# Patient Record
Sex: Female | Born: 1935 | Race: White | Hispanic: No | Smoking: Never smoker
Health system: Southern US, Community
[De-identification: ages and names within clinical notes are randomized; demographics above are authoritative.]

## PROBLEM LIST (undated history)

## (undated) DIAGNOSIS — R350 Frequency of micturition: Secondary | ICD-10-CM

## (undated) DIAGNOSIS — F329 Major depressive disorder, single episode, unspecified: Secondary | ICD-10-CM

## (undated) DIAGNOSIS — M1990 Unspecified osteoarthritis, unspecified site: Secondary | ICD-10-CM

## (undated) DIAGNOSIS — Z87442 Personal history of urinary calculi: Secondary | ICD-10-CM

## (undated) DIAGNOSIS — I639 Cerebral infarction, unspecified: Secondary | ICD-10-CM

## (undated) DIAGNOSIS — F32A Depression, unspecified: Secondary | ICD-10-CM

## (undated) DIAGNOSIS — D649 Anemia, unspecified: Secondary | ICD-10-CM

## (undated) DIAGNOSIS — M797 Fibromyalgia: Secondary | ICD-10-CM

## (undated) DIAGNOSIS — I499 Cardiac arrhythmia, unspecified: Secondary | ICD-10-CM

## (undated) DIAGNOSIS — N200 Calculus of kidney: Secondary | ICD-10-CM

## (undated) DIAGNOSIS — F419 Anxiety disorder, unspecified: Secondary | ICD-10-CM

## (undated) DIAGNOSIS — J189 Pneumonia, unspecified organism: Secondary | ICD-10-CM

## (undated) DIAGNOSIS — T4145XA Adverse effect of unspecified anesthetic, initial encounter: Secondary | ICD-10-CM

## (undated) DIAGNOSIS — R000 Tachycardia, unspecified: Secondary | ICD-10-CM

## (undated) DIAGNOSIS — C50912 Malignant neoplasm of unspecified site of left female breast: Secondary | ICD-10-CM

## (undated) DIAGNOSIS — I4891 Unspecified atrial fibrillation: Secondary | ICD-10-CM

## (undated) DIAGNOSIS — I10 Essential (primary) hypertension: Secondary | ICD-10-CM

## (undated) DIAGNOSIS — T8859XA Other complications of anesthesia, initial encounter: Secondary | ICD-10-CM

## (undated) DIAGNOSIS — R7303 Prediabetes: Secondary | ICD-10-CM

## (undated) DIAGNOSIS — E785 Hyperlipidemia, unspecified: Secondary | ICD-10-CM

## (undated) HISTORY — PX: WRIST SURGERY: SHX841

## (undated) HISTORY — PX: KIDNEY STONE SURGERY: SHX686

## (undated) HISTORY — DX: Hyperlipidemia, unspecified: E78.5

## (undated) HISTORY — PX: COLONOSCOPY: SHX174

---
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
## 1994-10-20 HISTORY — PX: ABDOMINAL HYSTERECTOMY: SHX81

## 1998-07-25 ENCOUNTER — Other Ambulatory Visit: Admission: RE | Admit: 1998-07-25 | Discharge: 1998-07-25 | Payer: Self-pay | Admitting: Cardiology

## 1999-03-15 ENCOUNTER — Emergency Department (HOSPITAL_COMMUNITY): Admission: EM | Admit: 1999-03-15 | Discharge: 1999-03-15 | Payer: Self-pay | Admitting: Emergency Medicine

## 1999-03-15 ENCOUNTER — Encounter: Payer: Self-pay | Admitting: Emergency Medicine

## 1999-03-19 ENCOUNTER — Emergency Department (HOSPITAL_COMMUNITY): Admission: EM | Admit: 1999-03-19 | Discharge: 1999-03-19 | Payer: Self-pay | Admitting: Emergency Medicine

## 1999-04-10 ENCOUNTER — Encounter: Payer: Self-pay | Admitting: Neurosurgery

## 1999-04-10 ENCOUNTER — Ambulatory Visit (HOSPITAL_COMMUNITY): Admission: RE | Admit: 1999-04-10 | Discharge: 1999-04-10 | Payer: Self-pay | Admitting: Neurosurgery

## 1999-07-31 ENCOUNTER — Other Ambulatory Visit: Admission: RE | Admit: 1999-07-31 | Discharge: 1999-07-31 | Payer: Self-pay | Admitting: Cardiology

## 1999-10-01 ENCOUNTER — Ambulatory Visit (HOSPITAL_COMMUNITY): Admission: RE | Admit: 1999-10-01 | Discharge: 1999-10-01 | Payer: Self-pay | Admitting: Neurosurgery

## 1999-10-01 ENCOUNTER — Encounter: Payer: Self-pay | Admitting: Neurosurgery

## 2000-03-25 ENCOUNTER — Ambulatory Visit (HOSPITAL_COMMUNITY): Admission: RE | Admit: 2000-03-25 | Discharge: 2000-03-25 | Payer: Self-pay | Admitting: Neurosurgery

## 2000-03-25 ENCOUNTER — Encounter: Payer: Self-pay | Admitting: Neurosurgery

## 2001-03-29 ENCOUNTER — Encounter: Payer: Self-pay | Admitting: Neurosurgery

## 2001-03-29 ENCOUNTER — Ambulatory Visit (HOSPITAL_COMMUNITY): Admission: RE | Admit: 2001-03-29 | Discharge: 2001-03-29 | Payer: Self-pay | Admitting: Neurosurgery

## 2002-06-06 ENCOUNTER — Ambulatory Visit (HOSPITAL_COMMUNITY): Admission: RE | Admit: 2002-06-06 | Discharge: 2002-06-06 | Payer: Self-pay | Admitting: Neurosurgery

## 2002-06-06 ENCOUNTER — Encounter: Payer: Self-pay | Admitting: Neurosurgery

## 2003-11-09 ENCOUNTER — Ambulatory Visit (HOSPITAL_COMMUNITY): Admission: RE | Admit: 2003-11-09 | Discharge: 2003-11-09 | Payer: Self-pay | Admitting: Gastroenterology

## 2004-11-20 HISTORY — PX: KNEE SURGERY: SHX244

## 2005-02-06 ENCOUNTER — Emergency Department: Payer: Self-pay | Admitting: Unknown Physician Specialty

## 2009-12-11 DIAGNOSIS — D649 Anemia, unspecified: Secondary | ICD-10-CM | POA: Insufficient documentation

## 2009-12-11 DIAGNOSIS — F419 Anxiety disorder, unspecified: Secondary | ICD-10-CM | POA: Insufficient documentation

## 2009-12-11 DIAGNOSIS — J309 Allergic rhinitis, unspecified: Secondary | ICD-10-CM | POA: Insufficient documentation

## 2009-12-11 DIAGNOSIS — K449 Diaphragmatic hernia without obstruction or gangrene: Secondary | ICD-10-CM | POA: Insufficient documentation

## 2009-12-11 DIAGNOSIS — M797 Fibromyalgia: Secondary | ICD-10-CM | POA: Insufficient documentation

## 2009-12-11 DIAGNOSIS — I10 Essential (primary) hypertension: Secondary | ICD-10-CM | POA: Insufficient documentation

## 2010-01-07 DIAGNOSIS — E876 Hypokalemia: Secondary | ICD-10-CM | POA: Insufficient documentation

## 2010-01-07 DIAGNOSIS — R7301 Impaired fasting glucose: Secondary | ICD-10-CM | POA: Insufficient documentation

## 2010-08-01 DIAGNOSIS — Z2839 Other underimmunization status: Secondary | ICD-10-CM | POA: Insufficient documentation

## 2011-12-08 DIAGNOSIS — I480 Paroxysmal atrial fibrillation: Secondary | ICD-10-CM | POA: Insufficient documentation

## 2012-04-26 DIAGNOSIS — E785 Hyperlipidemia, unspecified: Secondary | ICD-10-CM | POA: Insufficient documentation

## 2012-04-26 DIAGNOSIS — R809 Proteinuria, unspecified: Secondary | ICD-10-CM | POA: Insufficient documentation

## 2012-04-26 DIAGNOSIS — M109 Gout, unspecified: Secondary | ICD-10-CM | POA: Insufficient documentation

## 2013-06-21 DIAGNOSIS — M899 Disorder of bone, unspecified: Secondary | ICD-10-CM | POA: Insufficient documentation

## 2015-02-22 DIAGNOSIS — I129 Hypertensive chronic kidney disease with stage 1 through stage 4 chronic kidney disease, or unspecified chronic kidney disease: Secondary | ICD-10-CM | POA: Insufficient documentation

## 2016-05-05 DIAGNOSIS — Z0000 Encounter for general adult medical examination without abnormal findings: Secondary | ICD-10-CM | POA: Insufficient documentation

## 2016-05-07 ENCOUNTER — Other Ambulatory Visit: Payer: Self-pay | Admitting: Radiology

## 2016-05-07 HISTORY — PX: BREAST BIOPSY: SHX20

## 2016-05-12 DIAGNOSIS — D0512 Intraductal carcinoma in situ of left breast: Secondary | ICD-10-CM | POA: Insufficient documentation

## 2016-05-16 ENCOUNTER — Other Ambulatory Visit: Payer: Self-pay | Admitting: General Surgery

## 2016-05-20 ENCOUNTER — Encounter: Payer: Self-pay | Admitting: Hematology and Oncology

## 2016-05-20 ENCOUNTER — Telehealth: Payer: Self-pay | Admitting: Hematology and Oncology

## 2016-05-20 NOTE — Telephone Encounter (Signed)
Appointment with Lindi Adie on 8/10 at 345pm. Patient agreed and made aware to arrive 30 minutes early. Demographics verified. Letter to the referring and mailed to the patient.

## 2016-05-21 ENCOUNTER — Other Ambulatory Visit: Payer: Self-pay

## 2016-05-23 ENCOUNTER — Telehealth: Payer: Self-pay | Admitting: *Deleted

## 2016-05-23 ENCOUNTER — Encounter: Payer: Self-pay | Admitting: Radiation Oncology

## 2016-05-23 NOTE — Progress Notes (Signed)
Location of Breast Cancer: left breast DCIS  Histology per Pathology Report:   05/07/16   Receptor Status: ER(95%), PR (80%), Her2-neu (), Ki-()  Did patient present with symptoms (if so, please note symptoms) or was this found on screening mammography?: screening mammogram  Past/Anticipated interventions by surgeon, if any: plans for left breast radioactive seed localized lumpectomy and possible sentinel node mapping,   Past/Anticipated interventions by medical oncology, if any: has appointment with Dr. Lindi Adie on 05/29/16  Lymphedema issues, if any:  no  Pain issues, if any:  no   OB Gyn history:  Patient was 13 years with first menses, 23 years with birth of first child, she has 2 children, 1 deceased.  She has not used bcp or hormone replacement.  SAFETY ISSUES:  Prior radiation? no  Pacemaker/ICD? no  Possible current pregnancy?no  Is the patient on methotrexate? no  Current Complaints / other details:  Patient is here with her husband.  BP (!) 160/84 (BP Location: Right Arm, Patient Position: Sitting)   Pulse (!) 55   Temp 98.2 F (36.8 C) (Oral)   Ht '5\' 3"'  (1.6 m)   Wt 165 lb (74.8 kg)   SpO2 98%   BMI 29.23 kg/m    Wt Readings from Last 3 Encounters:  05/26/16 165 lb (74.8 kg)      Jacqulyn Liner, RN 05/23/2016,9:03 AM

## 2016-05-23 NOTE — Telephone Encounter (Signed)
Received appt date/time from Andrea.  Mailed new pt packet to pt.  

## 2016-05-26 ENCOUNTER — Ambulatory Visit
Admission: RE | Admit: 2016-05-26 | Discharge: 2016-05-26 | Disposition: A | Discharge status: Home / Self Care | Payer: Medicare Other | Source: Ambulatory Visit | Attending: Radiation Oncology | Admitting: Radiation Oncology

## 2016-05-26 ENCOUNTER — Encounter: Payer: Self-pay | Admitting: Radiation Oncology

## 2016-05-26 VITALS — BP 160/84 | HR 55 | Temp 98.2°F | Ht 63.0 in | Wt 165.0 lb

## 2016-05-26 DIAGNOSIS — Z170 Estrogen receptor positive status [ER+]: Secondary | ICD-10-CM | POA: Insufficient documentation

## 2016-05-26 DIAGNOSIS — I4891 Unspecified atrial fibrillation: Secondary | ICD-10-CM | POA: Insufficient documentation

## 2016-05-26 DIAGNOSIS — C50212 Malignant neoplasm of upper-inner quadrant of left female breast: Secondary | ICD-10-CM

## 2016-05-26 DIAGNOSIS — Z7901 Long term (current) use of anticoagulants: Secondary | ICD-10-CM | POA: Diagnosis not present

## 2016-05-26 HISTORY — DX: Malignant neoplasm of unspecified site of left female breast: C50.912

## 2016-05-26 HISTORY — DX: Unspecified atrial fibrillation: I48.91

## 2016-05-26 NOTE — Progress Notes (Signed)
Radiation Oncology         [phone] ________________________________  Initial Outpatient Consultation  Name: Kathryn Ayala MRN: [id number]  Date: 05/26/2016  DOB: [birth year]  [id number], MD  Jovita Kussmaul, MD   REFERRING PHYSICIAN: Autumn Messing III, MD  DIAGNOSIS: The encounter diagnosis was Malignant neoplasm of upper-inner quadrant of left female breast [facility]).   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 female who had a screening mammogram on 04/30/16 that noted grouped punctate calcifications in the upper inner aspect the left breast. Diagnostic mammogram on 05/02/16 noted grouped calcifications measuring 3.2 cm in the left breast at the 12 o'clock position posterior depth. There was architectural distortion associated with the calcifications. Biopsy of the left breast on 05/07/16 revealed DCIS with calcifications (ER 95% positive, PR 80% positive). The patient and her husband present today to discuss the role of radiation in the management of her disease.  PREVIOUS RADIATION THERAPY: No  PAST MEDICAL HISTORY:  has a past medical history of Atrial fibrillation ([place]) and Breast cancer, left ([place]).    PAST SURGICAL HISTORY: Past Surgical History:  Procedure Laterality Date  . ABDOMINAL HYSTERECTOMY  1996  . APPENDECTOMY  1956  . BREAST BIOPSY Left 05/07/2016  . BREAST BIOPSY Bilateral 1956, 1980  . KNEE SURGERY  11/20/2004    FAMILY HISTORY: family history includes Lung cancer in her brother.  SOCIAL HISTORY:  reports that she has never smoked. She has never used smokeless tobacco. She reports that she does not drink alcohol or use drugs.  ALLERGIES: Codeine; Levaquin [levofloxacin]; and Penicillins  MEDICATIONS:  Current Outpatient Prescriptions  Medication Sig Dispense Refill  . allopurinol (ZYLOPRIM) 100 MG tablet Take 100 mg by mouth daily.   3  . atenolol (TENORMIN) 50 MG tablet Take 50 mg  by mouth 2 (two) times daily.  2  . benazepril (LOTENSIN) 10 MG tablet Take 5 mg by mouth daily.   3  . digoxin (LANOXIN) 0.125 MG tablet Take 0.125 mg by mouth daily.    Marland Kitchen ELIQUIS 5 MG TABS tablet Take 5 mg by mouth 2 (two) times daily.  11  . FLUoxetine (PROZAC) 20 MG capsule Take 20 mg by mouth daily.  2  . rosuvastatin (CRESTOR) 20 MG tablet Take 20 mg by mouth daily.  6  . vitamin C (ASCORBIC ACID) 500 MG tablet Take 500 mg by mouth daily.    . Biotin 10 MG CAPS Take by mouth.    . cholecalciferol (VITAMIN D) 1000 units tablet Take 1,000 Units by mouth daily.    . Omega-3 Fatty Acids (FISH OIL) 1000 MG CAPS Take 2 capsules by mouth daily.     No current facility-administered medications for this encounter.     REVIEW OF SYSTEMS:  A 15 point review of systems is documented in the electronic medical record. This was obtained by the nursing staff. However, I reviewed this with the patient to discuss relevant findings and make appropriate changes.  Pertinent items noted in HPI and remainder of comprehensive ROS otherwise negative.   PHYSICAL EXAM:  height is 5\' 3"  (1.6 m) and weight is 165 lb (74.8 kg). Her oral temperature is 98.2 F (36.8 C). Her blood pressure is 160/84 (abnormal) and her pulse is 55 (abnormal). Her oxygen saturation is 98%.   General: Alert and oriented, in no acute distress HEENT: Head is normocephalic. Extraocular movements are intact. Oropharynx is  clear. Neck: Neck is supple, no palpable cervical or supraclavicular lymphadenopathy. Heart: Regular in rate and rhythm with no murmurs, rubs, or gallops. Chest: Clear to auscultation bilaterally, with no rhonchi, wheezes, or rales. Abdomen: Soft, nontender, nondistended, with no rigidity or guarding. Extremities: No cyanosis or edema. Lymphatics: see Neck Exam Skin: No concerning lesions. Musculoskeletal: symmetric strength and muscle tone throughout. Neurologic: Cranial nerves II through XII are grossly intact. No  obvious focalities. Speech is fluent. Coordination is intact. Psychiatric: Judgment and insight are intact. Affect is appropriate. Breast Exam: She has a faint scar in the UOQ of the left breast from a previous benign biopsy. No dominant mass appreciated in the breast. No nipple discharge or bleeding. Right breast shows a feint scar in the UOQ from a prior benign biopsy.  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 The patient would be a good candidate for breast conservation therapy with a partial mastectomy and likely radiation therapy, given the high grade nature of her disease.  PLAN: I spoke to the patient today regarding her diagnosis and options for treatment. We discussed the equivalence in terms of survival and local failure between mastectomy and breast conservation. We discussed the role of radiation in decreasing local failures in patients who undergo lumpectomy. We discussed the process of simulation and the placement tattoos. We discussed 4-6 weeks of treatment as an outpatient. We discussed the possibility of asymptomatic lung damage. We discussed the low likelihood of secondary malignancies. We discussed the possible side effects including but not limited to skin redness, fatigue, permanent skin darkening, and breast swelling. The patient is interested in breast conservation surgery rather than a mastectomy.  The patient is scheduled to see Dr. Lindi Adie on 05/29/16 and she will get cardiac clearance from her cardiologist. The patient will proceed with surgery and will be seen in a post-operative setting to discuss treatment recommendations.     ------------------------------------------------  Blair Promise, PhD, MD  This document serves  as a record of services personally performed by Gery Pray, MD. It was created on his behalf by Darcus Austin, a trained medical scribe. The creation of this record is based on the scribe's personal observations and the provider's statements to them. This document has been checked and approved by the attending provider.

## 2016-05-26 NOTE — Addendum Note (Signed)
Encounter addended by: Jacqulyn Liner, RN on: 05/26/2016  1:30 PM<BR>    Actions taken: Charge Capture section accepted

## 2016-05-26 NOTE — Progress Notes (Signed)
Please see the Nurse Progress Note in the MD Initial Consult Encounter for this patient. 

## 2016-05-29 ENCOUNTER — Encounter: Payer: Self-pay | Admitting: *Deleted

## 2016-05-29 ENCOUNTER — Ambulatory Visit (HOSPITAL_BASED_OUTPATIENT_CLINIC_OR_DEPARTMENT_OTHER): Payer: Medicare Other | Admitting: Hematology and Oncology

## 2016-05-29 ENCOUNTER — Encounter: Payer: Self-pay | Admitting: Hematology and Oncology

## 2016-05-29 DIAGNOSIS — Z170 Estrogen receptor positive status [ER+]: Secondary | ICD-10-CM | POA: Diagnosis not present

## 2016-05-29 DIAGNOSIS — D0512 Intraductal carcinoma in situ of left breast: Secondary | ICD-10-CM | POA: Diagnosis not present

## 2016-05-29 DIAGNOSIS — C50212 Malignant neoplasm of upper-inner quadrant of left female breast: Secondary | ICD-10-CM

## 2016-05-29 NOTE — Assessment & Plan Note (Signed)
Left breast biopsy 05/07/2016: DCIS with calcifications, high-grade, ER 95%, PR 80%, Tis N0 stage 0 There are 3.2 cm grouped calcifications in the left breast at 12:00 posterior depth. There is architectural distortion.  Pathology review: I discussed with the patient the difference between DCIS and invasive breast cancer. It is considered a precancerous lesion. DCIS is classified as a 0. It is generally detected through mammograms as calcifications. We discussed the significance of grades and its impact on prognosis. We also discussed the importance of ER and PR receptors and their implications to adjuvant treatment options. Prognosis of DCIS dependence on grade, comedo necrosis. It is anticipated that if not treated, 20-30% of DCIS can develop into invasive breast cancer.  Recommendation: 1. Breast conserving surgery 2. Followed by adjuvant radiation therapy 3. Followed by antiestrogen therapy with tamoxifen 5 years  Tamoxifen counseling: We discussed the risks and benefits of tamoxifen. These include but not limited to insomnia, hot flashes, mood changes, vaginal dryness, and weight gain. Although rare, serious side effects including endometrial cancer, risk of blood clots were also discussed. We strongly believe that the benefits far outweigh the risks. Patient understands these risks and consented to starting treatment. Planned treatment duration is 5 years.  Return to clinic after surgery to discuss the final pathology report and come up with an adjuvant treatment plan.

## 2016-05-29 NOTE — Progress Notes (Signed)
[facility] CONSULT NOTE  Patient Care Team: Crist Infante, MD as PCP - General (Internal Medicine)  CHIEF COMPLAINTS/PURPOSE OF CONSULTATION:  Newly diagnosed left breast DCIS  HISTORY OF PRESENTING ILLNESS:  Kathryn Ayala Alias 81 y.o. female is here because of recent diagnosis of left breast DCIS. The patient had a routine screening mammogram the detected abnormality in the left breast is 3.2 cm grouped calcifications with architectural distortion. This was evaluated by ultrasound and biopsy was performed. The result came back as DCIS with calcifications high-grade that was ER/PR positive. She was seen by Dr. Marlou Starks who is scheduling her for surgery. She was sent to [place] for discussion regarding adjuvant treatment options. She is here today accompanied by her husband and her niece.  I reviewed her records extensively and collaborated the history with the patient.  SUMMARY OF ONCOLOGIC HISTORY:   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[place])   05/02/2016 Mammogram    There are 3.2 cm grouped calcifications in the left breast at 12:00 posterior depth. There is architectural distortion.     05/07/2016 Initial Diagnosis    Left breast biopsy: DCIS with calcifications, high-grade, ER 95%, PR 80%, Tis N0 stage 0      MEDICAL HISTORY:  Past Medical History:  Diagnosis Date  . Atrial fibrillation ([place])   . Breast cancer, left [facility])     SURGICAL HISTORY: Past Surgical History:  Procedure Laterality Date  . ABDOMINAL HYSTERECTOMY  1996  . APPENDECTOMY  1956  . BREAST BIOPSY Left 05/07/2016  . BREAST BIOPSY Bilateral 1956, 1980  . KNEE SURGERY  11/20/2004    SOCIAL HISTORY: Social History   Social History  . Marital status: Married    Spouse name: N/A  . Number of children: 2  . Years of education: N/A   Occupational History  . Not on file.   Social History Main Topics  . Smoking status: Never Smoker  . Smokeless tobacco: Never Used  .  Alcohol use No  . Drug use: No  . Sexual activity: Not on file   Other Topics Concern  . Not on file   Social History Narrative  . No narrative on file    FAMILY HISTORY: Family History  Problem Relation Age of Onset  . Lung cancer Brother     ALLERGIES:  is allergic to codeine; levaquin [levofloxacin]; and penicillins.  MEDICATIONS:  Current Outpatient Prescriptions  Medication Sig Dispense Refill  . allopurinol (ZYLOPRIM) 100 MG tablet Take 100 mg by mouth daily.   3  . atenolol (TENORMIN) 50 MG tablet Take 50 mg by mouth 2 (two) times daily.  2  . benazepril (LOTENSIN) 10 MG tablet Take 5 mg by mouth daily.   3  . Biotin 10 MG CAPS Take by mouth.    . cholecalciferol (VITAMIN D) 1000 units tablet Take 1,000 Units by mouth daily.    . digoxin (LANOXIN) 0.125 MG tablet Take 0.125 mg by mouth daily.    Marland Kitchen ELIQUIS 5 MG TABS tablet Take 5 mg by mouth 2 (two) times daily.  11  . FLUoxetine (PROZAC) 20 MG capsule Take 20 mg by mouth daily.  2  . Omega-3 Fatty Acids (FISH OIL) 1000 MG CAPS Take 2 capsules by mouth daily.    . rosuvastatin (CRESTOR) 20 MG tablet Take 20 mg by mouth daily.  6  . vitamin C (ASCORBIC ACID) 500 MG tablet Take 500 mg by mouth daily.     No  current facility-administered medications for this visit.     REVIEW OF SYSTEMS:   Constitutional: Denies fevers, chills or abnormal night sweats Eyes: Denies blurriness of vision, double vision or watery eyes Ears, nose, mouth, throat, and face: Denies mucositis or sore throat Respiratory: Denies cough, dyspnea or wheezes Cardiovascular: Denies palpitation, chest discomfort or lower extremity swelling Gastrointestinal:  Denies nausea, heartburn or change in bowel habits Skin: Denies abnormal skin rashes Lymphatics: Denies new lymphadenopathy or easy bruising Neurological:Denies numbness, tingling or new weaknesses Behavioral/Psych: Mood is stable, no new changes  Breast:  Denies any palpable lumps or  discharge All other systems were reviewed with the patient and are negative.  PHYSICAL EXAMINATION: ECOG PERFORMANCE STATUS: 0 - Asymptomatic  There were no vitals filed for this visit. There were no vitals filed for this visit.  GENERAL:alert, no distress and comfortable SKIN: skin color, texture, turgor are normal, no rashes or significant lesions EYES: normal, conjunctiva are pink and non-injected, sclera clear OROPHARYNX:no exudate, no erythema and lips, buccal mucosa, and tongue normal  NECK: supple, thyroid normal size, non-tender, without nodularity LYMPH:  no palpable lymphadenopathy in the cervical, axillary or inguinal LUNGS: clear to auscultation and percussion with normal breathing effort HEART: regular rate & rhythm and no murmurs and no lower extremity edema ABDOMEN:abdomen soft, non-tender and normal bowel sounds Musculoskeletal:no cyanosis of digits and no clubbing  PSYCH: alert & oriented x 3 with fluent speech NEURO: no focal motor/sensory deficits BREAST: No palpable nodules in breast. No palpable axillary or supraclavicular lymphadenopathy (exam performed in the presence of a chaperone)   RADIOGRAPHIC STUDIES: I have personally reviewed the radiological reports and agreed with the findings in the report.  ASSESSMENT AND PLAN:  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[place]) Left breast biopsy 05/07/2016: DCIS with calcifications, high-grade, ER 95%, PR 80%, Tis N0 stage 0 There are 3.2 cm grouped calcifications in the left breast at 12:00 posterior depth. There is architectural distortion.  Pathology review: I discussed with the patient the difference between DCIS and invasive breast cancer. It is considered a precancerous lesion. DCIS is classified as a 0. It is generally detected through mammograms as calcifications. We discussed the significance of grades and its impact on prognosis. We also discussed the importance of ER and PR receptors  and their implications to adjuvant treatment options. Prognosis of DCIS dependence on grade, comedo necrosis. It is anticipated that if not treated, 20-30% of DCIS can develop into invasive breast cancer.  Recommendation: 1. Breast conserving surgery 2. +/- adjuvant radiation therapy 3. +/- antiestrogen therapy with tamoxifen 5 years  Tamoxifen counseling: We discussed the risks and benefits of tamoxifen. These include but not limited to insomnia, hot flashes, mood changes, vaginal dryness, and weight gain. Although rare, serious side effects including  risk of blood clots were also discussed. W  Prognosis: With surgery alone the risk of recurrence is a 10% over 10 years. With both radiation and hormonal therapy the risk would be 2%. With radiation alone the risk would be 4%. With hormone therapy alone the risk would be 5%. Once she completed surgery then we will discuss the pros and cons of doing either radiation or hormone therapy. I anticipate that she may decide to receive one of these 2 treatments and not both.  Return to clinic after surgery to discuss the final pathology report and come up with an adjuvant treatment plan.  All questions were answered. The patient knows to call the clinic with  any problems, questions or concerns.    Rulon Eisenmenger, MD 05/29/16

## 2016-06-03 ENCOUNTER — Telehealth: Payer: Self-pay | Admitting: Hematology and Oncology

## 2016-06-03 NOTE — Telephone Encounter (Signed)
Spoke with pt to confirm 9/11 appt at 11 am per [facility] LOS

## 2016-06-10 ENCOUNTER — Encounter (HOSPITAL_COMMUNITY)
Admission: RE | Admit: 2016-06-10 | Discharge: 2016-06-10 | Disposition: A | Discharge status: Home / Self Care | Payer: Medicare Other | Source: Ambulatory Visit | Attending: General Surgery | Admitting: General Surgery

## 2016-06-10 ENCOUNTER — Encounter (HOSPITAL_COMMUNITY): Payer: Self-pay

## 2016-06-10 ENCOUNTER — Other Ambulatory Visit (HOSPITAL_COMMUNITY): Payer: Self-pay | Admitting: *Deleted

## 2016-06-10 DIAGNOSIS — D0512 Intraductal carcinoma in situ of left breast: Secondary | ICD-10-CM | POA: Diagnosis not present

## 2016-06-10 DIAGNOSIS — Z01812 Encounter for preprocedural laboratory examination: Secondary | ICD-10-CM | POA: Diagnosis present

## 2016-06-10 HISTORY — DX: Anxiety disorder, unspecified: F41.9

## 2016-06-10 HISTORY — DX: Cerebral infarction, unspecified: I63.9

## 2016-06-10 HISTORY — DX: Frequency of micturition: R35.0

## 2016-06-10 HISTORY — DX: Adverse effect of unspecified anesthetic, initial encounter: T41.45XA

## 2016-06-10 HISTORY — DX: Other complications of anesthesia, initial encounter: T88.59XA

## 2016-06-10 HISTORY — DX: Anemia, unspecified: D64.9

## 2016-06-10 HISTORY — DX: Major depressive disorder, single episode, unspecified: F32.9

## 2016-06-10 HISTORY — DX: Pneumonia, unspecified organism: J18.9

## 2016-06-10 HISTORY — DX: Unspecified osteoarthritis, unspecified site: M19.90

## 2016-06-10 HISTORY — DX: Depression, unspecified: F32.A

## 2016-06-10 HISTORY — DX: Essential (primary) hypertension: I10

## 2016-06-10 HISTORY — DX: Calculus of kidney: N20.0

## 2016-06-10 LAB — BASIC METABOLIC PANEL
Anion gap: 8 (ref 5–15)
BUN: 16 mg/dL (ref 6–20)
CHLORIDE: 108 mmol/L (ref 101–111)
CO2: 22 mmol/L (ref 22–32)
CREATININE: 0.84 mg/dL (ref 0.44–1.00)
Calcium: 9.7 mg/dL (ref 8.9–10.3)
GFR calc Af Amer: >60 mL/min (ref >60)
GFR calc non Af Amer: >60 mL/min (ref >60)
GLUCOSE: 122 mg/dL — AB (ref 65–99)
POTASSIUM: 3.7 mmol/L (ref 3.5–5.1)
SODIUM: 138 mmol/L (ref 135–145)

## 2016-06-10 LAB — CBC
HEMATOCRIT: 44.9 % (ref 36.0–46.0)
Hemoglobin: 14.2 g/dL (ref 12.0–15.0)
MCH: 29.7 pg (ref 26.0–34.0)
MCHC: 31.6 g/dL (ref 30.0–36.0)
MCV: 93.9 fL (ref 78.0–100.0)
PLATELETS: 175 10*3/uL (ref 150–400)
RBC: 4.78 MIL/uL (ref 3.87–5.11)
RDW: 14.8 % (ref 11.5–15.5)
WBC: 6.7 10*3/uL (ref 4.0–10.5)

## 2016-06-10 NOTE — Pre-Procedure Instructions (Signed)
Kathryn Ayala  06/10/2016     Your procedure is scheduled on Monday, June 16, 2016 at 10:05 AM.   Report to [facility] Entrance "A" Admitting Office at 8:00 AM.   Call this number if you have problems the morning of surgery: [phone]   Questions prior to day of surgery, please call [phone] between 8 & 4 PM.   Remember:  Do not eat food or drink liquids after midnight Sunday, 06/15/16.  Take these medicines the morning of surgery with A SIP OF WATER: Atenolol (Tenormin), Digoxin (Lanoxin), Fluoxetine (Prozac)  Stop Eliquis as instructed by physician.   Do not wear jewelry, make-up or nail polish.  Do not wear lotions, powders, or perfumes.  You may NOT wear deodorant.  Do not shave 48 hours prior to surgery.   Do not bring valuables to the hospital.  [facility] is not responsible for any belongings or valuables.  Contacts, dentures or bridgework may not be worn into surgery.  Leave your suitcase in the car.  After surgery it may be brought to your room.  For patients admitted to the hospital, discharge time will be determined by your treatment team.  Patients discharged the day of surgery will not be allowed to drive home.   Special instructions:  [place] - Preparing for Surgery  Before surgery, you can play an important role.  Because skin is not sterile, your skin needs to be as free of germs as possible.  You can reduce the number of germs on you skin by washing with CHG (chlorahexidine gluconate) soap before surgery.  CHG is an antiseptic cleaner which kills germs and bonds with the skin to continue killing germs even after washing.  Please DO NOT use if you have an allergy to CHG or antibacterial soaps.  If your skin becomes reddened/irritated stop using the CHG and inform your nurse when you arrive at Short Stay.  Do not shave (including legs and underarms) for at least 48 hours prior to the first CHG shower.  You may shave your face.  Please  follow these instructions carefully:   1.  Shower with CHG Soap the night before surgery and the                    morning of Surgery.  2.  If you choose to wash your hair, wash your hair first as usual with your       normal shampoo.  3.  After you shampoo, rinse your hair and body thoroughly to remove the shampoo.  4.  Use CHG as you would any other liquid soap.  You can apply chg directly       to the skin and wash gently with scrungie or a clean washcloth.  5.  Apply the CHG Soap to your body ONLY FROM THE NECK DOWN.        Do not use on open wounds or open sores.  Avoid contact with your eyes, ears, mouth and genitals (private parts).  Wash genitals (private parts) with your normal soap.  6.  Wash thoroughly, paying special attention to the area where your surgery        will be performed.  7.  Thoroughly rinse your body with warm water from the neck down.  8.  DO NOT shower/wash with your normal soap after using and rinsing off       the CHG Soap.  9.  Pat yourself dry with a  clean towel.            10.  Wear clean pajamas.            11.  Place clean sheets on your bed the night of your first shower and do not        sleep with pets.  Day of Surgery  Do not apply any lotions/deodorants the morning of surgery.  Please wear clean clothes to the hospital.   Please read over the following fact sheets that you were given. Pain Booklet, Coughing and Deep Breathing and Surgical Site Infection Prevention

## 2016-06-10 NOTE — Progress Notes (Signed)
Pt has hx of A-fib, cardiologist is Dr. Einar Gip. She denies any recent chest pain or sob. Have requested EKG and last office visit notes from Dr. Irven Shelling office. No stress test or echo has been done per office staff.

## 2016-06-16 ENCOUNTER — Ambulatory Visit (HOSPITAL_COMMUNITY): Payer: Medicare Other | Admitting: Certified Registered"

## 2016-06-16 ENCOUNTER — Encounter (HOSPITAL_COMMUNITY)
Admission: RE | Disposition: A | Discharge status: Home / Self Care | Payer: Self-pay | Source: Ambulatory Visit | Attending: General Surgery

## 2016-06-16 ENCOUNTER — Ambulatory Visit (HOSPITAL_COMMUNITY): Payer: Medicare Other | Admitting: Vascular Surgery

## 2016-06-16 ENCOUNTER — Ambulatory Visit (HOSPITAL_COMMUNITY)
Admission: RE | Admit: 2016-06-16 | Discharge: 2016-06-16 | Disposition: A | Discharge status: Home / Self Care | Payer: Medicare Other | Source: Ambulatory Visit | Attending: General Surgery | Admitting: General Surgery

## 2016-06-16 ENCOUNTER — Encounter (HOSPITAL_COMMUNITY): Payer: Self-pay | Admitting: *Deleted

## 2016-06-16 DIAGNOSIS — Z79899 Other long term (current) drug therapy: Secondary | ICD-10-CM | POA: Diagnosis not present

## 2016-06-16 DIAGNOSIS — I4891 Unspecified atrial fibrillation: Secondary | ICD-10-CM | POA: Insufficient documentation

## 2016-06-16 DIAGNOSIS — N6092 Unspecified benign mammary dysplasia of left breast: Secondary | ICD-10-CM | POA: Diagnosis not present

## 2016-06-16 DIAGNOSIS — Z7901 Long term (current) use of anticoagulants: Secondary | ICD-10-CM | POA: Insufficient documentation

## 2016-06-16 DIAGNOSIS — D0512 Intraductal carcinoma in situ of left breast: Secondary | ICD-10-CM | POA: Diagnosis present

## 2016-06-16 DIAGNOSIS — I10 Essential (primary) hypertension: Secondary | ICD-10-CM | POA: Insufficient documentation

## 2016-06-16 HISTORY — PX: BREAST LUMPECTOMY WITH RADIOACTIVE SEED LOCALIZATION: SHX6424

## 2016-06-16 LAB — PROTIME-INR
INR: 1.04
Prothrombin Time: 13.7 seconds (ref 11.4–15.2)

## 2016-06-16 SURGERY — BREAST LUMPECTOMY WITH RADIOACTIVE SEED LOCALIZATION
Anesthesia: General | Site: Breast | Laterality: Left

## 2016-06-16 MED ORDER — METOCLOPRAMIDE HCL 5 MG/ML IJ SOLN: 10.0000 mg | Freq: Once | INTRAMUSCULAR | Status: DC | PRN

## 2016-06-16 MED ORDER — DEXAMETHASONE SODIUM PHOSPHATE 4 MG/ML IJ SOLN
INTRAMUSCULAR | Status: DC | PRN
  Administered 2016-06-16: 10 mg via INTRAVENOUS

## 2016-06-16 MED ORDER — EPHEDRINE SULFATE 50 MG/ML IJ SOLN
INTRAMUSCULAR | Status: DC | PRN
  Administered 2016-06-16: 5 mg via INTRAVENOUS

## 2016-06-16 MED ORDER — CHLORHEXIDINE GLUCONATE CLOTH 2 % EX PADS: 6.0000 | MEDICATED_PAD | Freq: Once | CUTANEOUS | Status: DC

## 2016-06-16 MED ORDER — FENTANYL CITRATE (PF) 100 MCG/2ML IJ SOLN
INTRAMUSCULAR | Status: AC
  Filled 2016-06-16: qty 2

## 2016-06-16 MED ORDER — EPHEDRINE 5 MG/ML INJ
INTRAVENOUS | Status: AC
  Filled 2016-06-16: qty 10

## 2016-06-16 MED ORDER — LACTATED RINGERS IV SOLN
INTRAVENOUS | Status: DC
  Administered 2016-06-16 (×2): via INTRAVENOUS

## 2016-06-16 MED ORDER — ONDANSETRON HCL 4 MG/2ML IJ SOLN
INTRAMUSCULAR | Status: DC | PRN
  Administered 2016-06-16: 4 mg via INTRAVENOUS

## 2016-06-16 MED ORDER — DEXAMETHASONE SODIUM PHOSPHATE 10 MG/ML IJ SOLN
INTRAMUSCULAR | Status: AC
  Filled 2016-06-16: qty 1

## 2016-06-16 MED ORDER — GLYCOPYRROLATE 0.2 MG/ML IV SOSY
PREFILLED_SYRINGE | INTRAVENOUS | Status: AC
  Filled 2016-06-16: qty 3

## 2016-06-16 MED ORDER — GLYCOPYRROLATE 0.2 MG/ML IJ SOLN
INTRAMUSCULAR | Status: DC | PRN
  Administered 2016-06-16: 0.2 mg via INTRAVENOUS

## 2016-06-16 MED ORDER — 0.9 % SODIUM CHLORIDE (POUR BTL) OPTIME
TOPICAL | Status: DC | PRN
  Administered 2016-06-16: 1000 mL

## 2016-06-16 MED ORDER — PROPOFOL 10 MG/ML IV BOLUS
INTRAVENOUS | Status: DC | PRN
  Administered 2016-06-16: 130 mg via INTRAVENOUS

## 2016-06-16 MED ORDER — ONDANSETRON HCL 4 MG/2ML IJ SOLN
INTRAMUSCULAR | Status: AC
  Filled 2016-06-16: qty 2

## 2016-06-16 MED ORDER — PROPOFOL 10 MG/ML IV BOLUS
INTRAVENOUS | Status: AC
  Filled 2016-06-16: qty 20

## 2016-06-16 MED ORDER — BUPIVACAINE-EPINEPHRINE 0.25% -1:200000 IJ SOLN
INTRAMUSCULAR | Status: DC | PRN
  Administered 2016-06-16: 10 mL

## 2016-06-16 MED ORDER — FENTANYL CITRATE (PF) 100 MCG/2ML IJ SOLN
25.0000 ug | INTRAMUSCULAR | Status: DC | PRN
  Administered 2016-06-16: 50 ug via INTRAVENOUS

## 2016-06-16 MED ORDER — FENTANYL CITRATE (PF) 100 MCG/2ML IJ SOLN
INTRAMUSCULAR | Status: DC | PRN
Start: 2016-06-16 — End: 2016-06-16
  Administered 2016-06-16 (×4): 50 ug via INTRAVENOUS

## 2016-06-16 MED ORDER — VANCOMYCIN HCL IN DEXTROSE 1-5 GM/200ML-% IV SOLN
INTRAVENOUS | Status: AC
  Filled 2016-06-16: qty 200

## 2016-06-16 MED ORDER — VANCOMYCIN HCL 1000 MG IV SOLR
INTRAVENOUS | Status: DC | PRN
  Administered 2016-06-16: 1000 mg via INTRAVENOUS

## 2016-06-16 MED ORDER — TRAMADOL HCL 50 MG PO TABS: 50.0000 mg | ORAL_TABLET | Freq: Four times a day (QID) | ORAL | 0 refills | Status: DC | PRN

## 2016-06-16 MED ORDER — BUPIVACAINE-EPINEPHRINE (PF) 0.25% -1:200000 IJ SOLN
INTRAMUSCULAR | Status: AC
  Filled 2016-06-16: qty 30

## 2016-06-16 MED ORDER — HYDROCODONE-ACETAMINOPHEN 5-325 MG PO TABS: 1.0000 | ORAL_TABLET | ORAL | 0 refills | Status: DC | PRN

## 2016-06-16 MED ORDER — PHENYLEPHRINE 40 MCG/ML (10ML) SYRINGE FOR IV PUSH (FOR BLOOD PRESSURE SUPPORT)
PREFILLED_SYRINGE | INTRAVENOUS | Status: AC
  Filled 2016-06-16: qty 20

## 2016-06-16 MED ORDER — MIDAZOLAM HCL 2 MG/2ML IJ SOLN
INTRAMUSCULAR | Status: AC
  Filled 2016-06-16: qty 2

## 2016-06-16 MED ORDER — LIDOCAINE HCL (CARDIAC) 20 MG/ML IV SOLN
INTRAVENOUS | Status: DC | PRN
  Administered 2016-06-16: 100 mg via INTRAVENOUS

## 2016-06-16 MED ORDER — MEPERIDINE HCL 25 MG/ML IJ SOLN: 6.2500 mg | INTRAMUSCULAR | Status: DC | PRN

## 2016-06-16 MED ORDER — CEFAZOLIN SODIUM-DEXTROSE 2-4 GM/100ML-% IV SOLN
INTRAVENOUS | Status: AC
  Filled 2016-06-16: qty 100

## 2016-06-16 MED ORDER — PHENYLEPHRINE HCL 10 MG/ML IJ SOLN
INTRAMUSCULAR | Status: DC | PRN
  Administered 2016-06-16: 120 ug via INTRAVENOUS
  Administered 2016-06-16 (×2): 80 ug via INTRAVENOUS

## 2016-06-16 SURGICAL SUPPLY — 41 items
APPLIER CLIP 9.375 MED OPEN (MISCELLANEOUS)
BINDER BREAST LRG (GAUZE/BANDAGES/DRESSINGS) IMPLANT
BINDER BREAST XLRG (GAUZE/BANDAGES/DRESSINGS) IMPLANT
BLADE SURG 15 STRL LF DISP TIS (BLADE) ×1 IMPLANT
BLADE SURG 15 STRL SS (BLADE) ×1
CANISTER SUCTION 2500CC (MISCELLANEOUS) ×2 IMPLANT
CHLORAPREP W/TINT 26ML (MISCELLANEOUS) ×2 IMPLANT
CLIP APPLIE 9.375 MED OPEN (MISCELLANEOUS) IMPLANT
COVER PROBE W GEL 5X96 (DRAPES) ×2 IMPLANT
COVER SURGICAL LIGHT HANDLE (MISCELLANEOUS) ×2 IMPLANT
DEVICE DUBIN SPECIMEN MAMMOGRA (MISCELLANEOUS) ×2 IMPLANT
DRAPE CHEST BREAST 15X10 FENES (DRAPES) ×2 IMPLANT
DRAPE UTILITY XL STRL (DRAPES) ×2 IMPLANT
ELECT COATED BLADE 2.86 ST (ELECTRODE) ×2 IMPLANT
ELECT REM PT RETURN 9FT ADLT (ELECTROSURGICAL) ×2
ELECTRODE REM PT RTRN 9FT ADLT (ELECTROSURGICAL) ×1 IMPLANT
GLOVE BIO SURGEON STRL SZ 6 (GLOVE) ×2 IMPLANT
GLOVE BIO SURGEON STRL SZ 6.5 (GLOVE) ×4 IMPLANT
GLOVE BIO SURGEON STRL SZ7.5 (GLOVE) ×2 IMPLANT
GLOVE BIOGEL PI IND STRL 7.0 (GLOVE) ×2 IMPLANT
GLOVE BIOGEL PI INDICATOR 7.0 (GLOVE) ×2
GLOVE ECLIPSE 6.0 STRL STRAW (GLOVE) ×2 IMPLANT
GOWN STRL REUS W/ TWL LRG LVL3 (GOWN DISPOSABLE) ×2 IMPLANT
GOWN STRL REUS W/TWL LRG LVL3 (GOWN DISPOSABLE) ×2
KIT BASIN OR (CUSTOM PROCEDURE TRAY) ×2 IMPLANT
KIT MARKER MARGIN INK (KITS) ×2 IMPLANT
LIQUID BAND (GAUZE/BANDAGES/DRESSINGS) ×2 IMPLANT
NEEDLE HYPO 25X1 1.5 SAFETY (NEEDLE) ×2 IMPLANT
NS IRRIG 1000ML POUR BTL (IV SOLUTION) ×2 IMPLANT
PACK SURGICAL SETUP 50X90 (CUSTOM PROCEDURE TRAY) ×2 IMPLANT
PENCIL BUTTON HOLSTER BLD 10FT (ELECTRODE) ×2 IMPLANT
SPONGE LAP 18X18 X RAY DECT (DISPOSABLE) ×2 IMPLANT
SUT MNCRL AB 4-0 PS2 18 (SUTURE) ×4 IMPLANT
SUT SILK 2 0 SH (SUTURE) IMPLANT
SUT VIC AB 3-0 SH 18 (SUTURE) ×2 IMPLANT
SYR BULB 3OZ (MISCELLANEOUS) ×2 IMPLANT
SYR CONTROL 10ML LL (SYRINGE) ×2 IMPLANT
TOWEL OR 17X24 6PK STRL BLUE (TOWEL DISPOSABLE) ×2 IMPLANT
TOWEL OR 17X26 10 PK STRL BLUE (TOWEL DISPOSABLE) ×2 IMPLANT
TUBE CONNECTING 12X1/4 (SUCTIONS) ×2 IMPLANT
YANKAUER SUCT BULB TIP NO VENT (SUCTIONS) ×2 IMPLANT

## 2016-06-16 NOTE — Anesthesia Procedure Notes (Signed)
Procedure Name: LMA Insertion Date/Time: 06/16/2016 9:51 AM Performed by: Huey Romans ANN Pre-anesthesia Checklist: Patient identified, Emergency Drugs available, Suction available and Patient being monitored Patient Re-evaluated:Patient Re-evaluated prior to inductionOxygen Delivery Method: Circle System Utilized Preoxygenation: Pre-oxygenation with 100% oxygen Intubation Type: IV induction Ventilation: Mask ventilation without difficulty LMA: LMA inserted LMA Size: 4.0 Number of attempts: 1 Airway Equipment and Method: Bite block Placement Confirmation: positive ETCO2 Tube secured with: Tape Dental Injury: Teeth and Oropharynx as per pre-operative assessment

## 2016-06-16 NOTE — Anesthesia Preprocedure Evaluation (Signed)
Anesthesia Evaluation  Patient identified by MRN, date of birth, ID band Patient awake    Reviewed: Allergy & Precautions, NPO status , Patient's Chart, lab work & pertinent test results  Airway Mallampati: II  TM Distance: >3 FB Neck ROM: Full    Dental no notable dental hx.    Pulmonary neg pulmonary ROS,    Pulmonary exam normal breath sounds clear to auscultation       Cardiovascular hypertension, Pt. on medications and Pt. on home beta blockers Normal cardiovascular exam+ dysrhythmias Atrial Fibrillation  Rhythm:Regular Rate:Normal     Neuro/Psych CVA negative neurological ROS  negative psych ROS   GI/Hepatic negative GI ROS, Neg liver ROS,   Endo/Other  negative endocrine ROS  Renal/GU negative Renal ROS  negative genitourinary   Musculoskeletal negative musculoskeletal ROS (+)   Abdominal   Peds negative pediatric ROS (+)  Hematology negative hematology ROS (+)   Anesthesia Other Findings   Reproductive/Obstetrics negative OB ROS                            Anesthesia Physical Anesthesia Plan  ASA: III  Anesthesia Plan: General   Post-op Pain Management:    Induction: Intravenous  Airway Management Planned: Oral ETT and LMA  Additional Equipment:   Intra-op Plan:   Post-operative Plan: Extubation in OR  Informed Consent: I have reviewed the patients History and Physical, chart, labs and discussed the procedure including the risks, benefits and alternatives for the proposed anesthesia with the patient or authorized representative who has indicated his/her understanding and acceptance.   Dental advisory given  Plan Discussed with: CRNA  Anesthesia Plan Comments:         Anesthesia Quick Evaluation

## 2016-06-16 NOTE — Transfer of Care (Signed)
Immediate Anesthesia Transfer of Care Note  Patient: Kathryn Ayala  Procedure(s) Performed: Procedure(s): LEFT BREAST LUMPECTOMY WITH RADIOACTIVE SEED LOCALIZATION (Left)  Patient Location: PACU  Anesthesia Type:General  Level of Consciousness: awake, alert  and oriented  Airway & Oxygen Therapy: Patient Spontanous Breathing and Patient connected to face mask oxygen  Post-op Assessment: Report given to RN and Post -op Vital signs reviewed and stable  Post vital signs: Reviewed and stable  Last Vitals:  Vitals:   06/16/16 0859  BP: (!) 171/67  Pulse: (!) 56  Resp: 18  Temp: 36.8 C    Last Pain:  Vitals:   06/16/16 0859  TempSrc: Oral      Patients Stated Pain Goal: 3 ([id number])  Complications: No apparent anesthesia complications

## 2016-06-16 NOTE — Op Note (Signed)
06/16/2016  10:42 AM  PATIENT:  Kathryn Ayala  81 y.o. female  PRE-OPERATIVE DIAGNOSIS:  LEFT BREAST DCIS  POST-OPERATIVE DIAGNOSIS:  LEFT BREAST DCIS  PROCEDURE:  Procedure(s): LEFT BREAST LUMPECTOMY WITH RADIOACTIVE SEED LOCALIZATION (Left)  SURGEON:  Surgeon(s) and Role:    * Jovita Kussmaul, MD - Primary    * Clovis Riley, MD - Assisting  PHYSICIAN ASSISTANT:   ASSISTANTS: Dr. Kae Heller   ANESTHESIA:   general  EBL:  Total I/O In: -  Out: 5 [Blood:5]  BLOOD ADMINISTERED:none  DRAINS: none   LOCAL MEDICATIONS USED:  MARCAINE     SPECIMEN:  Source of Specimen:  left breast tissue  DISPOSITION OF SPECIMEN:  PATHOLOGY  COUNTS:  YES  TOURNIQUET:  * No tourniquets in log *  DICTATION: .Dragon Dictation   After informed consent was obtained the patient was brought to the operating room and placed in the supine position on the operating room table. After adequate induction of general anesthesia the patient's left breast was prepped with ChloraPrep, allowed to dry, and draped in usual sterile manner. An appropriate timeout was performed. Previously an I-125 seed was placed in the upper inner quadrant of the left breast to mark an area of ductal carcinoma in situ. The neoprobe was set to I-125 in the area of radioactivity was readily identified in the upper portion of the left breast. An elliptical incision was made with a 15 blade knife overlying the area of radioactivity. The incision was carried through the skin and subcutaneous tissue sharply with the electrocautery. While checking the area of radioactivity frequently with the neoprobe a circular portion of breast tissue was excised sharply around the radioactive seed. This dissection was carried all the way to the chest wall.Once the specimen was removed it was oriented with the appropriate paint colors. Of note the blue paint was placed on the superior surface and the red paint was placed on the inferior surface. A specimen  radiograph was obtained that showed the clip and seed to be in the center of the specimen with the calcifications. The specimen was then sent to pathology for further evaluation. Hemostasis was achieved using the Bovie electrocautery. An additional inferior margin was taken and marked with a stitch on the new true surgical margin. This was done for some abnormal feeling tissue at the inferior margin. The cavity was then marked with clips. The wound was irrigated with copious amounts of saline and infiltrated with 0.25% percent Marcaine. The deep layer of the wound Was then closed with interrupted 3-0 Vicryl stitches. The skin was closed with interrupted 4-0 Monocryl subcuticular stitches. Dermabond dressings were applied. The patient tolerated the procedure well. At the end of the case all needle sponge and instrument counts were correct. The patient was then awakened and taken to recovery in stable condition.  PLAN OF CARE: Discharge to home after PACU  PATIENT DISPOSITION:  PACU - hemodynamically stable.   Delay start of Pharmacological VTE agent (>24hrs) due to surgical blood loss or risk of bleeding: not applicable

## 2016-06-16 NOTE — Anesthesia Postprocedure Evaluation (Signed)
Anesthesia Post Note  Patient: Kathryn Ayala  Procedure(s) Performed: Procedure(s) (LRB): LEFT BREAST LUMPECTOMY WITH RADIOACTIVE SEED LOCALIZATION (Left)  Patient location during evaluation: PACU Anesthesia Type: General Level of consciousness: awake and alert Pain management: pain level controlled Vital Signs Assessment: post-procedure vital signs reviewed and stable Respiratory status: spontaneous breathing, nonlabored ventilation, respiratory function stable and patient connected to nasal cannula oxygen Cardiovascular status: blood pressure returned to baseline and stable Postop Assessment: no signs of nausea or vomiting Anesthetic complications: no    Last Vitals:  Vitals:   06/16/16 1151 06/16/16 1210  BP: (!) 175/94 (!) 178/69  Pulse: 65 64  Resp: 15 18  Temp:      Last Pain:  Vitals:   06/16/16 1120  TempSrc:   PainSc: 7                  Tiajuana Amass

## 2016-06-16 NOTE — H&P (Signed)
Doyle Askew  Location: [facility] Patient #: [id number] DOB: 1935-05-08 Married / Language: English / Race: White Female   History of Present Illness  The patient is a 81 year old female who presents with breast cancer. We are asked to see the patient in consultation by Dr. Christene Slates to evaluate her for a left breast ductal carcinoma in situ. The patient is a 81 year old white female who recently went for a routine screening mammogram. At that time she was found to have and small area of abnormal calcification in the upper portion of the left breast. This was biopsied and came back as high-grade ductal carcinoma in situ. It was ER and PR positive. She denies any breast pain or discharge from the nipple. She has no personal or family history of breast cancer. She does have a history of irregular heart rate and is followed by Dr. Einar Gip. She will also need to be off the Eliquis prior to surgery.   Past Surgical History Hysterectomy (not due to cancer) - Partial  Allergies Levaquin *FLUOROQUINOLONES* PenicillAMINE *MISCELLANEOUS THERAPEUTIC CLASSES* Codeine Phosphate *ANALGESICS - OPIOID*  Medication History Eliquis (5MG  Tablet, Oral) Active. Atenolol (100MG  Tablet, Oral) Active. Allopurinol (100MG  Tablet, Oral) Active. Benazepril HCl (5MG  Tablet, Oral) Active. PROzac (20MG  Capsule, Oral) Active. Medications Reconciled  Pregnancy / Birth History  Age at menarche 8 years.    Review of Systems  General Not Present- Appetite Loss, Chills, Fatigue, Fever, Night Sweats, Weight Gain and Weight Loss. Skin Not Present- Change in Wart/Mole, Dryness, Hives, Jaundice, New Lesions, Non-Healing Wounds, Rash and Ulcer. HEENT Not Present- Earache, Hearing Loss, Hoarseness, Nose Bleed, Oral Ulcers, Ringing in the Ears, Seasonal Allergies, Sinus Pain, Sore Throat, Visual Disturbances, Wears glasses/contact lenses and Yellow Eyes. Respiratory Not Present- Bloody  sputum, Chronic Cough, Difficulty Breathing, Snoring and Wheezing. Breast Not Present- Breast Mass, Breast Pain, Nipple Discharge and Skin Changes. Cardiovascular Not Present- Chest Pain, Difficulty Breathing Lying Down, Leg Cramps, Palpitations, Rapid Heart Rate, Shortness of Breath and Swelling of Extremities. Gastrointestinal Not Present- Abdominal Pain, Bloating, Bloody Stool, Change in Bowel Habits, Chronic diarrhea, Constipation, Difficulty Swallowing, Excessive gas, Gets full quickly at meals, Hemorrhoids, Indigestion, Nausea, Rectal Pain and Vomiting. Female Genitourinary Not Present- Frequency, Nocturia, Painful Urination, Pelvic Pain and Urgency. Musculoskeletal Not Present- Back Pain, Joint Pain, Joint Stiffness, Muscle Pain, Muscle Weakness and Swelling of Extremities. Neurological Not Present- Decreased Memory, Fainting, Headaches, Numbness, Seizures, Tingling, Tremor, Trouble walking and Weakness. Psychiatric Not Present- Anxiety, Bipolar, Change in Sleep Pattern, Depression, Fearful and Frequent crying. Endocrine Not Present- Cold Intolerance, Excessive Hunger, Hair Changes, Heat Intolerance, Hot flashes and New Diabetes. Hematology Not Present- Easy Bruising, Excessive bleeding, Gland problems, HIV and Persistent Infections.  Vitals Weight: 165 lb Height: 63in Body Surface Area: 1.78 m Body Mass Index: 29.23 kg/m  Temp.: 98.78F(Temporal)  Pulse: 77 (Regular)  BP: 128/86 (Sitting, Left Arm, Standard)       Physical Exam General Mental Status-Alert. General Appearance-Consistent with stated age. Hydration-Well hydrated. Voice-Normal.  Head and Neck Head-normocephalic, atraumatic with no lesions or palpable masses. Trachea-midline. Thyroid Gland Characteristics - normal size and consistency.  Eye Eyeball - Bilateral-Extraocular movements intact. Sclera/Conjunctiva - Bilateral-No scleral icterus.  Chest and Lung Exam Chest and lung  exam reveals -quiet, even and easy respiratory effort with no use of accessory muscles and on auscultation, normal breath sounds, no adventitious sounds and normal vocal resonance. Inspection Chest Wall - Normal. Back - normal.  Breast Note: There are well-healed scars  on both breasts with some nodularity associated with the scars. Other than this there is no palpable mass in either breast. There is no palpable axillary, cervical, or cervical lymphadenopathy.   Cardiovascular Cardiovascular examination reveals -normal heart sounds, regular rate and rhythm with no murmurs and normal pedal pulses bilaterally.  Abdomen Inspection Inspection of the abdomen reveals - No Hernias. Skin - Scar - no surgical scars. Palpation/Percussion Palpation and Percussion of the abdomen reveal - Soft, Non Tender, No Rebound tenderness, No Rigidity (guarding) and No hepatosplenomegaly. Auscultation Auscultation of the abdomen reveals - Bowel sounds normal.  Neurologic Neurologic evaluation reveals -alert and oriented x 3 with no impairment of recent or remote memory. Mental Status-Normal.  Musculoskeletal Normal Exam - Left-Upper Extremity Strength Normal and Lower Extremity Strength Normal. Normal Exam - Right-Upper Extremity Strength Normal and Lower Extremity Strength Normal.  Lymphatic Head & Neck  General Head & Neck Lymphatics: Bilateral - Description - Normal. Axillary  General Axillary Region: Bilateral - Description - Normal. Tenderness - Non Tender. Femoral & Inguinal  Generalized Femoral & Inguinal Lymphatics: Bilateral - Description - Normal. Tenderness - Non Tender.    Assessment & Plan  DUCTAL CARCINOMA IN SITU (DCIS) OF LEFT BREAST (D05.12) Impression: The patient appears to have a small area of ductal carcinoma in situ in the upper portion of the left breast. I have talked her in detail about the different options for treatment and at this point she favors breast  conservation. I think this is a very reasonable way of getting rid of her breast cancer. I would plan for a left breast radioactive seed localized lumpectomy.  I have discussed with her in detail the risks and benefits of the operation as well as some of the technical aspects and she understands and wishes to proceed. I will also plan to get cardiac clearance from her cardiologist giving her history of irregular heartbeat Current Plans Referred to Oncology, for evaluation and follow up (Oncology). Routine. Pt Education - Breast Cancer: discussed with patient and provided information.

## 2016-06-16 NOTE — Interval H&P Note (Signed)
History and Physical Interval Note:  06/16/2016 9:18 AM  Kathryn Ayala  has presented today for surgery, with the diagnosis of LEFT BREAST DCIS  The various methods of treatment have been discussed with the patient and family. After consideration of risks, benefits and other options for treatment, the patient has consented to  Procedure(s): LEFT BREAST LUMPECTOMY WITH RADIOACTIVE SEED LOCALIZATION (Left) as a surgical intervention .  The patient's history has been reviewed, patient examined, no change in status, stable for surgery.  I have reviewed the patient's chart and labs.  Questions were answered to the patient's satisfaction.     TOTH III,Jatavius Ellenwood S

## 2016-06-17 ENCOUNTER — Encounter (HOSPITAL_COMMUNITY): Payer: Self-pay | Admitting: General Surgery

## 2016-06-27 ENCOUNTER — Telehealth: Payer: Self-pay | Admitting: Hematology and Oncology

## 2016-06-27 NOTE — Telephone Encounter (Signed)
Lvm to inform pt of r/s appt per [facility] due to  upcoming weather condition

## 2016-06-30 ENCOUNTER — Ambulatory Visit: Payer: Medicare Other | Admitting: Hematology and Oncology

## 2016-06-30 NOTE — Progress Notes (Signed)
Location of Breast Cancer: left breast DCIS  Histology per Pathology Report:   06/16/16 Diagnosis 1. Breast, lumpectomy, Left - DUCTAL CARCINOMA IN SITU WITH CALCIFICATIONS, HIGH GRADE, SPANNING 1.5 CM - DUCTAL CARCINOMA IN SITU IS BROADLY LESS THAN 0.1 CM TO THE INFERIOR MARGIN OF SPECIMEN # 1 - SEE ONCOLOGY TABLE BELOW. 2. Breast, excision, Left inferior margin - FIBROCYSTIC CHANGES WITH ADENOSIS. - USUAL DUCTAL HYPERPLASIA. - THERE IS NO EVIDENCE OF MALIGNANCY. - SEE COMMENT.  05/07/16   Receptor Status: ER(95%), PR (80%), Her2-neu (), Ki-()  Did patient present with symptoms (if so, please note symptoms) or was this found on screening mammography?: screening mammogram  Past/Anticipated interventions by surgeon, if any: 06/16/16 - Procedure: LEFT BREAST LUMPECTOMY WITH RADIOACTIVE SEED LOCALIZATION;  Surgeon: Autumn Messing III, MD  Past/Anticipated interventions by medical oncology, if any: antiestrogen therapy with tamoxifen 5 years  Lymphedema issues, if any:  no  Pain issues, if any:  no   OB Gyn history:  Patient was 13 years with first menses, 23 years with birth of first child, she has 2 children, 1 deceased.  She has not used bcp or hormone replacement.  SAFETY ISSUES:  Prior radiation? no  Pacemaker/ICD? no  Possible current pregnancy?no  Is the patient on methotrexate? no  Current Complaints / other details: Patient is here with her husband.  She saw Dr. Marlou Starks yesterday who said she was doing fine.  BP (!) 148/75 (BP Location: Right Arm, Patient Position: Sitting)   Pulse 65   Temp 97.6 F (36.4 C) (Oral)   Ht '5\' 3"'  (1.6 m)   Wt 166 lb 14.4 oz (75.7 kg)   SpO2 100%   BMI 29.57 kg/m    Wt Readings from Last 3 Encounters:  07/03/16 166 lb 14.4 oz (75.7 kg)  06/16/16 167 lb (75.8 kg)  06/10/16 167 lb 11.2 oz (76.1 kg)

## 2016-07-03 ENCOUNTER — Ambulatory Visit
Admission: RE | Admit: 2016-07-03 | Discharge: 2016-07-03 | Disposition: A | Discharge status: Home / Self Care | Payer: Medicare Other | Source: Ambulatory Visit | Attending: Radiation Oncology | Admitting: Radiation Oncology

## 2016-07-03 ENCOUNTER — Encounter: Payer: Self-pay | Admitting: Radiation Oncology

## 2016-07-03 VITALS — BP 148/75 | HR 65 | Temp 97.6°F | Ht 63.0 in | Wt 166.9 lb

## 2016-07-03 DIAGNOSIS — C50212 Malignant neoplasm of upper-inner quadrant of left female breast: Secondary | ICD-10-CM | POA: Diagnosis not present

## 2016-07-03 NOTE — Progress Notes (Signed)
Radiation Oncology         [phone] ________________________________  Name: Kathryn Ayala MRN: [id number]  Date: 07/03/2016  DOB: [birth year]  Re-Evaluation Visit Note  CC: Kathryn Ly, MD  Kathryn Kussmaul, MD    ICD-9-CM ICD-10-CM   1. 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[place]) 174.2 C50.212     Diagnosis: High grade DCIS (pTis, pNX) of the left breast (ER/PR positive)  Interval Since Last Radiation: N/A  Narrative:  The patient returns today for a re-evaluation. Left lumpectomy on 06/16/16 revealed high grade DCIS with calcifications spanning 1.5 cm. The DCIS was broadly less than 0.1 cm to the inferior margin. An excision of the left inferior margin revealed fibrocystic changes with adenosis, usual ductal hyperplasia, and no evidence of malignancy. The distance to the closest margin was greater than 0.2 cm to all final surgical resection margins. The patient saw Dr. Marlou Ayala yesterday who said she is healing well. The patient and her husband present today to further discuss the role of radiation in the management of her disease.  The patient reports soreness in the left breast, but denies needing to use any pain medication for this.  ALLERGIES:  is allergic to penicillins; codeine; levaquin [levofloxacin]; and sulfur.  Meds: Current Outpatient Prescriptions  Medication Sig Dispense Refill  . allopurinol (ZYLOPRIM) 100 MG tablet Take 100 mg by mouth 2 (two) times daily.   3  . atenolol (TENORMIN) 50 MG tablet Take 50 mg by mouth every morning.   2  . benazepril (LOTENSIN) 10 MG tablet Take 5 mg by mouth daily.   3  . digoxin (LANOXIN) 0.125 MG tablet Take 0.125 mg by mouth daily.    Marland Kitchen ELIQUIS 5 MG TABS tablet Take 5 mg by mouth 2 (two) times daily.  11  . FLUoxetine (PROZAC) 20 MG capsule Take 20 mg by mouth every morning.   2  . rosuvastatin (CRESTOR) 20 MG tablet Take 20 mg by mouth daily.  6  . HYDROcodone-acetaminophen (NORCO) 5-325 MG tablet Take  1-2 tablets by mouth every 4 (four) hours as needed. (Patient not taking: Reported on 07/03/2016) 20 tablet 0  . traMADol (ULTRAM) 50 MG tablet Take 1-2 tablets (50-100 mg total) by mouth every 6 (six) hours as needed. (Patient not taking: Reported on 07/03/2016) 30 tablet 0   No current facility-administered medications for this encounter.     Physical Findings: The patient is in no acute distress. Patient is alert and oriented.  height is 5\' 3"  (1.6 m) and weight is 166 lb 14.4 oz (75.7 kg). Her oral temperature is 97.6 F (36.4 C). Her blood pressure is 148/75 (abnormal) and her pulse is 65. Her oxygen saturation is 100%.   Lungs are clear to auscultation bilaterally. Heart has regular rate and rhythm. No palpable cervical, supraclavicular, or axillary adenopathy. Well healing scar in the upper aspect of the left breast with "surgical glue" in place, no sign of infection or drainage, no dominant mass in the breast.  Lab Findings: Lab Results  Component Value Date   WBC 6.7 06/10/2016   HGB 14.2 06/10/2016   HCT 44.9 06/10/2016   MCV 93.9 06/10/2016   PLT 175 06/10/2016    Radiographic Findings: No results found.  Impression: High grade DCIS (pTis, pNX) of the left breast (ER/PR positive)  We discussed the options for post-operative management, including radiation and hormonal therapy. Given the high grade natures of her DCIS, I recommend she proceed with at  least one of these treatments. Given the patient's age and distance from our treatment facility, she is hesitant to proceed with radiation therapy, but will maker her final decision after meeting with Dr. Lindi Ayala next week.  I spoke to the patient today regarding her diagnosis and options for treatment. We discussed the equivalence in terms of survival and local failure between mastectomy and breast conservation. We discussed the role of radiation in decreasing local failures in patients who undergo lumpectomy. We discussed the  process of CT simulation and the placement tattoos. We discussed approximately 16 fractions of treatment as an outpatient. We discussed the possibility of asymptomatic lung damage. We discussed the low likelihood of secondary malignancies. We discussed the possible side effects including but not limited to skin redness, fatigue, permanent skin darkening, and breast swelling. We discussed the use of cardiac sparing with deep inspiration breath hold if needed.  Plan: The patient is scheduled to follow up with Dr. Lindi Ayala on 07/07/16. The patient will call the office once she has made her decision regarding radiation treatment. If she decides against both therapies, then I recommended she follow up with her surgeon.  ____________________________________ -----------------------------------  Blair Promise, PhD, MD  This document serves as a record of services personally performed by Gery Pray, MD. It was created on his behalf by Darcus Austin, a trained medical scribe. The creation of this record is based on the scribe's personal observations and the provider's statements to them. This document has been checked and approved by the attending provider.

## 2016-07-03 NOTE — Progress Notes (Signed)
Please see the Nurse Progress Note in the MD Initial Consult Encounter for this patient. 

## 2016-07-07 ENCOUNTER — Ambulatory Visit (HOSPITAL_BASED_OUTPATIENT_CLINIC_OR_DEPARTMENT_OTHER): Payer: Medicare Other | Admitting: Hematology and Oncology

## 2016-07-07 ENCOUNTER — Encounter: Payer: Self-pay | Admitting: Hematology and Oncology

## 2016-07-07 DIAGNOSIS — Z170 Estrogen receptor positive status [ER+]: Secondary | ICD-10-CM | POA: Diagnosis not present

## 2016-07-07 DIAGNOSIS — D0512 Intraductal carcinoma in situ of left breast: Secondary | ICD-10-CM

## 2016-07-07 DIAGNOSIS — C50212 Malignant neoplasm of upper-inner quadrant of left female breast: Secondary | ICD-10-CM

## 2016-07-07 NOTE — Progress Notes (Signed)
Patient Care Team: Crist Infante, MD as PCP - General (Internal Medicine)  SUMMARY OF ONCOLOGIC HISTORY:   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[place])   05/02/2016 Mammogram    There are 3.2 cm grouped calcifications in the left breast at 12:00 posterior depth. There is architectural distortion.      05/07/2016 Initial Diagnosis    Left breast biopsy: DCIS with calcifications, high-grade, ER 95%, PR 80%, Tis N0 stage 0      06/16/2016 Surgery    Left lumpectomy: DCIS high-grade, 1.5 cm, margins clear, ER 95%, PR 80%, Tis Nx stage 0       CHIEF COMPLIANT: Follow-up after left lumpectomy  INTERVAL HISTORY: Kathryn Ayala is a 81 year old with above-mentioned history left breast DCIS underwent lumpectomy and is here today to discuss the results. She is healing very well from the recent surgery. She denies any major pain or discomfort.  REVIEW OF SYSTEMS:   Constitutional: Denies fevers, chills or abnormal weight loss Eyes: Denies blurriness of vision Ears, nose, mouth, throat, and face: Denies mucositis or sore throat Respiratory: Denies cough, dyspnea or wheezes Cardiovascular: Denies palpitation, chest discomfort Gastrointestinal:  Denies nausea, heartburn or change in bowel habits Skin: Denies abnormal skin rashes Lymphatics: Denies new lymphadenopathy or easy bruising Neurological:Denies numbness, tingling or new weaknesses Behavioral/Psych: Mood is stable, no new changes  Extremities: No lower extremity edema Breast: Recent left lumpectomy All other systems were reviewed with the patient and are negative.  I have reviewed the past medical history, past surgical history, social history and family history with the patient and they are unchanged from previous note.  ALLERGIES:  is allergic to penicillins; codeine; levaquin [levofloxacin]; and sulfur.  MEDICATIONS:  Current Outpatient Prescriptions  Medication Sig Dispense Refill  . allopurinol  (ZYLOPRIM) 100 MG tablet Take 100 mg by mouth 2 (two) times daily.   3  . atenolol (TENORMIN) 50 MG tablet Take 50 mg by mouth every morning.   2  . benazepril (LOTENSIN) 10 MG tablet Take 5 mg by mouth daily.   3  . digoxin (LANOXIN) 0.125 MG tablet Take 0.125 mg by mouth daily.    Marland Kitchen ELIQUIS 5 MG TABS tablet Take 5 mg by mouth 2 (two) times daily.  11  . FLUoxetine (PROZAC) 20 MG capsule Take 20 mg by mouth every morning.   2  . HYDROcodone-acetaminophen (NORCO) 5-325 MG tablet Take 1-2 tablets by mouth every 4 (four) hours as needed. (Patient not taking: Reported on 07/03/2016) 20 tablet 0  . rosuvastatin (CRESTOR) 20 MG tablet Take 20 mg by mouth daily.  6  . traMADol (ULTRAM) 50 MG tablet Take 1-2 tablets (50-100 mg total) by mouth every 6 (six) hours as needed. (Patient not taking: Reported on 07/03/2016) 30 tablet 0   No current facility-administered medications for this visit.     PHYSICAL EXAMINATION: ECOG PERFORMANCE STATUS: 1 - Symptomatic but completely ambulatory  Vitals:   07/07/16 1133  BP: (!) 137/59  Pulse: 65  Resp: 18  Temp: 98.6 F (37 C)   Filed Weights   07/07/16 1133  Weight: 169 lb (76.7 kg)    GENERAL:alert, no distress and comfortable SKIN: skin color, texture, turgor are normal, no rashes or significant lesions EYES: normal, Conjunctiva are pink and non-injected, sclera clear OROPHARYNX:no exudate, no erythema and lips, buccal mucosa, and tongue normal  NECK: supple, thyroid normal size, non-tender, without nodularity LYMPH:  no palpable lymphadenopathy in the cervical, axillary or  inguinal LUNGS: clear to auscultation and percussion with normal breathing effort HEART: regular rate & rhythm and no murmurs and no lower extremity edema ABDOMEN:abdomen soft, non-tender and normal bowel sounds MUSCULOSKELETAL:no cyanosis of digits and no clubbing  NEURO: alert & oriented x 3 with fluent speech, no focal motor/sensory deficits EXTREMITIES: No lower extremity  edema  LABORATORY DATA:  I have reviewed the data as listed   Chemistry      Component Value Date/Time   NA 138 06/10/2016 0950   K 3.7 06/10/2016 0950   CL 108 06/10/2016 0950   CO2 22 06/10/2016 0950   BUN 16 06/10/2016 0950   CREATININE 0.84 06/10/2016 0950      Component Value Date/Time   CALCIUM 9.7 06/10/2016 0950       Lab Results  Component Value Date   WBC 6.7 06/10/2016   HGB 14.2 06/10/2016   HCT 44.9 06/10/2016   MCV 93.9 06/10/2016   PLT 175 06/10/2016     ASSESSMENT & PLAN:  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[place]) Left lumpectomy 06/16/2016: DCIS high-grade, 1.5 cm, margins clear, ER 95%, PR 80%, Tis Nx stage 0  Recommendation: 1. +/- Adjuvant radiation therapy 2.+/-adjuvant Anastrozole 5 years  Prognosis: Based on [facility] nomogram for DCIS 1. No further treatment: 5 year risk 7%, 10 year risk 11% 2. radiation alone: 5 year risk 3%, 10 year risk 4% 3. Antiestrogen therapy alone: 5 year risk 3%, 10 year risk 5% 4. Both radiation and antiestrogen therapy: 5 year risk 1%, 10 year risk 2%  After discussing all of these options, patient decided that she would take antiestrogen therapy. We discussed the different treatment options including tamoxifen versus anastrozole. Since the patient uses antidepressants, I recommended anastrozole. I will inform radiation oncology that she does not plan to undergo adjuvant radiation. Patient will call us and inform us of her decision. If she decides to take the medication then we will send a prescription to her pharmacy.   Return to clinic in 3 months after starting antiestrogen therapy with anastrozole.  No orders of the defined types were placed in this encounter.  The patient has a good understanding of the overall plan. she agrees with it. she will call with any problems that may develop before the next visit here.   Rulon Eisenmenger, MD 07/07/16

## 2016-07-07 NOTE — Assessment & Plan Note (Signed)
Left lumpectomy 06/16/2016: DCIS high-grade, 1.5 cm, margins clear, ER 95%, PR 80%, Tis Nx stage 0  Recommendation: 1. +/- Adjuvant radiation therapy 2.+/-adjuvant tamoxifen 5 years  Prognosis: Based on [facility] nomogram for DCIS 1. No further treatment: 5 year risk 7%, 10 year risk 11% 2. radiation alone: 5 year risk 3%, 10 year risk 4% 3. Antiestrogen therapy alone: 5 year risk 3%, 10 year risk 5% 4. Both radiation and antiestrogen therapy: 5 year risk 1%, 10 year risk 2%

## 2016-07-08 ENCOUNTER — Encounter: Payer: Self-pay | Admitting: *Deleted

## 2016-07-08 DIAGNOSIS — C50212 Malignant neoplasm of upper-inner quadrant of left female breast: Secondary | ICD-10-CM

## 2016-07-15 ENCOUNTER — Telehealth: Payer: Self-pay | Admitting: *Deleted

## 2016-07-15 NOTE — Telephone Encounter (Signed)
Pt states PCP will switch her from Prozac to Venlafaxine 37.5 mg daily for 1 week, then will take 2 tablets daily. She will need to be off the Prozac 1 week prior to starting Tamoxifen.  She wants to know if there are any side effects with taking Venlafaxine and Tamoxifen.

## 2016-07-15 NOTE — Telephone Encounter (Signed)
No problems takinf effoxor and Tam. Some fatigue. So take effexor at night VG

## 2016-07-28 ENCOUNTER — Telehealth: Payer: Self-pay | Admitting: *Deleted

## 2016-07-28 NOTE — Telephone Encounter (Signed)
Eulas Post, Please send Tamoxifen 20 daily (90 pills with 3 refills) V

## 2016-07-28 NOTE — Telephone Encounter (Signed)
Pt states she is 6 weeks post op and would like to start tamoxifen. Has stopped prozac, switched to effexor

## 2016-07-29 ENCOUNTER — Telehealth: Payer: Self-pay | Admitting: *Deleted

## 2016-07-29 ENCOUNTER — Encounter: Payer: Self-pay | Admitting: *Deleted

## 2016-07-29 MED ORDER — TAMOXIFEN CITRATE 20 MG PO TABS: 20.0000 mg | ORAL_TABLET | Freq: Every day | ORAL | 3 refills | Status: DC

## 2016-07-29 NOTE — Telephone Encounter (Signed)
Called pt verified pharmacy, Tamoxifen sent to [facility]. Pt verbalized understanding and will start medication today.

## 2016-09-22 ENCOUNTER — Other Ambulatory Visit: Payer: Self-pay | Admitting: General Surgery

## 2016-09-22 DIAGNOSIS — R221 Localized swelling, mass and lump, neck: Secondary | ICD-10-CM

## 2016-09-26 ENCOUNTER — Ambulatory Visit
Admission: RE | Admit: 2016-09-26 | Discharge: 2016-09-26 | Disposition: A | Discharge status: Home / Self Care | Payer: Medicare Other | Source: Ambulatory Visit | Attending: General Surgery | Admitting: General Surgery

## 2016-09-26 ENCOUNTER — Other Ambulatory Visit: Payer: Medicare Other

## 2016-09-26 DIAGNOSIS — R221 Localized swelling, mass and lump, neck: Secondary | ICD-10-CM

## 2016-10-20 HISTORY — PX: WRIST SURGERY: SHX841

## 2016-10-31 ENCOUNTER — Telehealth: Payer: Self-pay

## 2016-10-31 NOTE — Telephone Encounter (Signed)
Pt called stating she got a call re appt on 11/04/16 and who is she seeing and why is she having the appt. Explained that the appt is with Dr Lindi Adie as a f/u appt for the antiestrogen therapy.

## 2016-11-03 NOTE — Assessment & Plan Note (Signed)
Left lumpectomy 06/16/2016: DCIS high-grade, 1.5 cm, margins clear, ER 95%, PR 80%, Tis Nx stage 0  Recommendation: adjuvant Tamoxifen 20 mg daily 5 years  Prognosis: Based on [facility] nomogram for DCIS 1. No further treatment: 5 year risk 7%, 10 year risk 11% 2. radiation alone: 5 year risk 3%, 10 year risk 4% 3. Antiestrogen therapy alone: 5 year risk 3%, 10 year risk 5% 4. Both radiation and antiestrogen therapy: 5 year risk 1%, 10 year risk 2%  Current treatment: Tamoxifen 20 mg daily (Didnot need XRT)

## 2016-11-04 ENCOUNTER — Ambulatory Visit (HOSPITAL_BASED_OUTPATIENT_CLINIC_OR_DEPARTMENT_OTHER): Payer: Medicare Other | Admitting: Hematology and Oncology

## 2016-11-04 ENCOUNTER — Encounter: Payer: Self-pay | Admitting: Hematology and Oncology

## 2016-11-04 DIAGNOSIS — D0512 Intraductal carcinoma in situ of left breast: Secondary | ICD-10-CM

## 2016-11-04 DIAGNOSIS — Z79810 Long term (current) use of selective estrogen receptor modulators (SERMs): Secondary | ICD-10-CM | POA: Diagnosis not present

## 2016-11-04 DIAGNOSIS — Z170 Estrogen receptor positive status [ER+]: Secondary | ICD-10-CM | POA: Diagnosis not present

## 2016-11-04 DIAGNOSIS — C50212 Malignant neoplasm of upper-inner quadrant of left female breast: Secondary | ICD-10-CM

## 2016-11-04 NOTE — Progress Notes (Signed)
Patient Care Team: Crist Infante, MD as PCP - General (Internal Medicine)  DIAGNOSIS:  Encounter Diagnosis  Name Primary?  . Malignant neoplasm of upper-inner quadrant of left breast in female, estrogen receptor positive ([place])     SUMMARY OF ONCOLOGIC HISTORY:   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[place])   05/02/2016 Mammogram    There are 3.2 cm grouped calcifications in the left breast at 12:00 posterior depth. There is architectural distortion.      05/07/2016 Initial Diagnosis    Left breast biopsy: DCIS with calcifications, high-grade, ER 95%, PR 80%, Tis N0 stage 0      06/16/2016 Surgery    Left lumpectomy: DCIS high-grade, 1.5 cm, margins clear, ER 95%, PR 80%, Tis Nx stage 0      07/28/2016 -  Anti-estrogen oral therapy    Tamoxifen 20 mg daily (Didnot need XRT)       CHIEF COMPLIANT: follow-up on tamoxifen therapy  INTERVAL HISTORY: Carneisha Akande is a 55 year with above-mentioned history of left breast DCIS treated with lumpectomy and is currently on tamoxifen therapy.she is tolerating tamoxifen extremely well. She does not have any hot flashes or myalgias  REVIEW OF SYSTEMS:   Constitutional: Denies fevers, chills or abnormal weight loss Eyes: Denies blurriness of vision Ears, nose, mouth, throat, and face: Denies mucositis or sore throat Respiratory: Denies cough, dyspnea or wheezes Cardiovascular: Denies palpitation, chest discomfort Gastrointestinal:  Denies nausea, heartburn or change in bowel habits Skin: Denies abnormal skin rashes Lymphatics: Denies new lymphadenopathy or easy bruising Neurological:Denies numbness, tingling or new weaknesses Behavioral/Psych: Mood is stable, no new changes  Extremities: No lower extremity edema Breast:  denies any pain or lumps or nodules in either breasts All other systems were reviewed with the patient and are negative.  I have reviewed the past medical history, past surgical history,  social history and family history with the patient and they are unchanged from previous note.  ALLERGIES:  is allergic to penicillins; codeine; levaquin [levofloxacin]; and sulfur.  MEDICATIONS:  Current Outpatient Prescriptions  Medication Sig Dispense Refill  . allopurinol (ZYLOPRIM) 100 MG tablet Take 100 mg by mouth 2 (two) times daily.   3  . atenolol (TENORMIN) 50 MG tablet Take 50 mg by mouth every morning.   2  . benazepril (LOTENSIN) 10 MG tablet Take 5 mg by mouth daily.   3  . digoxin (LANOXIN) 0.125 MG tablet Take 0.125 mg by mouth daily.    Marland Kitchen ELIQUIS 5 MG TABS tablet Take 5 mg by mouth 2 (two) times daily.  11  . HYDROcodone-acetaminophen (NORCO) 5-325 MG tablet Take 1-2 tablets by mouth every 4 (four) hours as needed. (Patient not taking: Reported on 07/03/2016) 20 tablet 0  . rosuvastatin (CRESTOR) 20 MG tablet Take 20 mg by mouth daily.  6  . tamoxifen (NOLVADEX) 20 MG tablet Take 1 tablet (20 mg total) by mouth daily. 90 tablet 3  . traMADol (ULTRAM) 50 MG tablet Take 1-2 tablets (50-100 mg total) by mouth every 6 (six) hours as needed. (Patient not taking: Reported on 07/03/2016) 30 tablet 0  . Venlafaxine HCl (EFFEXOR XR PO) Take by mouth.     No current facility-administered medications for this visit.     PHYSICAL EXAMINATION: ECOG PERFORMANCE STATUS: 0 - Asymptomatic  Vitals:   11/04/16 1102  BP: (!) 130/56  Pulse: 63  Resp: 18  Temp: 97.5 F (36.4 C)   Filed Weights   11/04/16  1102  Weight: 172 lb 8 oz (78.2 kg)    GENERAL:alert, no distress and comfortable SKIN: skin color, texture, turgor are normal, no rashes or significant lesions EYES: normal, Conjunctiva are pink and non-injected, sclera clear OROPHARYNX:no exudate, no erythema and lips, buccal mucosa, and tongue normal  NECK: supple, thyroid normal size, non-tender, without nodularity LYMPH:  no palpable lymphadenopathy in the cervical, axillary or inguinal LUNGS: clear to auscultation and  percussion with normal breathing effort HEART: regular rate & rhythm and no murmurs and no lower extremity edema ABDOMEN:abdomen soft, non-tender and normal bowel sounds MUSCULOSKELETAL:no cyanosis of digits and no clubbing  NEURO: alert & oriented x 3 with fluent speech, no focal motor/sensory deficits EXTREMITIES: No lower extremity edema  LABORATORY DATA:  I have reviewed the data as listed   Chemistry      Component Value Date/Time   NA 138 06/10/2016 0950   K 3.7 06/10/2016 0950   CL 108 06/10/2016 0950   CO2 22 06/10/2016 0950   BUN 16 06/10/2016 0950   CREATININE 0.84 06/10/2016 0950      Component Value Date/Time   CALCIUM 9.7 06/10/2016 0950       Lab Results  Component Value Date   WBC 6.7 06/10/2016   HGB 14.2 06/10/2016   HCT 44.9 06/10/2016   MCV 93.9 06/10/2016   PLT 175 06/10/2016    ASSESSMENT & PLAN:  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[place]) Left lumpectomy 06/16/2016: DCIS high-grade, 1.5 cm, margins clear, ER 95%, PR 80%, Tis Nx stage 0  Recommendation: adjuvant Tamoxifen 20 mg daily 5 years started October 2017  Prognosis: Based on [facility] nomogram for DCIS 1. No further treatment: 5 year risk 7%, 10 year risk 11% 2. radiation alone: 5 year risk 3%, 10 year risk 4% 3. Antiestrogen therapy alone: 5 year risk 3%, 10 year risk 5% 4. Both radiation and antiestrogen therapy: 5 year risk 1%, 10 year risk 2%  Current treatment: Tamoxifen 20 mg daily (Didnot need XRT) Tamoxifen toxicities: Denies any hot flashes or myalgias. I encouraged the patient to stay active and do physical exercise 30 minutes every day.  Return to clinic in 6 months for follow-up for her breast exam. I spent 15 minutes talking to the patient of which more than half was spent in counseling and coordination of care.  No orders of the defined types were placed in this encounter.  The patient has a good understanding of the overall plan. she agrees  with it. she will call with any problems that may develop before the next visit here.   Rulon Eisenmenger, MD 11/04/16

## 2016-11-18 ENCOUNTER — Telehealth: Payer: Self-pay | Admitting: Adult Health

## 2016-11-18 NOTE — Telephone Encounter (Signed)
The Survivorship Care Plan was mailed to Kathryn Ayala as she reported not being able to come in to the Survivorship Clinic for an in-person visit at this time. A letter was mailed to her outlining the purpose of the content of the care plan, as well as encouraging her to reach out to me with any questions or concerns.  .  Additional resources regarding healthy eating, recommendations for exercise, [employer] program, and brochures for support services were also included in the mailed packet.  A shorter version of the care plan was also routed/faxed/mailed to [place], MD, the patient's PCP.  I will not be placing any follow-up appointments to the Survivorship Clinic for Kathryn Ayala, but I am happy to see her at any time in the future for any survivorship concerns that may arise. Thank you for allowing me to participate in her care!  Charlestine Massed, NP [place] [phone]

## 2017-02-16 ENCOUNTER — Ambulatory Visit (INDEPENDENT_AMBULATORY_CARE_PROVIDER_SITE_OTHER): Payer: Medicare Other | Admitting: Orthopaedic Surgery

## 2017-02-16 ENCOUNTER — Ambulatory Visit (INDEPENDENT_AMBULATORY_CARE_PROVIDER_SITE_OTHER): Payer: Medicare Other

## 2017-02-16 ENCOUNTER — Encounter (INDEPENDENT_AMBULATORY_CARE_PROVIDER_SITE_OTHER): Payer: Self-pay | Admitting: Orthopaedic Surgery

## 2017-02-16 VITALS — BP 176/95 | HR 75 | Ht 63.0 in | Wt 160.0 lb

## 2017-02-16 DIAGNOSIS — M25561 Pain in right knee: Secondary | ICD-10-CM | POA: Diagnosis not present

## 2017-02-16 DIAGNOSIS — M65331 Trigger finger, right middle finger: Secondary | ICD-10-CM

## 2017-02-16 MED ORDER — BUPIVACAINE HCL 0.5 % IJ SOLN
3.0000 mL | INTRAMUSCULAR | Status: AC | PRN
  Administered 2017-02-16: 3 mL via INTRA_ARTICULAR

## 2017-02-16 MED ORDER — LIDOCAINE HCL 1 % IJ SOLN
5.0000 mL | INTRAMUSCULAR | Status: AC | PRN
  Administered 2017-02-16: 5 mL

## 2017-02-16 MED ORDER — METHYLPREDNISOLONE ACETATE 40 MG/ML IJ SUSP
80.0000 mg | INTRAMUSCULAR | Status: AC | PRN
  Administered 2017-02-16: 80 mg

## 2017-02-16 NOTE — Progress Notes (Signed)
Office Visit Note   Patient: Kathryn Ayala           Date of Birth: 01/08/1935           MRN: [id number] Visit Date: 02/16/2017              Requested by: Crist Infante, MD [address] [place], [place] [address] PCP: Jerlyn Ly, MD   Assessment & Plan: Visit Diagnoses:  1. Acute pain of right knee   Prior history of advanced osteoarthritis right knee  Plan: Cortisone injection right knee, follow-up in 2 weeks. Patient also has a right long trigger finger. We will make a splint and see her in 2 weeks for consideration of an injection   Follow-Up Instructions: No Follow-up on file.   Orders:  Orders Placed This Encounter  Procedures  . XR KNEE 3 VIEW RIGHT   No orders of the defined types were placed in this encounter.     Procedures: Large Joint Inj Date/Time: 02/16/2017 1:47 PM Performed by: Garald Balding Authorized by: Garald Balding   Consent Given by:  Patient Timeout: prior to procedure the correct patient, procedure, and site was verified   Indications:  Pain and joint swelling Location:  Knee Site:  R knee Prep: patient was prepped and draped in usual sterile fashion   Needle Size:  25 G Needle Length:  1.5 inches Approach:  Anteromedial Ultrasound Guidance: No   Fluoroscopic Guidance: No   Arthrogram: No   Medications:  5 mL lidocaine 1 %; 80 mg methylPREDNISolone acetate 40 MG/ML; 3 mL bupivacaine 0.5 % Aspiration Attempted: No   Patient tolerance:  Patient tolerated the procedure well with no immediate complications     Clinical Data: No additional findings.   Subjective: Chief Complaint  Patient presents with  . Left Knee - Weakness, Pain    Kathryn Ayala is an 82 y o that presents with acute Right knee pain x 1 day. She relates she was eating lunch and after an hour, she stood to walk and knee"Gave way".  Denies injury, hurts to bear weight  Denies fever or chills. No having some pain with weightbearing predominantly along the  medial compartment of her knee. Had similar episode of acute knee pain in July 2017 that responded to cortisone injection. Prior films demonstrate significant tricompartmental arthritis in both knees Kathryn Ayala also notes that she's had some active triggering of her right long finger which has obviously become a nuisance HPI  Review of Systems   Objective: Vital Signs: BP (!) 176/95   Pulse 75   Ht 5\' 3"  (1.6 m)   Wt 160 lb (72.6 kg)   BMI 28.34 kg/m   Physical Exam  Ortho Exam large knees. Right knee is not hot red or swollen. Difficult to determine if there is an effusion. Painful extension but full. Flexed about 95 when calf touched thigh. No swelling distally. Good pulses. Neurovascular exam intact. Straight leg raise negative bilaterally. Painless range of motion of left hip with internal and external rotation. Mostly anterior medial joint pain. No masses. Active triggering of right long finger with discomfort at the metacarpal phalangeal joint volar surface. All consistent with teno synovitis. Specialty Comments:  No specialty comments available.  Imaging: No results found.   PMFS History: Patient Active Problem List   Diagnosis Date Noted  . 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[place]) 05/26/2016   Past Medical History:  Diagnosis Date  . Anemia  after a pregnancy  . Anxiety   . Arthritis   . Atrial fibrillation ([place])   . Breast cancer, left ([place])   . Complication of anesthesia    rapid heart beat afterwards sometimes  . Depression   . Hypertension   . Kidney stones   . Pneumonia   . 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 Urinary frequency     Family History  Problem Relation Age of Onset  . Lung cancer Brother     Past Surgical History:  Procedure Laterality Date  . ABDOMINAL HYSTERECTOMY  1996  . APPENDECTOMY  1956  . BREAST BIOPSY Left 05/07/2016  . BREAST BIOPSY Bilateral 1956, 1980  .  BREAST LUMPECTOMY WITH RADIOACTIVE SEED LOCALIZATION Left 06/16/2016   Procedure: LEFT BREAST LUMPECTOMY WITH RADIOACTIVE SEED LOCALIZATION;  Surgeon: Autumn Messing III, MD;  Location: [facility];  Service: General;  Laterality: Left;  . COLONOSCOPY    . KIDNEY STONE SURGERY    . KNEE SURGERY  11/20/2004  . WRIST SURGERY Left    Social History   Occupational History  . Not on file.   Social History Main Topics  . Smoking status: Never Smoker  . Smokeless tobacco: Never Used  . Alcohol use No  . Drug use: No  . Sexual activity: Not on file     Garald Balding, MD   Note - This record has been created using [employer].  Chart creation errors have been sought, but may not always  have been located. Such creation errors do not reflect on  the standard of medical care.

## 2017-03-06 ENCOUNTER — Ambulatory Visit (INDEPENDENT_AMBULATORY_CARE_PROVIDER_SITE_OTHER): Payer: Medicare Other | Admitting: Orthopaedic Surgery

## 2017-03-06 ENCOUNTER — Encounter (INDEPENDENT_AMBULATORY_CARE_PROVIDER_SITE_OTHER): Payer: Self-pay | Admitting: Orthopaedic Surgery

## 2017-03-06 VITALS — Ht 63.0 in | Wt 160.0 lb

## 2017-03-06 DIAGNOSIS — M65331 Trigger finger, right middle finger: Secondary | ICD-10-CM | POA: Diagnosis not present

## 2017-03-06 DIAGNOSIS — M25561 Pain in right knee: Secondary | ICD-10-CM

## 2017-03-06 DIAGNOSIS — G8929 Other chronic pain: Secondary | ICD-10-CM

## 2017-03-06 MED ORDER — METHYLPREDNISOLONE ACETATE 40 MG/ML IJ SUSP
20.0000 mg | INTRAMUSCULAR | Status: AC | PRN
  Administered 2017-03-06: 20 mg

## 2017-03-06 MED ORDER — LIDOCAINE HCL (PF) 1 % IJ SOLN
1.0000 mL | INTRAMUSCULAR | Status: AC | PRN
  Administered 2017-03-06: 1 mL

## 2017-03-06 NOTE — Progress Notes (Signed)
Office Visit Note   Patient: Kathryn Ayala           Date of Birth: 04/19/1935           MRN: [id number] Visit Date: 03/06/2017              Requested by: Crist Infante, MD [address] [place],  [id number] PCP: Crist Infante, MD   Assessment & Plan: Visit Diagnoses:  1. Chronic pain of right knee   2. Trigger finger, right middle finger     Plan: Consider Visco supplementation right knee. Cortisone injection right long finger. Long discussion regarding what she may expect over time with her right knee and it and the osteoarthritis. Also discussed surgical intervention for chronic triggering of long finger  Follow-Up Instructions: Return if symptoms worsen or fail to improve.   Orders:  No orders of the defined types were placed in this encounter.  No orders of the defined types were placed in this encounter.     Procedures: Hand/UE Inj Date/Time: 03/06/2017 11:53 AM Performed by: Garald Balding Authorized by: Garald Balding   Consent Given by:  Patient Site marked: the procedure site was marked   Timeout: prior to procedure the correct patient, procedure, and site was verified   Indications:  Pain Condition: trigger finger   Location:  Long finger Site:  R long A1 Needle Size:  27 G Approach:  Volar Ultrasound Guidance: No   Medications:  1 mL lidocaine (PF) 1 %; 20 mg methylPREDNISolone acetate 40 MG/ML     Clinical Data: No additional findings.   Subjective: Chief Complaint  Patient presents with  . Right Hand - Weakness    [name] is an 82 y o that presents with Right long trigger finger pain. Weakness no numbness.   [name] recently had a cortisone injection into her right knee. She relates that it made it a difference. He still having some discomfort but certainly more mobile and functional. She has had a chronic triggering of the right long finger and wanted to discuss treatment options. She is able to actively trigger the  finger. No numbness or tingling. No history of injury.  HPI  Review of Systems   Objective: Vital Signs: Ht 5\' 3"  (1.6 m)   Wt 160 lb (72.6 kg)   BMI 28.34 kg/m   Physical Exam  Ortho Exam right knee exam without effusion. Full extension and flexion over 105. No instability. No calf pain. Neurovascular exam intact. Predominantly medial joint pain . Some patellar crepitation. Right long finger with active triggering. Pain and a small nodule on the palmar aspect long finger near the metacarpal phalangeal joint consistent with triggering. Skin intact. Neurovascular exam intact  Specialty Comments:  No specialty comments available.  Imaging: No results found.   PMFS History: Patient Active Problem List   Diagnosis Date Noted  . Trigger finger, right middle finger 02/16/2017  . Acute pain of right knee 02/16/2017  . 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[place]) 05/26/2016   Past Medical History:  Diagnosis Date  . Anemia    after a pregnancy  . Anxiety   . Arthritis   . Atrial fibrillation ([place])   . Breast cancer, left ([place])   . Complication of anesthesia    rapid heart beat afterwards sometimes  . Depression   . Hypertension   . Kidney stones   . Pneumonia   . 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 Urinary frequency     Family History  Problem Relation Age of Onset  . Lung cancer Brother     Past Surgical History:  Procedure Laterality Date  . ABDOMINAL HYSTERECTOMY  1996  . APPENDECTOMY  1956  . BREAST BIOPSY Left 05/07/2016  . BREAST BIOPSY Bilateral 1956, 1980  . BREAST LUMPECTOMY WITH RADIOACTIVE SEED LOCALIZATION Left 06/16/2016   Procedure: LEFT BREAST LUMPECTOMY WITH RADIOACTIVE SEED LOCALIZATION;  Surgeon: Autumn Messing III, MD;  Location: [facility];  Service: General;  Laterality: Left;  . COLONOSCOPY    . KIDNEY STONE SURGERY    . KNEE SURGERY  11/20/2004  . WRIST SURGERY Left    Social History    Occupational History  . Not on file.   Social History Main Topics  . Smoking status: Never Smoker  . Smokeless tobacco: Never Used  . Alcohol use No  . Drug use: No  . Sexual activity: Not on file     Garald Balding, MD   Note - This record has been created using [employer].  Chart creation errors have been sought, but may not always  have been located. Such creation errors do not reflect on  the standard of medical care.

## 2017-04-29 ENCOUNTER — Ambulatory Visit (HOSPITAL_BASED_OUTPATIENT_CLINIC_OR_DEPARTMENT_OTHER): Payer: Medicare Other | Admitting: Adult Health

## 2017-04-29 VITALS — BP 127/68 | HR 77 | Temp 97.5°F | Resp 19 | Ht 63.0 in | Wt 173.9 lb

## 2017-04-29 DIAGNOSIS — Z79810 Long term (current) use of selective estrogen receptor modulators (SERMs): Secondary | ICD-10-CM | POA: Diagnosis not present

## 2017-04-29 DIAGNOSIS — D0512 Intraductal carcinoma in situ of left breast: Secondary | ICD-10-CM

## 2017-04-29 DIAGNOSIS — Z170 Estrogen receptor positive status [ER+]: Secondary | ICD-10-CM | POA: Diagnosis not present

## 2017-04-29 DIAGNOSIS — C50212 Malignant neoplasm of upper-inner quadrant of left female breast: Secondary | ICD-10-CM

## 2017-04-29 NOTE — Progress Notes (Signed)
CLINIC:  Survivorship   REASON FOR VISIT:  Routine follow-up for history of breast cancer.   BRIEF ONCOLOGIC HISTORY:    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[place])   05/02/2016 Mammogram    There are 3.2 cm grouped calcifications in the left breast at 12:00 posterior depth. There is architectural distortion.      05/07/2016 Initial Diagnosis    Left breast biopsy: DCIS with calcifications, high-grade, ER 95%, PR 80%, Tis N0 stage 0      06/16/2016 Surgery    Left lumpectomy: DCIS high-grade, 1.5 cm, margins clear, ER 95%, PR 80%, Tis Nx stage 0      07/28/2016 -  Anti-estrogen oral therapy    Tamoxifen 20 mg daily (Didnot need XRT)        INTERVAL HISTORY:  Kathryn Ayala presents to the [facility] today for routine follow-up for her history of breast cancer.  Overall, she reports feeling quite well. She is taking Tamoxifen daily and is tolerating it well.  She is without any questions or concerns today.      REVIEW OF SYSTEMS:  Review of Systems  Constitutional: Negative for appetite change, chills, fatigue, fever and unexpected weight change.  HENT:   Negative for hearing loss and lump/mass.   Eyes: Negative for eye problems and icterus.  Respiratory: Negative for chest tightness, cough and shortness of breath.   Cardiovascular: Negative for chest pain, leg swelling and palpitations.  Gastrointestinal: Negative for abdominal distention, abdominal pain, constipation, diarrhea, nausea and vomiting.  Endocrine: Negative for hot flashes.  Genitourinary: Negative for difficulty urinating.   Musculoskeletal: Negative for arthralgias.  Skin: Negative for itching and rash.  Neurological: Negative for dizziness, extremity weakness, headaches and numbness.  Hematological: Negative for adenopathy. Does not bruise/bleed easily.  Psychiatric/Behavioral: Negative for depression. The patient is not nervous/anxious.   Breast: Denies any new  nodularity, masses, tenderness, nipple changes, or nipple discharge.       PAST MEDICAL/SURGICAL HISTORY:  Past Medical History:  Diagnosis Date  . Anemia    after a pregnancy  . Anxiety   . Arthritis   . Atrial fibrillation ([place])   . Breast cancer, left ([place])   . Complication of anesthesia    rapid heart beat afterwards sometimes  . Depression   . Hypertension   . Kidney stones   . Pneumonia   . 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 Urinary frequency    Past Surgical History:  Procedure Laterality Date  . ABDOMINAL HYSTERECTOMY  1996  . APPENDECTOMY  1956  . BREAST BIOPSY Left 05/07/2016  . BREAST BIOPSY Bilateral 1956, 1980  . BREAST LUMPECTOMY WITH RADIOACTIVE SEED LOCALIZATION Left 06/16/2016   Procedure: LEFT BREAST LUMPECTOMY WITH RADIOACTIVE SEED LOCALIZATION;  Surgeon: Autumn Messing III, MD;  Location: [place];  Service: General;  Laterality: Left;  . COLONOSCOPY    . KIDNEY STONE SURGERY    . KNEE SURGERY  11/20/2004  . WRIST SURGERY Left      ALLERGIES:  Allergies  Allergen Reactions  . Penicillins Anaphylaxis, Swelling and Rash    Has patient had a PCN reaction causing immediate rash, facial/tongue/throat swelling, SOB or lightheadedness with hypotension: Yes Has patient had a PCN reaction causing severe rash involving mucus membranes or skin necrosis: Yes Has patient had a PCN reaction that required hospitalization No Has patient had a PCN reaction occurring within the last 10  years: No If all of the above answers are "NO", then may proceed with Cephalosporin use.   . Codeine Other (See Comments)    UNSPECIFIED REACTION Patient was told not to take it.  . Levaquin [Levofloxacin] Other (See Comments)    UNSPECIFIED REACTION Patient was told not to take it.  Marland Kitchen Sulfur Other (See Comments)    unknown     CURRENT MEDICATIONS:  Outpatient Encounter Prescriptions as of 04/29/2017  Medication Sig Note  . allopurinol  (ZYLOPRIM) 100 MG tablet Take 100 mg by mouth 2 (two) times daily.  05/23/2016: Received from: External Pharmacy Received Sig: TAKE 2 TABLETS BY MOUTH EVERY DAY  . atenolol (TENORMIN) 50 MG tablet Take 50 mg by mouth every morning.  05/23/2016: Received from: External Pharmacy Received Sig: TAKE 1 TABLET BY MOUTH TWICE A DAY  . benazepril (LOTENSIN) 10 MG tablet Take 5 mg by mouth daily.  05/23/2016: Received from: External Pharmacy Received Sig: TAKE ONE TABLET BY MOUTH DAILY  . digoxin (LANOXIN) 0.125 MG tablet Take 0.125 mg by mouth daily.   Marland Kitchen ELIQUIS 5 MG TABS tablet Take 5 mg by mouth 2 (two) times daily. 05/23/2016: Received from: External Pharmacy Received Sig: TAKE 1 TABLET BY MOUTH TWICE DAILY  . HYDROcodone-acetaminophen (NORCO) 5-325 MG tablet Take 1-2 tablets by mouth every 4 (four) hours as needed. (Patient not taking: Reported on 07/03/2016)   . rosuvastatin (CRESTOR) 20 MG tablet Take 20 mg by mouth daily. 05/23/2016: Received from: External Pharmacy Received Sig: TAKE 1 TABLET BY MOUTH ONCE DAILY  . tamoxifen (NOLVADEX) 20 MG tablet Take 1 tablet (20 mg total) by mouth daily.   . traMADol (ULTRAM) 50 MG tablet Take 1-2 tablets (50-100 mg total) by mouth every 6 (six) hours as needed.   . Venlafaxine HCl (EFFEXOR XR PO) Take by mouth.    No facility-administered encounter medications on file as of 04/29/2017.      ONCOLOGIC FAMILY HISTORY:  Family History  Problem Relation Age of Onset  . Lung cancer Brother     SOCIAL HISTORY:  Kathryn Ayala is married and lives with her husband in [place], [place].   Kathryn Ayala is currently retired.  She denies any current or history of tobacco, alcohol, or illicit drug use.     PHYSICAL EXAMINATION:  Vital Signs: Vitals:   04/29/17 1537  BP: 127/68  Pulse: 77  Resp: 19  Temp: (!) 97.5 F (36.4 C)   Filed Weights   04/29/17 1537  Weight: 173 lb 14.4 oz (78.9 kg)   General: Well-nourished, well-appearing female in no acute  distress. Accompanied by her husband Kathryn Ayala today.   HEENT: Head is normocephalic.  Pupils equal and reactive to light. Conjunctivae clear without exudate.  Sclerae anicteric. Oral mucosa is pink, moist.  Oropharynx is pink without lesions or erythema.  Lymph: No cervical, supraclavicular, or infraclavicular lymphadenopathy noted on palpation.  Cardiovascular: Regular rate and rhythm.Marland Kitchen Respiratory: Clear to auscultation bilaterally. Chest expansion symmetric; breathing non-labored.  Breast Exam:  -Left breast: No appreciable masses on palpation. No skin redness, thickening, or peau d'orange appearance; no nipple retraction or nipple discharge; mild distortion in symmetry at previous lumpectomy site well healed scar without erythema or nodularity.  -Right breast: No appreciable masses on palpation. No skin redness, thickening, or peau d'orange appearance; no nipple retraction or nipple discharge; -Axilla: No axillary adenopathy bilaterally.  GI: Abdomen soft and round; non-tender, non-distended. Bowel sounds normoactive. No hepatosplenomegaly.   GU: Deferred.  Neuro:  No focal deficits. Steady gait.  Psych: Mood and affect normal and appropriate for situation.  MSK: No focal spinal tenderness to palpation, full range of motion in bilateral upper extremities Extremities: No edema. Skin: Warm and dry.  LABORATORY DATA:  None for this visit   DIAGNOSTIC IMAGING:  Most recent mammogram: done today, awaiting results from [place]:  Ms.. Ayala is a pleasant 82 y.o. female with history of Stage 0 left breast DCIS, ER+/PR+, diagnosed in 04/2016, treated with lumpectomy, and anti-estrogen therapy with Tamoxifen beginning in 07/2016.  She [facility] for surveillance and routine follow-up.   1. History of breast cancer:  Kathryn Ayala is currently clinically and radiographically without evidence of disease or recurrence of breast cancer. She will be due for  mammogram in 04/29/2018 orders placed today.  She will continue her anti-estrogen therapy with Tamoxifen, with plans to continue for 5 years.  She will return to the cancer center to see her medical oncologist, Dr. Lindi Adie, in 04/2018.  I encouraged her to call me with any questions or concerns before her next visit at the cancer center, and I would be happy to see her sooner, if needed.    2. Bone health:  Given Kathryn Ayala's age, history of breast cancer, she is at slight risk for bone demineralization. The Tamoxifen should help strengthen her bones.   She was also given education on specific food and activities to promote bone health.  I will defer to her PCP regarding any bone density testing and management.  3. Cancer screening:  Due to Kathryn Ayala's history and her age, she should receive screening for skin cancers, colon cancer. She was encouraged to follow-up with her PCP for appropriate cancer screenings.   4. Health maintenance and wellness promotion: Kathryn Ayala was encouraged to consume 5-7 servings of fruits and vegetables per day. She was also encouraged to engage in moderate to vigorous exercise for 30 minutes per day most days of the week. She was instructed to limit her alcohol consumption and continue to abstain from tobacco use.    Dispo:  -Return to cancer center in one year for follow up with Dr. Lindi Adie.   -Follow up with Dr. Marlou Starks in 10/2017  A total of (30) minutes of face-to-face time was spent with this patient with greater than 50% of that time in counseling and care-coordination.   Gardenia Phlegm, NP Survivorship Program [facility] [phone]   Note: PRIMARY CARE PROVIDER Crist Infante, [place] [phone]

## 2017-05-01 ENCOUNTER — Encounter: Payer: Self-pay | Admitting: Adult Health

## 2017-05-05 ENCOUNTER — Ambulatory Visit: Payer: Medicare Other | Admitting: Hematology and Oncology

## 2017-05-07 ENCOUNTER — Encounter (HOSPITAL_COMMUNITY): Payer: Medicare Other

## 2017-05-18 ENCOUNTER — Ambulatory Visit (INDEPENDENT_AMBULATORY_CARE_PROVIDER_SITE_OTHER): Payer: Medicare Other | Admitting: Orthopaedic Surgery

## 2017-05-18 ENCOUNTER — Ambulatory Visit (INDEPENDENT_AMBULATORY_CARE_PROVIDER_SITE_OTHER): Payer: Medicare Other

## 2017-05-18 ENCOUNTER — Encounter (INDEPENDENT_AMBULATORY_CARE_PROVIDER_SITE_OTHER): Payer: Self-pay | Admitting: Orthopaedic Surgery

## 2017-05-18 VITALS — BP 135/83 | HR 75 | Ht 65.0 in | Wt 165.0 lb

## 2017-05-18 DIAGNOSIS — M25511 Pain in right shoulder: Secondary | ICD-10-CM

## 2017-05-18 MED ORDER — METHYLPREDNISOLONE ACETATE 40 MG/ML IJ SUSP
80.0000 mg | INTRAMUSCULAR | Status: AC | PRN
  Administered 2017-05-18: 80 mg

## 2017-05-18 MED ORDER — BUPIVACAINE HCL 0.5 % IJ SOLN
2.0000 mL | INTRAMUSCULAR | Status: AC | PRN
  Administered 2017-05-18: 2 mL via INTRA_ARTICULAR

## 2017-05-18 MED ORDER — LIDOCAINE HCL 1 % IJ SOLN
2.0000 mL | INTRAMUSCULAR | Status: AC | PRN
  Administered 2017-05-18: 2 mL

## 2017-05-18 NOTE — Progress Notes (Signed)
Office Visit Note   Patient: Kathryn Ayala           Date of Birth: [birth year]           MRN: [id number] Visit Date: 05/18/2017              Requested by: Crist Infante, MD [address] [place], [place] [address] PCP: Crist Infante, MD   Assessment & Plan: Visit Diagnoses:  1. Right shoulder pain, unspecified chronicity   2. Acute pain of right shoulder   Impingement syndrome right shoulder with possible rotator cuff tear  Plan: Subacromial cortisone injection right shoulder, follow-up in 2 weeks if no improvement to consider MRI scan  Follow-Up Instructions: Return if symptoms worsen or fail to improve.   Orders:  Orders Placed This Encounter  Procedures  . XR Shoulder Right   No orders of the defined types were placed in this encounter.     Procedures: Large Joint Inj Date/Time: 05/18/2017 4:49 PM Performed by: Garald Balding Authorized by: Garald Balding   Consent Given by:  Patient Timeout: prior to procedure the correct patient, procedure, and site was verified   Indications:  Pain Location:  Shoulder Site:  L subacromial bursa Prep: patient was prepped and draped in usual sterile fashion   Needle Size:  25 G Needle Length:  1.5 inches Approach:  Lateral Ultrasound Guidance: No   Fluoroscopic Guidance: No   Arthrogram: No   Medications:  80 mg methylPREDNISolone acetate 40 MG/ML; 2 mL lidocaine 1 %; 2 mL bupivacaine 0.5 % Aspiration Attempted: No   Patient tolerance:  Patient tolerated the procedure well with no immediate complications     Clinical Data: No additional findings.   Subjective: Chief Complaint  Patient presents with  . Left Shoulder - Pain    Mrs. Kathryn Ayala is an 82 y o that presents with R shoulder pain   Mrs. Nasby noted initial onset of right shoulder pain between 2 and 3 weeks ago without obvious injury or trauma. She was trying to boost herself up from a sitting position when she experienced the pain in her right  shoulder. Since that time it has increased to the point where she is having trouble sleeping and lying on that side. She's having difficulty raising her arm over her head. Denies any numbness or tingling or skin changes. HPI  Review of Systems   Objective: Vital Signs: BP 135/83   Pulse 75   Ht 5\' 5"  (1.651 m)   Wt 165 lb (74.8 kg)   BMI 27.46 kg/m   Physical Exam  Ortho Exam right shoulder positive impingement with internal/external rotation. Able to place her right arm overhead but with her secureness motion. Some pain along the anterior aspect of the shaft subacromial region and at the acromioclavicular joint. Positive empty can testing. Some loss of strength with external rotation. Skin intact. Neurovascular exam intact  Specialty Comments:  No specialty comments available.  Imaging: Xr Shoulder Right  Result Date: 05/18/2017 Films of the right shoulder obtained in the AP and lateral projections. There is a downsloping acromion with decrease in space between the humeral head and acromion. Mild degenerative changes at the acromioclavicular joint. No obvious degenerative changes at the glenohumeral joint. No ectopic calcification. No evidence of fracture. Humeral head is centered in the glenoid    PMFS History: Patient Active Problem List   Diagnosis Date Noted  . Trigger finger, right middle finger 02/16/2017  . Acute pain of right knee  02/16/2017  . 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[place]) 05/26/2016   Past Medical History:  Diagnosis Date  . Anemia    after a pregnancy  . Anxiety   . Arthritis   . Atrial fibrillation ([place])   . Breast cancer, left ([place])   . Complication of anesthesia    rapid heart beat afterwards sometimes  . Depression   . Hypertension   . Kidney stones   . Pneumonia   . 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 Urinary frequency     Family History  Problem Relation Age of Onset  .  Lung cancer Brother     Past Surgical History:  Procedure Laterality Date  . ABDOMINAL HYSTERECTOMY  1996  . APPENDECTOMY  1956  . BREAST BIOPSY Left 05/07/2016  . BREAST BIOPSY Bilateral 1956, 1980  . BREAST LUMPECTOMY WITH RADIOACTIVE SEED LOCALIZATION Left 06/16/2016   Procedure: LEFT BREAST LUMPECTOMY WITH RADIOACTIVE SEED LOCALIZATION;  Surgeon: Autumn Messing III, MD;  Location: [place];  Service: General;  Laterality: Left;  . COLONOSCOPY    . KIDNEY STONE SURGERY    . KNEE SURGERY  11/20/2004  . WRIST SURGERY Left    Social History   Occupational History  . Not on file.   Social History Main Topics  . Smoking status: Never Smoker  . Smokeless tobacco: Never Used  . Alcohol use No  . Drug use: No  . Sexual activity: Not on file     Garald Balding, MD   Note - This record has been created using [employer].  Chart creation errors have been sought, but may not always  have been located. Such creation errors do not reflect on  the standard of medical care.

## 2017-06-24 DIAGNOSIS — R3121 Asymptomatic microscopic hematuria: Secondary | ICD-10-CM | POA: Insufficient documentation

## 2017-07-11 ENCOUNTER — Other Ambulatory Visit: Payer: Self-pay | Admitting: Hematology and Oncology

## 2017-07-13 ENCOUNTER — Ambulatory Visit (INDEPENDENT_AMBULATORY_CARE_PROVIDER_SITE_OTHER): Payer: Medicare Other | Admitting: Orthopaedic Surgery

## 2017-07-15 ENCOUNTER — Other Ambulatory Visit: Payer: Self-pay | Admitting: Hematology and Oncology

## 2017-07-17 ENCOUNTER — Other Ambulatory Visit: Payer: Self-pay | Admitting: Hematology and Oncology

## 2017-07-23 ENCOUNTER — Encounter (INDEPENDENT_AMBULATORY_CARE_PROVIDER_SITE_OTHER): Payer: Self-pay | Admitting: Orthopaedic Surgery

## 2017-07-23 ENCOUNTER — Ambulatory Visit (INDEPENDENT_AMBULATORY_CARE_PROVIDER_SITE_OTHER): Payer: Medicare Other | Admitting: Orthopaedic Surgery

## 2017-07-23 VITALS — BP 115/72 | HR 70 | Resp 12 | Ht 63.0 in | Wt 160.0 lb

## 2017-07-23 DIAGNOSIS — M1711 Unilateral primary osteoarthritis, right knee: Secondary | ICD-10-CM | POA: Diagnosis not present

## 2017-07-23 MED ORDER — METHYLPREDNISOLONE ACETATE 40 MG/ML IJ SUSP
80.0000 mg | INTRAMUSCULAR | Status: AC | PRN
  Administered 2017-07-23: 80 mg

## 2017-07-23 MED ORDER — BUPIVACAINE HCL 0.5 % IJ SOLN
3.0000 mL | INTRAMUSCULAR | Status: AC | PRN
  Administered 2017-07-23: 3 mL via INTRA_ARTICULAR

## 2017-07-23 MED ORDER — LIDOCAINE HCL 1 % IJ SOLN
5.0000 mL | INTRAMUSCULAR | Status: AC | PRN
  Administered 2017-07-23: 5 mL

## 2017-07-23 NOTE — Progress Notes (Signed)
Office Visit Note   Patient: Kathryn Ayala           Date of Birth: 1935-06-16           MRN: [id number] Visit Date: 07/23/2017              Requested by: Crist Infante, MD [address] [place], [place] [address] PCP: Crist Infante, MD   Assessment & Plan: Visit Diagnoses:  1. Unilateral primary osteoarthritis, right knee     Plan: A long discussion regarding the osteoarthritis in her right knee and different treatment options. She's still not ready for a knee replacement. She has check with the insurance company regarding Visco supplementation and would cost her $50 per injection. Today she like to have another cortisone injection.  Follow-Up Instructions: Return if symptoms worsen or fail to improve.   Orders:  No orders of the defined types were placed in this encounter.  No orders of the defined types were placed in this encounter.     Procedures: Large Joint Inj Date/Time: 07/23/2017 11:42 AM Performed by: Garald Balding Authorized by: Garald Balding   Consent Given by:  Patient Timeout: prior to procedure the correct patient, procedure, and site was verified   Indications:  Pain and joint swelling Location:  Knee Site:  R knee Prep: patient was prepped and draped in usual sterile fashion   Needle Size:  25 G Needle Length:  1.5 inches Approach:  Anteromedial Ultrasound Guidance: No   Fluoroscopic Guidance: No   Arthrogram: No   Medications:  5 mL lidocaine 1 %; 80 mg methylPREDNISolone acetate 40 MG/ML; 3 mL bupivacaine 0.5 % Aspiration Attempted: No   Patient tolerance:  Patient tolerated the procedure well with no immediate complications     Clinical Data: No additional findings.   Subjective: Chief Complaint  Patient presents with  . Right Knee - Pain, Weakness    Kathryn Ayala is an 82 y o that  Presents with chronic right knee pain. She had a cortisone injection 01/2017 in R knee and it lasted 2 months. No fever, chills or calf pain.     HPI  Review of Systems  Constitutional: Negative for chills, fatigue and fever.  Eyes: Negative for itching.  Respiratory: Negative for chest tightness and shortness of breath.   Cardiovascular: Negative for chest pain, palpitations and leg swelling.  Gastrointestinal: Negative for blood in stool, constipation and diarrhea.  Endocrine: Negative for polyuria.  Genitourinary: Negative for dysuria.  Musculoskeletal: Negative for back pain, joint swelling, neck pain and neck stiffness.  Allergic/Immunologic: Negative for immunocompromised state.  Neurological: Positive for weakness. Negative for dizziness and numbness.  Hematological: Does not bruise/bleed easily.  Psychiatric/Behavioral: The patient is nervous/anxious.      Objective: Vital Signs: BP 115/72   Pulse 70   Resp 12   Ht 5\' 3"  (1.6 m)   Wt 160 lb (72.6 kg)   BMI 28.34 kg/m   Physical Exam  Ortho Exam awake alert and oriented 3. Comfortable sitting position. Walks with minimal limp reference to the right knee. Large knees particularly with a lot of adipose tissue medially. Some medial lateral joint pain. Mild patella crepitation. No instability. Skin intact. Neurovascular exam intact. No swelling distally. Full extension and flexion over 100.  Specialty Comments:  No specialty comments available.  Imaging: No results found.   PMFS History: Patient Active Problem List   Diagnosis Date Noted  . Trigger finger, right middle finger 02/16/2017  . Acute  pain of right knee 02/16/2017  . 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[place]) 05/26/2016   Past Medical History:  Diagnosis Date  . Anemia    after a pregnancy  . Anxiety   . Arthritis   . Atrial fibrillation ([place])   . Breast cancer, left ([place])   . Complication of anesthesia    rapid heart beat afterwards sometimes  . Depression   . Hypertension   . Kidney stones   . Pneumonia   . 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 Urinary frequency     Family History  Problem Relation Age of Onset  . Lung cancer Brother     Past Surgical History:  Procedure Laterality Date  . ABDOMINAL HYSTERECTOMY  1996  . APPENDECTOMY  1956  . BREAST BIOPSY Left 05/07/2016  . BREAST BIOPSY Bilateral 1956, 1980  . BREAST LUMPECTOMY WITH RADIOACTIVE SEED LOCALIZATION Left 06/16/2016   Procedure: LEFT BREAST LUMPECTOMY WITH RADIOACTIVE SEED LOCALIZATION;  Surgeon: Autumn Messing III, MD;  Location: [place];  Service: General;  Laterality: Left;  . COLONOSCOPY    . KIDNEY STONE SURGERY    . KNEE SURGERY  11/20/2004  . WRIST SURGERY Left    Social History   Occupational History  . Not on file.   Social History Main Topics  . Smoking status: Never Smoker  . Smokeless tobacco: Never Used  . Alcohol use No  . Drug use: No  . Sexual activity: Not on file

## 2017-09-24 DIAGNOSIS — H6120 Impacted cerumen, unspecified ear: Secondary | ICD-10-CM | POA: Insufficient documentation

## 2017-12-29 DIAGNOSIS — L309 Dermatitis, unspecified: Secondary | ICD-10-CM | POA: Insufficient documentation

## 2018-04-06 ENCOUNTER — Encounter (INDEPENDENT_AMBULATORY_CARE_PROVIDER_SITE_OTHER): Payer: Self-pay | Admitting: Orthopaedic Surgery

## 2018-04-06 ENCOUNTER — Ambulatory Visit (INDEPENDENT_AMBULATORY_CARE_PROVIDER_SITE_OTHER): Payer: Medicare Other | Admitting: Orthopaedic Surgery

## 2018-04-06 VITALS — BP 111/65 | HR 63 | Ht 63.0 in | Wt 165.0 lb

## 2018-04-06 DIAGNOSIS — M1711 Unilateral primary osteoarthritis, right knee: Secondary | ICD-10-CM

## 2018-04-06 MED ORDER — METHYLPREDNISOLONE ACETATE 40 MG/ML IJ SUSP
80.0000 mg | INTRAMUSCULAR | Status: AC | PRN
  Administered 2018-04-06: 80 mg

## 2018-04-06 MED ORDER — BUPIVACAINE HCL 0.5 % IJ SOLN
2.0000 mL | INTRAMUSCULAR | Status: AC | PRN
  Administered 2018-04-06: 2 mL via INTRA_ARTICULAR

## 2018-04-06 MED ORDER — LIDOCAINE HCL 1 % IJ SOLN
2.0000 mL | INTRAMUSCULAR | Status: AC | PRN
  Administered 2018-04-06: 2 mL

## 2018-04-06 NOTE — Progress Notes (Signed)
Office Visit Note   Patient: Kathryn Ayala           Date of Birth: 1935/01/08           MRN: [id number] Visit Date: 04/06/2018              Requested by: Crist Infante, MD [address] [place], [place] [address] PCP: Crist Infante, MD   Assessment & Plan: Visit Diagnoses:  1. Unilateral primary osteoarthritis, right knee     Plan: Repeat cortisone injection right knee.  Long discussion regarding diagnosis and treatment options including total knee replacement.  Kathryn Ayala would like to consist continue with the injections rather than consider surgery at this point.  She also is having some recurrent problems with her right shoulder.  Films last year demonstrated some decreased space between the humeral head and the acromium consistent with a chronic rotator cuff tear.  She did well with a subacromial cortisone injection but has had some recurrent pain.  She like to review that again in 2 weeks.  Return in office 2 weeks  Follow-Up Instructions: Return in about 2 weeks (around 04/20/2018).   Orders:  Orders Placed This Encounter  Procedures  . Large Joint Inj: R knee   No orders of the defined types were placed in this encounter.     Procedures: Large Joint Inj: R knee on 04/06/2018 2:10 PM Indications: pain and diagnostic evaluation Details: 25 G 1.5 in needle, anteromedial approach  Arthrogram: No  Medications: 2 mL lidocaine 1 %; 2 mL bupivacaine 0.5 %; 80 mg methylPREDNISolone acetate 40 MG/ML Procedure, treatment alternatives, risks and benefits explained, specific risks discussed. Consent was given by the patient. Immediately prior to procedure a time out was called to verify the correct patient, procedure, equipment, support staff and site/side marked as required. Patient was prepped and draped in the usual sterile fashion.       Clinical Data: No additional findings.   Subjective: Chief Complaint  Patient presents with  . Right Knee - Pain  . Follow-up   R KNEE PAIN FOR YEARS LAST INJECTION WAS 02/16/17  Kathryn Ayala has an established diagnosis of osteoarthritis in her right knee.  She has had prior cortisone injections with good relief.  Had some recurrent pain to the walking and sleeping because of pain beneath department.  She like to consider another injection.  She is aware of an option of knee replacement but is "not ready yet". She also has had some recurrent pain in her right shoulder.  She was seen last year with x-rays demonstrating a decreased space between the humeral head and the acromium possibly consistent with a chronic rotator cuff tear.  She had a subacromial cortisone injection with good relief.  Now she is having recurrent pain. HPI  Review of Systems  Constitutional: Negative for fatigue and fever.  HENT: Negative for ear pain.   Eyes: Negative for pain.  Respiratory: Negative for cough and shortness of breath.   Cardiovascular: Positive for leg swelling.  Gastrointestinal: Negative for constipation and diarrhea.  Genitourinary: Negative for difficulty urinating.  Musculoskeletal: Negative for back pain and neck pain.  Skin: Negative for rash.  Neurological: Negative for weakness and numbness.  Hematological: Does not bruise/bleed easily.  Psychiatric/Behavioral: Negative for sleep disturbance.     Objective: Vital Signs: BP 111/65 (BP Location: Left Arm, Patient Position: Sitting, Cuff Size: Normal)   Pulse 63   Ht 5\' 3"  (1.6 m)   Wt 165 lb (74.8  kg)   BMI 29.23 kg/m   Physical Exam  Constitutional: She is oriented to person, place, and time. She appears well-developed and well-nourished.  HENT:  Mouth/Throat: Oropharynx is clear and moist.  Eyes: Pupils are equal, round, and reactive to light. EOM are normal.  Pulmonary/Chest: Effort normal.  Neurological: She is alert and oriented to person, place, and time.  Skin: Skin is warm and dry.  Psychiatric: She has a normal mood and affect. Her behavior is normal.      Ortho Exam awake alert and oriented x3.  Comfortable sitting.  Right knee with full extension.  Some crepitation.  Flexed about 95 degrees.  Large legs.  Calf with touch thigh at 95 degrees.  No opening with varus valgus stress.  Predominant pain along the anterior medial joint line.  No calf pain.  Mild distal edema Positive impingement right shoulder with pain on extremes of internal/external rotation.  Weakness with overhead motion.  No evidence of adhesive capsulitis.  Good grip and good release.  Specialty Comments:  No specialty comments available.  Imaging: No results found.   PMFS History: Patient Active Problem List   Diagnosis Date Noted  . Unilateral primary osteoarthritis, right knee 04/06/2018  . Trigger finger, right middle finger 02/16/2017  . Acute pain of right knee 02/16/2017  . 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[place]) 05/26/2016   Past Medical History:  Diagnosis Date  . Anemia    after a pregnancy  . Anxiety   . Arthritis   . Atrial fibrillation ([place])   . Breast cancer, left ([place])   . Complication of anesthesia    rapid heart beat afterwards sometimes  . Depression   . Hypertension   . Kidney stones   . Pneumonia   . 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 Urinary frequency     Family History  Problem Relation Age of Onset  . Lung cancer Brother     Past Surgical History:  Procedure Laterality Date  . ABDOMINAL HYSTERECTOMY  1996  . APPENDECTOMY  1956  . BREAST BIOPSY Left 05/07/2016  . BREAST BIOPSY Bilateral 1956, 1980  . BREAST LUMPECTOMY WITH RADIOACTIVE SEED LOCALIZATION Left 06/16/2016   Procedure: LEFT BREAST LUMPECTOMY WITH RADIOACTIVE SEED LOCALIZATION;  Surgeon: Autumn Messing III, MD;  Location: [facility];  Service: General;  Laterality: Left;  . COLONOSCOPY    . KIDNEY STONE SURGERY    . KNEE SURGERY  11/20/2004  . WRIST SURGERY Left    Social History   Occupational History   . Not on file  Tobacco Use  . Smoking status: Never Smoker  . Smokeless tobacco: Never Used  Substance and Sexual Activity  . Alcohol use: No  . Drug use: No  . Sexual activity: Not on file

## 2018-04-26 ENCOUNTER — Ambulatory Visit (INDEPENDENT_AMBULATORY_CARE_PROVIDER_SITE_OTHER): Payer: Medicare Other | Admitting: Orthopaedic Surgery

## 2018-04-28 NOTE — Assessment & Plan Note (Signed)
Left lumpectomy 06/16/2016: DCIS high-grade, 1.5 cm, margins clear, ER 95%, PR 80%, Tis Nx stage 0  Recommendation: adjuvant Tamoxifen 20 mg daily 5 years  Prognosis: Based on [facility] nomogram for DCIS 1. No further treatment: 5 year risk 7%, 10 year risk 11% 2. radiation alone: 5 year risk 3%, 10 year risk 4% 3. Antiestrogen therapy alone: 5 year risk 3%, 10 year risk 5% 4. Both radiation and antiestrogen therapy: 5 year risk 1%, 10 year risk 2%  Current treatment: Tamoxifen 20 mg daily (Didnot need XRT) Tamoxifen Toxicities:  Breast cancer surveillance: 1. Breast exam: Benign 2. Mammogram 04/29/17  RTC in 1 year

## 2018-04-29 ENCOUNTER — Telehealth: Payer: Self-pay | Admitting: Hematology and Oncology

## 2018-04-29 ENCOUNTER — Inpatient Hospital Stay: Payer: Medicare Other | Attending: Hematology and Oncology | Admitting: Hematology and Oncology

## 2018-04-29 DIAGNOSIS — Z170 Estrogen receptor positive status [ER+]: Secondary | ICD-10-CM

## 2018-04-29 DIAGNOSIS — C50212 Malignant neoplasm of upper-inner quadrant of left female breast: Secondary | ICD-10-CM | POA: Diagnosis present

## 2018-04-29 DIAGNOSIS — Z79810 Long term (current) use of selective estrogen receptor modulators (SERMs): Secondary | ICD-10-CM

## 2018-04-29 MED ORDER — TRIAMTERENE-HCTZ 37.5-25 MG PO TABS: 0.5000 | ORAL_TABLET | Freq: Every day | ORAL | Status: DC

## 2018-04-29 MED ORDER — TAMOXIFEN CITRATE 20 MG PO TABS: 20.0000 mg | ORAL_TABLET | Freq: Every day | ORAL | 3 refills | Status: DC

## 2018-04-29 MED ORDER — AMLODIPINE BESY-BENAZEPRIL HCL 5-20 MG PO CAPS: 1.0000 | ORAL_CAPSULE | Freq: Every day | ORAL | Status: DC

## 2018-04-29 NOTE — Progress Notes (Signed)
Patient Care Team: Crist Infante, MD as PCP - General (Internal Medicine) Nicholas Lose, MD as Consulting Physician (Hematology and Oncology) Delice Bison Charlestine Massed, NP as Nurse Practitioner (Hematology and Oncology) Jovita Kussmaul, MD as Consulting Physician (General Surgery)  DIAGNOSIS:  Encounter Diagnosis  Name Primary?  . Malignant neoplasm of upper-inner quadrant of left breast in female, estrogen receptor positive ([place])     SUMMARY OF ONCOLOGIC HISTORY:   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[place])   05/02/2016 Mammogram    There are 3.2 cm grouped calcifications in the left breast at 12:00 posterior depth. There is architectural distortion.      05/07/2016 Initial Diagnosis    Left breast biopsy: DCIS with calcifications, high-grade, ER 95%, PR 80%, Tis N0 stage 0      06/16/2016 Surgery    Left lumpectomy: DCIS high-grade, 1.5 cm, margins clear, ER 95%, PR 80%, Tis Nx stage 0      07/28/2016 -  Anti-estrogen oral therapy    Tamoxifen 20 mg daily (Didnot need XRT)       CHIEF COMPLIANT: Follow-up on tamoxifen therapy  INTERVAL HISTORY: Kathryn Ayala is a 83 year old with above-mentioned his left breast DCIS who is currently on antiestrogen therapy with tamoxifen.  She appears to be tolerating extremely well.  She does not have any hot flashes.  She does complain of hair loss.  Denies any lumps or nodules in the breast.  REVIEW OF SYSTEMS:   Constitutional: Denies fevers, chills or abnormal weight loss, complains of hair loss Eyes: Denies blurriness of vision Ears, nose, mouth, throat, and face: Denies mucositis or sore throat Respiratory: Denies cough, dyspnea or wheezes Cardiovascular: Denies palpitation, chest discomfort Gastrointestinal:  Denies nausea, heartburn or change in bowel habits Skin: Denies abnormal skin rashes Lymphatics: Denies new lymphadenopathy or easy bruising Neurological:Denies numbness, tingling or new  weaknesses Behavioral/Psych: Mood is stable, no new changes  Extremities: Arthritis in the knees Breast:  denies any pain or lumps or nodules in either breasts All other systems were reviewed with the patient and are negative.  I have reviewed the past medical history, past surgical history, social history and family history with the patient and they are unchanged from previous note.  ALLERGIES:  is allergic to penicillins; codeine; levaquin [levofloxacin]; and sulfur.  MEDICATIONS:  Current Outpatient Medications  Medication Sig Dispense Refill  . allopurinol (ZYLOPRIM) 100 MG tablet Take 100 mg by mouth 2 (two) times daily.   3  . amLODipine-benazepril (LOTREL) 5-20 MG capsule Take 1 capsule by mouth daily.    Marland Kitchen atenolol (TENORMIN) 50 MG tablet Take 50 mg by mouth every morning.   2  . benazepril (LOTENSIN) 10 MG tablet Take 5 mg by mouth daily.   3  . ELIQUIS 5 MG TABS tablet Take 5 mg by mouth 2 (two) times daily.  11  . rosuvastatin (CRESTOR) 20 MG tablet Take 20 mg by mouth daily.  6  . tamoxifen (NOLVADEX) 20 MG tablet Take 1 tablet (20 mg total) by mouth daily. 90 tablet 3  . triamterene-hydrochlorothiazide (MAXZIDE-25) 37.5-25 MG tablet Take 0.5 tablets by mouth daily.    Marland Kitchen venlafaxine XR (EFFEXOR-XR) 75 MG 24 hr capsule Take by mouth daily.  11   No current facility-administered medications for this visit.     PHYSICAL EXAMINATION: ECOG PERFORMANCE STATUS: 1 - Symptomatic but completely ambulatory  Vitals:   04/29/18 1117  BP: 126/60  Pulse: 71  Resp: 16  Temp:  98.5 F (36.9 C)  SpO2: 98%   Filed Weights   04/29/18 1117  Weight: 175 lb (79.4 kg)    GENERAL:alert, no distress and comfortable SKIN: skin color, texture, turgor are normal, no rashes or significant lesions EYES: normal, Conjunctiva are pink and non-injected, sclera clear OROPHARYNX:no exudate, no erythema and lips, buccal mucosa, and tongue normal  NECK: supple, thyroid normal size, non-tender,  without nodularity LYMPH:  no palpable lymphadenopathy in the cervical, axillary or inguinal LUNGS: clear to auscultation and percussion with normal breathing effort HEART: regular rate & rhythm and no murmurs and no lower extremity edema ABDOMEN:abdomen soft, non-tender and normal bowel sounds MUSCULOSKELETAL:no cyanosis of digits and no clubbing  NEURO: alert & oriented x 3 with fluent speech, no focal motor/sensory deficits EXTREMITIES: No lower extremity edema BREAST: No palpable masses or nodules in either right or left breasts. No palpable axillary supraclavicular or infraclavicular adenopathy no breast tenderness or nipple discharge. (exam performed in the presence of a chaperone)  LABORATORY DATA:  I have reviewed the data as listed CMP Latest Ref Rng & Units 06/10/2016  Glucose 65 - 99 mg/dL 122(H)  BUN 6 - 20 mg/dL 16  Creatinine 0.44 - 1.00 mg/dL 0.84  Sodium 135 - 145 mmol/L 138  Potassium 3.5 - 5.1 mmol/L 3.7  Chloride 101 - 111 mmol/L 108  CO2 22 - 32 mmol/L 22  Calcium 8.9 - 10.3 mg/dL 9.7    Lab Results  Component Value Date   WBC 6.7 06/10/2016   HGB 14.2 06/10/2016   HCT 44.9 06/10/2016   MCV 93.9 06/10/2016   PLT 175 06/10/2016    ASSESSMENT & PLAN:  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[place]) Left lumpectomy 06/16/2016: DCIS high-grade, 1.5 cm, margins clear, ER 95%, PR 80%, Tis Nx stage 0  Recommendation: adjuvant Tamoxifen 20 mg daily 5 years  Prognosis: Based on [facility] nomogram for DCIS 1. No further treatment: 5 year risk 7%, 10 year risk 11% 2. radiation alone: 5 year risk 3%, 10 year risk 4% 3. Antiestrogen therapy alone: 5 year risk 3%, 10 year risk 5% 4. Both radiation and antiestrogen therapy: 5 year risk 1%, 10 year risk 2%  Current treatment: Tamoxifen 20 mg daily (Didnot need XRT) Tamoxifen Toxicities: Hair loss: Instructed her to use coconut oil.  She is already taking biotin supplement.  Breast cancer  surveillance: 1. Breast exam: 04/29/2018 benign 2. Mammogram 04/29/17: At [facility] normal  RTC in 1 year  No orders of the defined types were placed in this encounter.  The patient has a good understanding of the overall plan. she agrees with it. she will call with any problems that may develop before the next visit here.   Harriette Ohara, MD 04/29/18

## 2018-04-29 NOTE — Telephone Encounter (Signed)
Gave patient avs and calendar of upcoming July 2020 appts.  °

## 2018-05-10 ENCOUNTER — Telehealth (INDEPENDENT_AMBULATORY_CARE_PROVIDER_SITE_OTHER): Payer: Self-pay | Admitting: Orthopaedic Surgery

## 2018-05-10 NOTE — Telephone Encounter (Signed)
Apply for Bil knees, Euflexxa preferred for Dr. Durward Fortes. Thank you.

## 2018-05-10 NOTE — Telephone Encounter (Signed)
Patient called requesting to talk to you directly regarding "the problem with her knees."

## 2018-05-11 NOTE — Telephone Encounter (Signed)
Noted  

## 2018-05-27 ENCOUNTER — Telehealth (INDEPENDENT_AMBULATORY_CARE_PROVIDER_SITE_OTHER): Payer: Self-pay

## 2018-05-27 NOTE — Telephone Encounter (Signed)
Submitted for VOB, Euflexxa series, bilateral knee.

## 2018-06-03 ENCOUNTER — Telehealth (INDEPENDENT_AMBULATORY_CARE_PROVIDER_SITE_OTHER): Payer: Self-pay | Admitting: Orthopaedic Surgery

## 2018-06-03 NOTE — Telephone Encounter (Signed)
I called patient, patient wants to know status of visco, message sent to April Jackson.

## 2018-06-03 NOTE — Telephone Encounter (Signed)
Patient requesting a call from Kathryn Ayala about a few things she was checking on for patient. Please call when available.

## 2018-06-03 NOTE — Telephone Encounter (Signed)
Please advise 

## 2018-06-07 ENCOUNTER — Telehealth (INDEPENDENT_AMBULATORY_CARE_PROVIDER_SITE_OTHER): Payer: Self-pay

## 2018-06-07 NOTE — Telephone Encounter (Signed)
Called and left VM advising patient that she has been approved for Euflexxa series and she will receive a phone call to schedule an appointment.

## 2018-06-07 NOTE — Telephone Encounter (Signed)
Can you please schedule appointment with Dr. Durward Fortes for Kathryn Ayala injection, Bilateral Knee?  Patient is approved for Kathryn Ayala series, bilateral knee. Buy & Bill Covered at 100% of allowable amount $90.00 Co-pay for each visit No PA required

## 2018-06-09 ENCOUNTER — Telehealth (INDEPENDENT_AMBULATORY_CARE_PROVIDER_SITE_OTHER): Payer: Self-pay | Admitting: Orthopaedic Surgery

## 2018-06-09 ENCOUNTER — Ambulatory Visit (INDEPENDENT_AMBULATORY_CARE_PROVIDER_SITE_OTHER): Payer: Medicare Other | Admitting: Orthopedic Surgery

## 2018-06-09 VITALS — BP 103/57 | HR 90

## 2018-06-09 DIAGNOSIS — Z8739 Personal history of other diseases of the musculoskeletal system and connective tissue: Secondary | ICD-10-CM

## 2018-06-09 DIAGNOSIS — M1711 Unilateral primary osteoarthritis, right knee: Secondary | ICD-10-CM

## 2018-06-09 DIAGNOSIS — M1712 Unilateral primary osteoarthritis, left knee: Secondary | ICD-10-CM

## 2018-06-09 DIAGNOSIS — M170 Bilateral primary osteoarthritis of knee: Secondary | ICD-10-CM

## 2018-06-09 MED ORDER — SODIUM HYALURONATE (VISCOSUP) 20 MG/2ML IX SOSY
20.0000 mg | PREFILLED_SYRINGE | INTRA_ARTICULAR | Status: AC | PRN
  Administered 2018-06-09: 20 mg via INTRA_ARTICULAR

## 2018-06-09 MED ORDER — LIDOCAINE HCL 1 % IJ SOLN
2.0000 mL | INTRAMUSCULAR | Status: AC | PRN
  Administered 2018-06-09: 2 mL

## 2018-06-09 NOTE — Telephone Encounter (Signed)
I called patient, patient in severe pain, rescheduled 1st visco appt for 06/09/18

## 2018-06-09 NOTE — Telephone Encounter (Signed)
Patient called and scheduled her Euflexxa injections, but is requesting a return call to get more information on the shots.

## 2018-06-09 NOTE — Progress Notes (Signed)
Office Visit Note   Patient: Kathryn Ayala           Date of Birth: 08-Jun-1935           MRN: [id number] Visit Date: 06/09/2018              Requested by: Crist Infante, MD [address] [place], [place] [address] PCP: Crist Infante, MD   Assessment & Plan: Visit Diagnoses:  1. Unilateral primary osteoarthritis, right knee   2. H/O: osteoarthritis     Plan:  #1: Euflexxa injections were given bilaterally and tolerated procedure well. #2: Follow back up in 1 week and at that time we will                                      obtain an x-ray of the left knee.  Follow-Up Instructions: Return in about 1 week (around 06/16/2018).   Orders:  No orders of the defined types were placed in this encounter.  No orders of the defined types were placed in this encounter.     Procedures: Large Joint Inj: bilateral knee on 06/09/2018 1:08 PM Indications: pain and joint swelling Details: 25 G 1.5 in needle, anteromedial approach  Arthrogram: No  Medications (Right): 2 mL lidocaine 1 %; 20 mg Sodium Hyaluronate 20 MG/2ML Medications (Left): 2 mL lidocaine 1 %; 20 mg Sodium Hyaluronate 20 MG/2ML Outcome: tolerated well, no immediate complications Procedure, treatment alternatives, risks and benefits explained, specific risks discussed. Consent was given by the patient. Immediately prior to procedure a time out was called to verify the correct patient, procedure, equipment, support staff and site/side marked as required. Patient was prepped and draped in the usual sterile fashion.       Clinical Data: No additional findings.   Subjective: Chief Complaint  Patient presents with  . Left Knee - Pain  . Right Knee - Pain  . Knee Pain    Bil knee pain # 1 buy and bill    HPI Joint is seen today for bilateral reflux injections.  Review of Systems  Constitutional: Negative for fatigue and fever.  HENT: Negative for ear pain.   Eyes: Negative for pain.  Respiratory: Negative  for cough and shortness of breath.   Cardiovascular: Positive for leg swelling.  Gastrointestinal: Negative for constipation and diarrhea.  Genitourinary: Negative for difficulty urinating.  Musculoskeletal: Negative for back pain and neck pain.  Skin: Negative for rash.  Neurological: Negative for weakness and numbness.  Hematological: Does not bruise/bleed easily.  Psychiatric/Behavioral: Negative for sleep disturbance.     Objective: Vital Signs: BP (!) 103/57 (BP Location: Left Arm, Patient Position: Sitting, Cuff Size: Normal)   Pulse 90   Physical Exam  Constitutional: She is oriented to person, place, and time. She appears well-developed and well-nourished.  HENT:  Mouth/Throat: Oropharynx is clear and moist.  Eyes: Pupils are equal, round, and reactive to light. EOM are normal.  Pulmonary/Chest: Effort normal.  Neurological: She is alert and oriented to person, place, and time.  Skin: Skin is warm and dry.  Psychiatric: She has a normal mood and affect. Her behavior is normal.    Ortho Exam  Right knee with full extension.  Some crepitation.  Flexed about 95 degrees.  Large legs.  Calf with touch thigh at 95 degrees.  No opening with varus valgus stress.  Predominant pain along the anterior medial joint line.  No calf pain.  Mild distal edema  Specialty Comments:  No specialty comments available.  Imaging: No results found.   PMFS History: Patient Active Problem List   Diagnosis Date Noted  . Unilateral primary osteoarthritis, right knee 04/06/2018  . Trigger finger, right middle finger 02/16/2017  . Acute pain of right knee 02/16/2017  . 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[place]) 05/26/2016   Past Medical History:  Diagnosis Date  . Anemia    after a pregnancy  . Anxiety   . Arthritis   . Atrial fibrillation ([place])   . Breast cancer, left ([place])   . Complication of anesthesia    rapid heart beat afterwards sometimes  .  Depression   . Hypertension   . Kidney stones   . Pneumonia   . 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 Urinary frequency     Family History  Problem Relation Age of Onset  . Lung cancer Brother     Past Surgical History:  Procedure Laterality Date  . ABDOMINAL HYSTERECTOMY  1996  . APPENDECTOMY  1956  . BREAST BIOPSY Left 05/07/2016  . BREAST BIOPSY Bilateral 1956, 1980  . BREAST LUMPECTOMY WITH RADIOACTIVE SEED LOCALIZATION Left 06/16/2016   Procedure: LEFT BREAST LUMPECTOMY WITH RADIOACTIVE SEED LOCALIZATION;  Surgeon: Autumn Messing III, MD;  Location: [facility];  Service: General;  Laterality: Left;  . COLONOSCOPY    . KIDNEY STONE SURGERY    . KNEE SURGERY  11/20/2004  . WRIST SURGERY Left    Social History   Occupational History  . Not on file  Tobacco Use  . Smoking status: Never Smoker  . Smokeless tobacco: Never Used  Substance and Sexual Activity  . Alcohol use: No  . Drug use: No  . Sexual activity: Not on file

## 2018-06-16 ENCOUNTER — Encounter (INDEPENDENT_AMBULATORY_CARE_PROVIDER_SITE_OTHER): Payer: Self-pay | Admitting: Orthopedic Surgery

## 2018-06-16 ENCOUNTER — Ambulatory Visit (INDEPENDENT_AMBULATORY_CARE_PROVIDER_SITE_OTHER): Payer: Medicare Other | Admitting: Orthopedic Surgery

## 2018-06-16 DIAGNOSIS — M1712 Unilateral primary osteoarthritis, left knee: Secondary | ICD-10-CM | POA: Diagnosis not present

## 2018-06-16 DIAGNOSIS — M1711 Unilateral primary osteoarthritis, right knee: Secondary | ICD-10-CM | POA: Diagnosis not present

## 2018-06-16 MED ORDER — SODIUM HYALURONATE (VISCOSUP) 20 MG/2ML IX SOSY
20.0000 mg | PREFILLED_SYRINGE | INTRA_ARTICULAR | Status: AC | PRN
  Administered 2018-06-16: 20 mg via INTRA_ARTICULAR

## 2018-06-16 MED ORDER — LIDOCAINE HCL 1 % IJ SOLN
2.0000 mL | INTRAMUSCULAR | Status: AC | PRN
  Administered 2018-06-16: 2 mL

## 2018-06-16 NOTE — Progress Notes (Signed)
Office Visit Note   Patient: Kathryn Ayala           Date of Birth: [birth year]           MRN: [id number] Visit Date: 06/16/2018              Requested by: Crist Infante, MD [address] [place], [place] [address] PCP: Crist Infante, MD   Assessment & Plan: Visit Diagnoses:  1. Unilateral primary osteoarthritis, left knee   2. Unilateral primary osteoarthritis, right knee     Plan:  #1: Bilateral injections both knees with Euflexxa  Follow-Up Instructions: Return in about 1 week (around 06/23/2018).   Orders:  Orders Placed This Encounter  Procedures  . Large Joint Inj   No orders of the defined types were placed in this encounter.     Procedures: Large Joint Inj: bilateral knee on 06/16/2018 9:30 AM Indications: pain and joint swelling Details: 25 G 1.5 in needle, anteromedial approach  Arthrogram: No  Medications (Right): 2 mL lidocaine 1 %; 20 mg Sodium Hyaluronate 20 MG/2ML Medications (Left): 2 mL lidocaine 1 %; 20 mg Sodium Hyaluronate 20 MG/2ML Outcome: tolerated well, no immediate complications Procedure, treatment alternatives, risks and benefits explained, specific risks discussed. Consent was given by the patient. Immediately prior to procedure a time out was called to verify the correct patient, procedure, equipment, support staff and site/side marked as required. Patient was prepped and draped in the usual sterile fashion.       Clinical Data: No additional findings.   Subjective: Chief Complaint  Patient presents with  . Right Knee - Pain  . Left Knee - Pain    HPI  Seen today for her second Euflexxa injections to the knees.  Denies any reactivity.  Be some benefit is noted.  Review of Systems  Constitutional: Negative for fatigue and fever.  HENT: Negative for ear pain.   Eyes: Negative for pain.  Respiratory: Negative for cough and shortness of breath.   Cardiovascular: Positive for leg swelling.  Gastrointestinal: Negative for  constipation and diarrhea.  Genitourinary: Negative for difficulty urinating.  Musculoskeletal: Negative for back pain and neck pain.  Skin: Negative for rash.  Neurological: Negative for weakness and numbness.  Hematological: Does not bruise/bleed easily.  Psychiatric/Behavioral: Negative for sleep disturbance.     Objective: Vital Signs: There were no vitals taken for this visit.  Physical Exam  Constitutional: She is oriented to person, place, and time. She appears well-developed and well-nourished.  HENT:  Mouth/Throat: Oropharynx is clear and moist.  Eyes: Pupils are equal, round, and reactive to light. EOM are normal.  Pulmonary/Chest: Effort normal.  Neurological: She is alert and oriented to person, place, and time.  Skin: Skin is warm and dry.  Psychiatric: She has a normal mood and affect. Her behavior is normal.    Ortho Exam  Exam today reveals full extension to both knees.  Flexion to about 95 degrees.  No reactivity.  Specialty Comments:  No specialty comments available.  Imaging: No results found.   PMFS History: Current Outpatient Medications  Medication Sig Dispense Refill  . allopurinol (ZYLOPRIM) 100 MG tablet Take 100 mg by mouth 2 (two) times daily.   3  . amLODipine-benazepril (LOTREL) 5-20 MG capsule Take 1 capsule by mouth daily.    Marland Kitchen atenolol (TENORMIN) 50 MG tablet Take 50 mg by mouth every morning.   2  . benazepril (LOTENSIN) 10 MG tablet Take 5 mg by mouth daily.  3  . ELIQUIS 5 MG TABS tablet Take 5 mg by mouth 2 (two) times daily.  11  . rosuvastatin (CRESTOR) 20 MG tablet Take 20 mg by mouth daily.  6  . tamoxifen (NOLVADEX) 20 MG tablet Take 1 tablet (20 mg total) by mouth daily. 90 tablet 3  . triamterene-hydrochlorothiazide (MAXZIDE-25) 37.5-25 MG tablet Take 0.5 tablets by mouth daily.    Marland Kitchen venlafaxine XR (EFFEXOR-XR) 75 MG 24 hr capsule Take by mouth daily.  11   No current facility-administered medications for this visit.      Patient Active Problem List   Diagnosis Date Noted  . Unilateral primary osteoarthritis, right knee 04/06/2018  . Trigger finger, right middle finger 02/16/2017  . Acute pain of right knee 02/16/2017  . 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[place]) 05/26/2016   Past Medical History:  Diagnosis Date  . Anemia    after a pregnancy  . Anxiety   . Arthritis   . Atrial fibrillation ([place])   . Breast cancer, left ([place])   . Complication of anesthesia    rapid heart beat afterwards sometimes  . Depression   . Hypertension   . Kidney stones   . Pneumonia   . 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 Urinary frequency     Family History  Problem Relation Age of Onset  . Lung cancer Brother     Past Surgical History:  Procedure Laterality Date  . ABDOMINAL HYSTERECTOMY  1996  . APPENDECTOMY  1956  . BREAST BIOPSY Left 05/07/2016  . BREAST BIOPSY Bilateral 1956, 1980  . BREAST LUMPECTOMY WITH RADIOACTIVE SEED LOCALIZATION Left 06/16/2016   Procedure: LEFT BREAST LUMPECTOMY WITH RADIOACTIVE SEED LOCALIZATION;  Surgeon: Autumn Messing III, MD;  Location: [facility];  Service: General;  Laterality: Left;  . COLONOSCOPY    . KIDNEY STONE SURGERY    . KNEE SURGERY  11/20/2004  . WRIST SURGERY Left    Social History   Occupational History  . Not on file  Tobacco Use  . Smoking status: Never Smoker  . Smokeless tobacco: Never Used  Substance and Sexual Activity  . Alcohol use: No  . Drug use: No  . Sexual activity: Not on file

## 2018-06-18 ENCOUNTER — Ambulatory Visit (INDEPENDENT_AMBULATORY_CARE_PROVIDER_SITE_OTHER): Payer: Medicare Other | Admitting: Orthopaedic Surgery

## 2018-06-23 ENCOUNTER — Ambulatory Visit (INDEPENDENT_AMBULATORY_CARE_PROVIDER_SITE_OTHER): Payer: Self-pay

## 2018-06-23 ENCOUNTER — Ambulatory Visit (INDEPENDENT_AMBULATORY_CARE_PROVIDER_SITE_OTHER): Payer: Medicare Other | Admitting: Orthopedic Surgery

## 2018-06-23 DIAGNOSIS — M542 Cervicalgia: Secondary | ICD-10-CM | POA: Diagnosis not present

## 2018-06-23 DIAGNOSIS — M47812 Spondylosis without myelopathy or radiculopathy, cervical region: Secondary | ICD-10-CM

## 2018-06-23 DIAGNOSIS — M1712 Unilateral primary osteoarthritis, left knee: Secondary | ICD-10-CM | POA: Diagnosis not present

## 2018-06-23 DIAGNOSIS — M1711 Unilateral primary osteoarthritis, right knee: Secondary | ICD-10-CM | POA: Diagnosis not present

## 2018-06-23 MED ORDER — SODIUM HYALURONATE (VISCOSUP) 20 MG/2ML IX SOSY
20.0000 mg | PREFILLED_SYRINGE | INTRA_ARTICULAR | Status: AC | PRN
  Administered 2018-06-23: 20 mg via INTRA_ARTICULAR

## 2018-06-23 MED ORDER — LIDOCAINE HCL 1 % IJ SOLN
2.0000 mL | INTRAMUSCULAR | Status: AC | PRN
  Administered 2018-06-23: 2 mL

## 2018-06-23 NOTE — Progress Notes (Signed)
Office Visit Note   Patient: Kathryn Ayala           Date of Birth: 01/13/1935           MRN: [id number] Visit Date: 06/23/2018              Requested by: Crist Infante, MD [address] [place], [place] [address] PCP: Crist Infante, MD   Assessment & Plan: Visit Diagnoses:  1. Unilateral primary osteoarthritis, right knee   2. Unilateral primary osteoarthritis, left knee   3. Musculoskeletal neck pain   4. Cervical spine arthritis     Plan:  #1: BilateralEuflexxa injections was given without difficulty #2: We will send her off to [facility] for a cervical collar since she does not like the ones we have. #3: Unable to use anti-inflammatory secondary to requests #4: Follow back up as needed  Follow-Up Instructions: Return if symptoms worsen or fail to improve.   Orders:  Orders Placed This Encounter  Procedures  . Large Joint Inj: bilateral knee  . XR KNEE 3 VIEW LEFT  . XR KNEE 3 VIEW RIGHT  . XR Cervical Spine 2 or 3 views   No orders of the defined types were placed in this encounter.     Procedures: Large Joint Inj: bilateral knee on 06/23/2018 10:29 AM Indications: pain and joint swelling Details: 25 G 1.5 in needle, anteromedial approach  Arthrogram: No  Medications (Right): 20 mg Sodium Hyaluronate 20 MG/2ML; 2 mL lidocaine 1 % Medications (Left): 20 mg Sodium Hyaluronate 20 MG/2ML; 2 mL lidocaine 1 % Outcome: tolerated well, no immediate complications Procedure, treatment alternatives, risks and benefits explained, specific risks discussed. Consent was given by the patient. Immediately prior to procedure a time out was called to verify the correct patient, procedure, equipment, support staff and site/side marked as required. Patient was prepped and draped in the usual sterile fashion.       Clinical Data: No additional findings.   Subjective: Chief Complaint  Patient presents with  . Right Knee - Pain  . Left Knee - Pain  . Neck - Pain    HPI    Joint is seen today for her final injections of Euflexxa bilaterally.  Going to obtain some x-rays today just to reevaluate since his been over a year and a half since her last x-rays.  She also has brought up the fact that she is having pain in the cervical spine now for several days.  It is mainly on the left-hand side she notes decreased range of motion to the left with rotation of the cervical spine.  It does also cause some pain down to the shoulder and some parascapular area on the left.  She states she is limited with her motion.  No recent history of injury or trauma.   Review of Systems  Constitutional: Negative for fatigue and fever.  HENT: Negative for ear pain.   Eyes: Negative for pain.  Respiratory: Negative for cough and shortness of breath.   Cardiovascular: Positive for leg swelling.  Gastrointestinal: Negative for constipation and diarrhea.  Genitourinary: Negative for difficulty urinating.  Musculoskeletal: Negative for back pain and neck pain.  Skin: Negative for rash.  Neurological: Negative for weakness and numbness.  Hematological: Does not bruise/bleed easily.  Psychiatric/Behavioral: Negative for sleep disturbance.     Objective: Vital Signs: There were no vitals taken for this visit.  Physical Exam  Constitutional: She is oriented to person, place, and time. She appears well-developed and  well-nourished.  HENT:  Mouth/Throat: Oropharynx is clear and moist.  Eyes: Pupils are equal, round, and reactive to light. EOM are normal.  Pulmonary/Chest: Effort normal.  Neurological: She is alert and oriented to person, place, and time.  Skin: Skin is warm and dry.  Psychiatric: She has a normal mood and affect. Her behavior is normal.    Ortho Exam  Exam today reveals no reactivity.  Still has some crepitance with range of motion.  Flexes to about 95 degrees.  Exam to the cervical spine reveals only about 20 degrees of left rotation.  Right she can get to about  45 to 50 degrees.  Left rotation does cause her pain into her parascapular area.  He is limited to only about 20 degrees backward extension flexion she can get to about 2 to 3 inches off of her chest.  She has good grip strength bilaterally and equal.  Deep tendon reflexes are 2+ bilateral symmetric in the upper extremities.  Good strength in abduction as well as internal and external rotation at the shoulder.  Specialty Comments:  No specialty comments available.  Imaging: Xr Cervical Spine 2 Or 3 Views  Result Date: 06/23/2018 2 view x-ray of the neck revealed maintenance of disc spaces.  Appears to be have some osteopenia.  Facet changes certainly is significant from C4-5.  Cervical rib is noted on the right.  There is also one on the left.  Xr Knee 3 View Left  Result Date: 06/23/2018 Three-view x-ray of the left knee reveals mild joint space narrowing medially.  Periarticular spurring at the distal medial femoral condyle as well as proximal medial tibial plateau.  She does have some peaked intercondylar spines.  Certainly is some sclerosing more on the proximal tibia.  Mild translation of the femur laterally on the tibia.    PMFS History: Current Outpatient Medications  Medication Sig Dispense Refill  . allopurinol (ZYLOPRIM) 100 MG tablet Take 100 mg by mouth 2 (two) times daily.   3  . amLODipine-benazepril (LOTREL) 5-20 MG capsule Take 1 capsule by mouth daily.    Marland Kitchen atenolol (TENORMIN) 50 MG tablet Take 50 mg by mouth every morning.   2  . benazepril (LOTENSIN) 10 MG tablet Take 5 mg by mouth daily.   3  . ELIQUIS 5 MG TABS tablet Take 5 mg by mouth 2 (two) times daily.  11  . rosuvastatin (CRESTOR) 20 MG tablet Take 20 mg by mouth daily.  6  . tamoxifen (NOLVADEX) 20 MG tablet Take 1 tablet (20 mg total) by mouth daily. 90 tablet 3  . triamterene-hydrochlorothiazide (MAXZIDE-25) 37.5-25 MG tablet Take 0.5 tablets by mouth daily.    Marland Kitchen venlafaxine XR (EFFEXOR-XR) 75 MG 24 hr capsule  Take by mouth daily.  11   No current facility-administered medications for this visit.     Patient Active Problem List   Diagnosis Date Noted  . Unilateral primary osteoarthritis, right knee 04/06/2018  . Trigger finger, right middle finger 02/16/2017  . Acute pain of right knee 02/16/2017  . 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[place]) 05/26/2016   Past Medical History:  Diagnosis Date  . Anemia    after a pregnancy  . Anxiety   . Arthritis   . Atrial fibrillation ([place])   . Breast cancer, left ([place])   . Complication of anesthesia    rapid heart beat afterwards sometimes  . Depression   . Hypertension   . Kidney stones   .  Pneumonia   . 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 Urinary frequency     Family History  Problem Relation Age of Onset  . Lung cancer Brother     Past Surgical History:  Procedure Laterality Date  . ABDOMINAL HYSTERECTOMY  1996  . APPENDECTOMY  1956  . BREAST BIOPSY Left 05/07/2016  . BREAST BIOPSY Bilateral 1956, 1980  . BREAST LUMPECTOMY WITH RADIOACTIVE SEED LOCALIZATION Left 06/16/2016   Procedure: LEFT BREAST LUMPECTOMY WITH RADIOACTIVE SEED LOCALIZATION;  Surgeon: Autumn Messing III, MD;  Location: [facility];  Service: General;  Laterality: Left;  . COLONOSCOPY    . KIDNEY STONE SURGERY    . KNEE SURGERY  11/20/2004  . WRIST SURGERY Left    Social History   Occupational History  . Not on file  Tobacco Use  . Smoking status: Never Smoker  . Smokeless tobacco: Never Used  Substance and Sexual Activity  . Alcohol use: No  . Drug use: No  . Sexual activity: Not on file

## 2018-06-25 ENCOUNTER — Ambulatory Visit (INDEPENDENT_AMBULATORY_CARE_PROVIDER_SITE_OTHER): Payer: Medicare Other | Admitting: Orthopaedic Surgery

## 2018-06-30 ENCOUNTER — Ambulatory Visit (INDEPENDENT_AMBULATORY_CARE_PROVIDER_SITE_OTHER): Payer: Medicare Other | Admitting: Orthopedic Surgery

## 2018-07-02 ENCOUNTER — Ambulatory Visit (INDEPENDENT_AMBULATORY_CARE_PROVIDER_SITE_OTHER): Payer: Medicare Other | Admitting: Orthopaedic Surgery

## 2018-08-05 ENCOUNTER — Telehealth (INDEPENDENT_AMBULATORY_CARE_PROVIDER_SITE_OTHER): Payer: Self-pay | Admitting: Orthopaedic Surgery

## 2018-08-05 NOTE — Telephone Encounter (Signed)
Mailed patient records release form

## 2018-09-19 IMAGING — US US SOFT TISSUE HEAD/NECK
1 series · 11 of 11 positions shown · non-contrast
Comparison: None.

CLINICAL DATA: History of prostate cancer now with palpable area
involving the posterior aspect the right the neck for the past year.

EXAM:
ULTRASOUND OF HEAD/NECK SOFT TISSUES
TECHNIQUE: Ultrasound examination of the head and neck soft tissues was
performed in the area of clinical concern.

[Series 1: us soft tissue head/neck · 0.06mm/px · 11 of 11 slices shown]
[im 1/11]
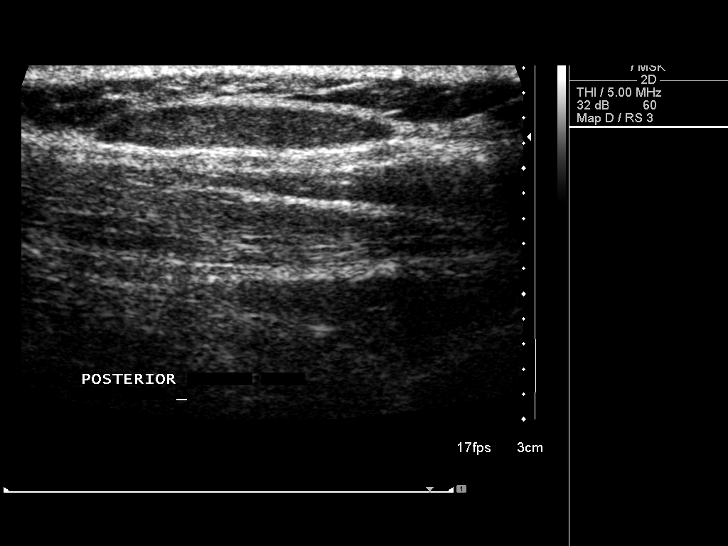
[im 2/11]
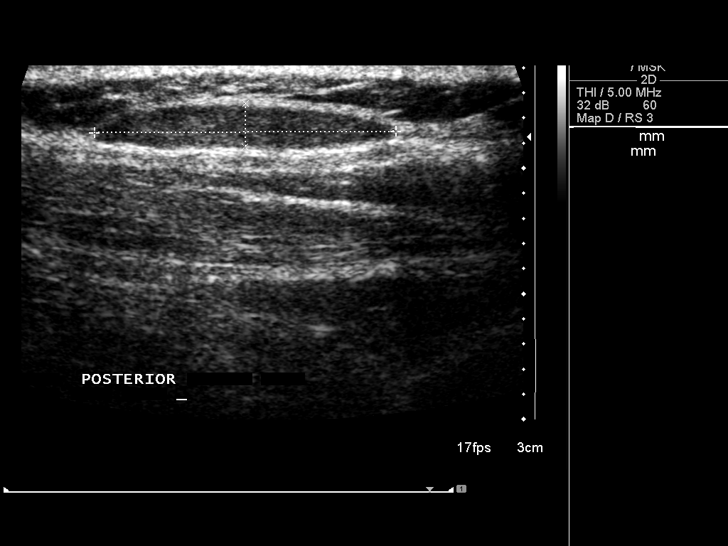
[im 3/11]
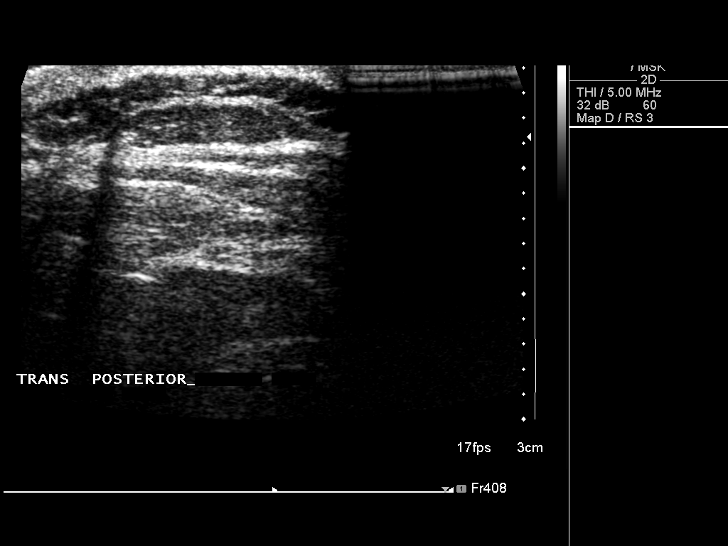
[im 4/11]
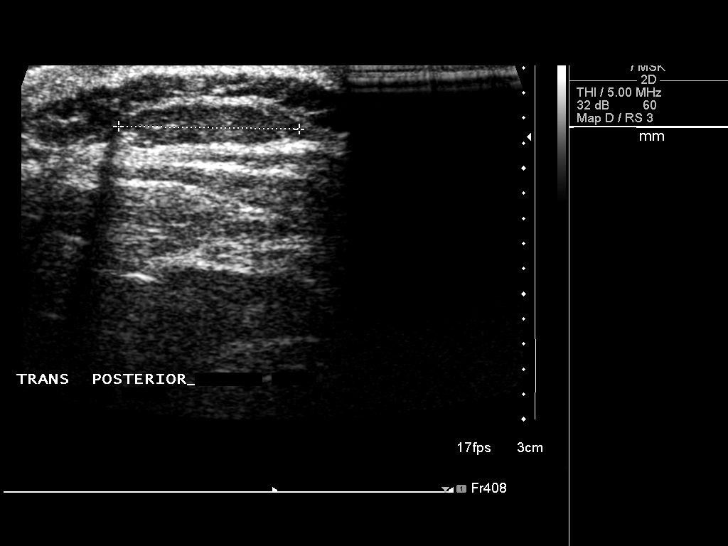
[im 5/11]
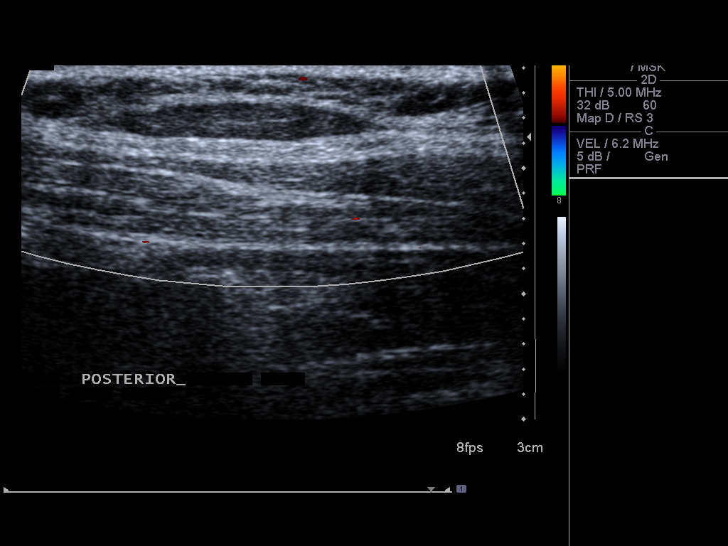
[im 6/11]
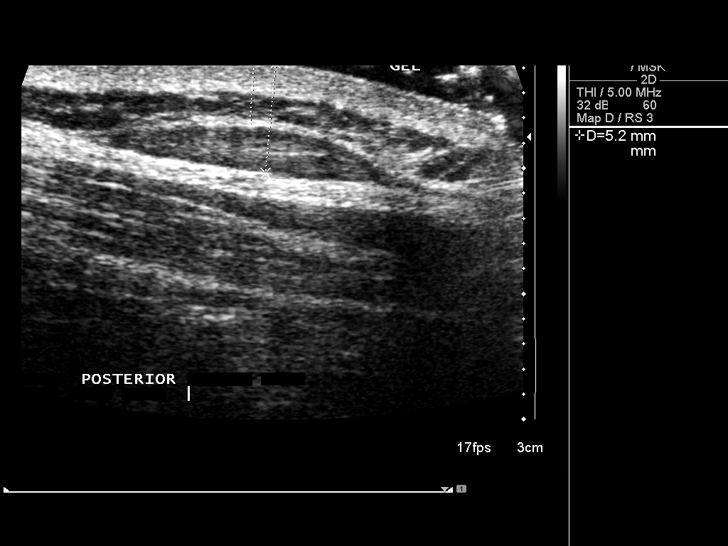
[im 7/11]
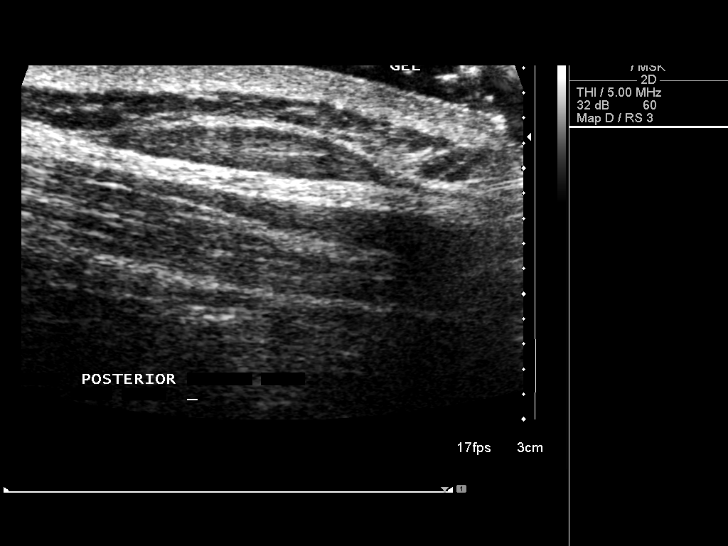
[im 8/11]
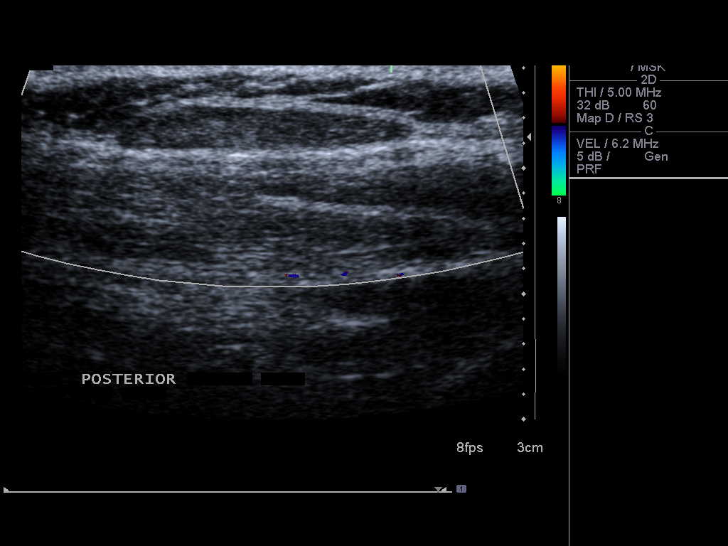
[im 9/11]
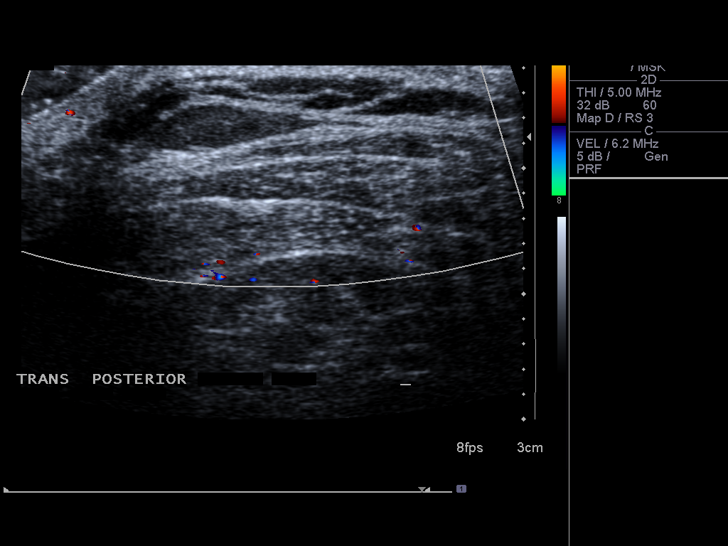
[im 10/11]
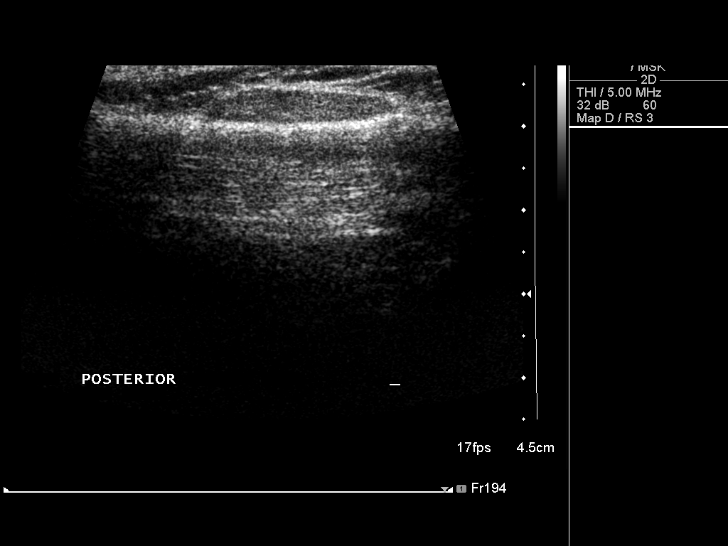
[im 11/11]
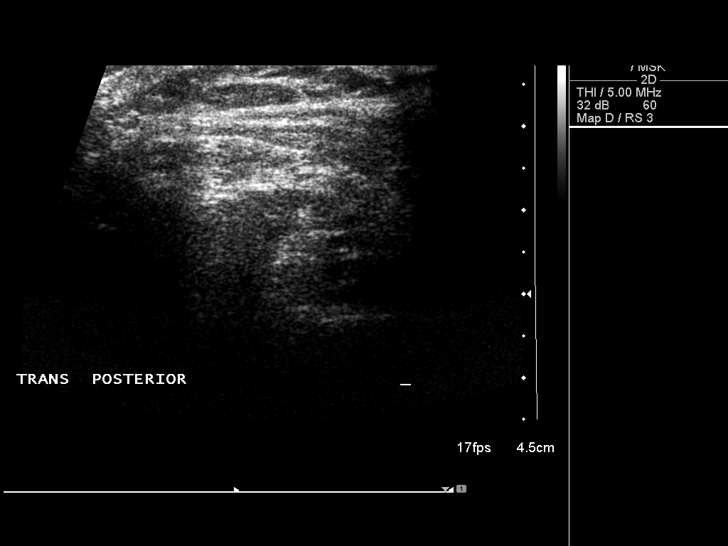

[11 of 11 positions shown; findings below may reference images not displayed]

FINDINGS: There is a well-defined approximately 2.4 x 0.4 x 1.4 cm nodule
within the subcutaneous tissues at the patient's palpable area of
concern. No definitive communication with the overlying dermal
surface.

While this structure is indeterminate, it appears to demonstrate a
fatty hilum (image 7) and is favored to represent a benign, non
pathologically enlarged lymph node.
IMPRESSION: A well-defined approximately 2.4 cm nodule correlates with the
patient's palpable area of concern - while technically indeterminate
on this examination, this structure is favored to represent a
subcutaneous lymph node and demonstrates no discrete worrisome
features.

## 2018-12-24 DIAGNOSIS — M791 Myalgia, unspecified site: Secondary | ICD-10-CM | POA: Insufficient documentation

## 2019-01-14 ENCOUNTER — Other Ambulatory Visit: Payer: Self-pay

## 2019-01-14 MED ORDER — TAMOXIFEN CITRATE 20 MG PO TABS: 20.0000 mg | ORAL_TABLET | Freq: Every day | ORAL | 0 refills | Status: DC

## 2019-02-11 ENCOUNTER — Other Ambulatory Visit: Payer: Self-pay | Admitting: Cardiology

## 2019-02-11 DIAGNOSIS — Z7952 Long term (current) use of systemic steroids: Secondary | ICD-10-CM | POA: Insufficient documentation

## 2019-02-11 MED ORDER — TRIAMTERENE-HCTZ 37.5-25 MG PO TABS: 0.5000 | ORAL_TABLET | Freq: Every day | ORAL | 3 refills | Status: DC

## 2019-02-18 ENCOUNTER — Other Ambulatory Visit: Payer: Self-pay

## 2019-02-18 DIAGNOSIS — I10 Essential (primary) hypertension: Secondary | ICD-10-CM

## 2019-02-21 ENCOUNTER — Other Ambulatory Visit: Payer: Self-pay

## 2019-02-21 MED ORDER — TRIAMTERENE-HCTZ 37.5-25 MG PO TABS: 0.5000 | ORAL_TABLET | Freq: Every day | ORAL | 3 refills | Status: DC

## 2019-02-23 ENCOUNTER — Other Ambulatory Visit: Payer: Self-pay | Admitting: Cardiology

## 2019-02-23 DIAGNOSIS — I10 Essential (primary) hypertension: Secondary | ICD-10-CM

## 2019-02-23 MED ORDER — TRIAMTERENE-HCTZ 37.5-25 MG PO TABS: 0.5000 | ORAL_TABLET | Freq: Every day | ORAL | 2 refills | Status: DC

## 2019-03-06 DIAGNOSIS — I130 Hypertensive heart and chronic kidney disease with heart failure and stage 1 through stage 4 chronic kidney disease, or unspecified chronic kidney disease: Secondary | ICD-10-CM | POA: Insufficient documentation

## 2019-03-08 DIAGNOSIS — H60502 Unspecified acute noninfective otitis externa, left ear: Secondary | ICD-10-CM | POA: Insufficient documentation

## 2019-03-08 DIAGNOSIS — H9041 Sensorineural hearing loss, unilateral, right ear, with unrestricted hearing on the contralateral side: Secondary | ICD-10-CM | POA: Insufficient documentation

## 2019-04-21 NOTE — Assessment & Plan Note (Deleted)
Left lumpectomy 06/16/2016: DCIS high-grade, 1.5 cm, margins clear, ER 95%, PR 80%, Tis Nx stage 0  Recommendation: adjuvant Tamoxifen 20 mg daily5 years  Prognosis: Based on MSKCCnomogram for DCIS 1. No further treatment: 5 year risk 7%, 10 year risk 11% 2. radiation alone: 5 year risk 3%, 10 year risk 4% 3. Antiestrogen therapy alone: 5 year risk 3%, 10 year risk 5% 4. Both radiation and antiestrogen therapy: 5 year risk 1%, 10 year risk 2%  Current treatment: Tamoxifen 20 mg daily (Didnot need XRT) Tamoxifen Toxicities: Hair loss: Instructed her to use coconut oil.  She is already taking biotin supplement.  Breast cancer surveillance: 1. Breast exam: 04/29/2018 benign 2. Mammogram 04/29/17: At [facility] normal  RTC in 1 year

## 2019-04-28 ENCOUNTER — Inpatient Hospital Stay: Payer: Medicare Other | Admitting: Hematology and Oncology

## 2019-05-12 ENCOUNTER — Telehealth: Payer: Self-pay | Admitting: Hematology and Oncology

## 2019-05-12 NOTE — Assessment & Plan Note (Signed)
Left lumpectomy 06/16/2016: DCIS high-grade, 1.5 cm, margins clear, ER 95%, PR 80%, Tis Nx stage 0  Recommendation: adjuvant Tamoxifen 20 mg daily5 years  Prognosis: Based on MSKCCnomogram for DCIS 1. No further treatment: 5 year risk 7%, 10 year risk 11% 2. radiation alone: 5 year risk 3%, 10 year risk 4% 3. Antiestrogen therapy alone: 5 year risk 3%, 10 year risk 5% 4. Both radiation and antiestrogen therapy: 5 year risk 1%, 10 year risk 2%  Current treatment: Tamoxifen 20 mg daily (Didnot need XRT) Tamoxifen Toxicities: Hair loss: Instructed her to use coconut oil.  She is already taking biotin supplement.  Breast cancer surveillance: 1. Breast exam: 04/29/2018 benign 2. Mammogram July 2020: At [facility] benign  RTC in 1 year

## 2019-05-12 NOTE — Telephone Encounter (Signed)
I left a message regarding visit °

## 2019-05-16 NOTE — Progress Notes (Signed)
  HEMATOLOGY-ONCOLOGY TELEPHONE VISIT PROGRESS NOTE  I connected with [name] on 05/17/2019 at  1:45 PM EDT by telephone and verified that I am speaking with the correct person using two identifiers.  I discussed the limitations, risks, security and privacy concerns of performing an evaluation and management service by telephone and the availability of in person appointments.  I also discussed with the patient that there may be a patient responsible charge related to this service. The patient expressed understanding and agreed to proceed.   History of Present Illness: Kathryn Ayala is a 84 y.o. female with above-mentioned history of left breast DCIS who underwent a left lumpectomy and is currently on anti-estrogen therapy with tamoxifen. I last saw her a year ago. She presents over the phone today for annual follow-up.   Oncology History  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[place])  05/02/2016 Mammogram   There are 3.2 cm grouped calcifications in the left breast at 12:00 posterior depth. There is architectural distortion.   05/07/2016 Initial Diagnosis   Left breast biopsy: DCIS with calcifications, high-grade, ER 95%, PR 80%, Tis N0 stage 0   06/16/2016 Surgery   Left lumpectomy: DCIS high-grade, 1.5 cm, margins clear, ER 95%, PR 80%, Tis Nx stage 0   07/28/2016 -  Anti-estrogen oral therapy   Tamoxifen 20 mg daily (Didnot need XRT)     Observations/Objective:  No clinical evidence of disease recurrence   Assessment Plan:  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[place]) Left lumpectomy 06/16/2016: DCIS high-grade, 1.5 cm, margins clear, ER 95%, PR 80%, Tis Nx stage 0  Recommendation: adjuvant Tamoxifen 20 mg daily5 years  Prognosis: Based on MSKCCnomogram for DCIS 1. No further treatment: 5 year risk 7%, 10 year risk 11% 2. radiation alone: 5 year risk 3%, 10 year risk 4% 3. Antiestrogen therapy alone: 5 year  risk 3%, 10 year risk 5% 4. Both radiation and antiestrogen therapy: 5 year risk 1%, 10 year risk 2%  Current treatment: Tamoxifen 20 mg daily (Didnot need XRT) Tamoxifen Toxicities: Hair loss: Instructed her to use coconut oil.  Breast cancer surveillance: 1. Breast exam: 04/29/2018 benign 2. Mammogram July 2019: At [facility] benign, mammogram postponed for July 2020  RTC in 1 year  I discussed the assessment and treatment plan with the patient. The patient was provided an opportunity to ask questions and all were answered. The patient agreed with the plan and demonstrated an understanding of the instructions. The patient was advised to call back or seek an in-person evaluation if the symptoms worsen or if the condition fails to improve as anticipated.   I provided 15 minutes of non-face-to-face time during this encounter.   Rulon Eisenmenger, MD 05/17/2019    I, Molly Dorshimer, am acting as scribe for Nicholas Lose, MD.  I have reviewed the above documentation for accuracy and completeness, and I agree with the above.

## 2019-05-17 ENCOUNTER — Inpatient Hospital Stay: Payer: Medicare Other | Attending: Hematology and Oncology | Admitting: Hematology and Oncology

## 2019-05-17 DIAGNOSIS — I10 Essential (primary) hypertension: Secondary | ICD-10-CM | POA: Diagnosis not present

## 2019-05-17 DIAGNOSIS — C50212 Malignant neoplasm of upper-inner quadrant of left female breast: Secondary | ICD-10-CM

## 2019-05-17 DIAGNOSIS — Z170 Estrogen receptor positive status [ER+]: Secondary | ICD-10-CM | POA: Diagnosis not present

## 2019-05-17 DIAGNOSIS — Z79810 Long term (current) use of selective estrogen receptor modulators (SERMs): Secondary | ICD-10-CM

## 2019-05-17 MED ORDER — TRIAMTERENE-HCTZ 37.5-25 MG PO TABS: 0.5000 | ORAL_TABLET | Freq: Every day | ORAL | 2 refills | Status: DC

## 2019-05-17 MED ORDER — TAMOXIFEN CITRATE 20 MG PO TABS: 20.0000 mg | ORAL_TABLET | Freq: Every day | ORAL | 3 refills | Status: DC

## 2019-05-17 MED ORDER — AMLODIPINE BESYLATE 5 MG PO TABS: 5.0000 mg | ORAL_TABLET | Freq: Every day | ORAL | Status: DC

## 2019-05-23 ENCOUNTER — Other Ambulatory Visit: Payer: Self-pay | Admitting: Cardiology

## 2019-05-23 ENCOUNTER — Encounter: Payer: Self-pay | Admitting: Hematology and Oncology

## 2019-05-31 ENCOUNTER — Encounter: Payer: Self-pay | Admitting: *Deleted

## 2019-05-31 NOTE — Progress Notes (Signed)
[facility] mammogram report received.  Sent to be scanned into epic.

## 2019-08-16 DIAGNOSIS — M170 Bilateral primary osteoarthritis of knee: Secondary | ICD-10-CM | POA: Insufficient documentation

## 2019-08-25 DIAGNOSIS — E559 Vitamin D deficiency, unspecified: Secondary | ICD-10-CM | POA: Insufficient documentation

## 2019-09-08 ENCOUNTER — Other Ambulatory Visit: Payer: Self-pay

## 2019-09-08 ENCOUNTER — Ambulatory Visit: Payer: Medicare Other | Admitting: Cardiology

## 2019-09-08 ENCOUNTER — Encounter: Payer: Self-pay | Admitting: Cardiology

## 2019-09-08 VITALS — BP 126/78 | HR 67 | Temp 97.6°F | Ht 63.0 in | Wt 173.0 lb

## 2019-09-08 DIAGNOSIS — R0600 Dyspnea, unspecified: Secondary | ICD-10-CM | POA: Diagnosis not present

## 2019-09-08 DIAGNOSIS — R0609 Other forms of dyspnea: Secondary | ICD-10-CM

## 2019-09-08 DIAGNOSIS — I10 Essential (primary) hypertension: Secondary | ICD-10-CM | POA: Diagnosis not present

## 2019-09-08 DIAGNOSIS — I4821 Permanent atrial fibrillation: Secondary | ICD-10-CM

## 2019-09-08 NOTE — Progress Notes (Signed)
Primary Physician/Referring:  Crist Infante, MD  Patient ID: Kathryn Ayala, female    DOB: January 14, 1935, 84 y.o.   MRN: [id number]  Chief Complaint  Patient presents with  . Atrial Fibrillation  . Follow-up   HPI:    Kathryn Ayala  is a 84 y.o.  Caucasian female with permanent atrial fibrillation, essential hypertension, hyperlipidemia, mild chronic dyspnea with a negative nuclear stress test in 2018 and echocardiogram had revealed moderate MR and moderate to severe TR with very mild pulmonary hypertension. She is presently on chronic anticoagulation with Eliquis. She is here on a 82-monthoffice visit and follow-up.  No bleeding diathesis on anticoagulation. No dizziness, TIA, syncope. No palpitations, recent weight changes. Her main complaint today is bilateral knee arthritis right leg worse than the left. She does have chronic mild dyspnea that is remained stable. No PND or orthopnea. No leg edema. Denies chest pain. No bleeding diathesis on Eliquis.   Her husband is now diagnosed with cancer, non-Hodgkin's lymphoma and she is the caregiver.  Past Medical History:  Diagnosis Date  . Anemia    after a pregnancy  . Anxiety   . Arthritis   . Atrial fibrillation ([facility]   . Breast cancer, left ([facility]   . Complication of anesthesia    rapid heart beat afterwards sometimes  . Depression   . Hypertension   . Kidney stones   . Pneumonia   . 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 Urinary frequency    Past Surgical History:  Procedure Laterality Date  . ABDOMINAL HYSTERECTOMY  1996  . APPENDECTOMY  1956  . BREAST BIOPSY Left 05/07/2016  . BREAST BIOPSY Bilateral 1956, 1980  . BREAST LUMPECTOMY WITH RADIOACTIVE SEED LOCALIZATION Left 06/16/2016   Procedure: LEFT BREAST LUMPECTOMY WITH RADIOACTIVE SEED LOCALIZATION;  Surgeon: PAutumn MessingIII, MD;  [facility]  Service: General;  Laterality: Left;  . COLONOSCOPY    . KIDNEY STONE SURGERY     . KNEE SURGERY  11/20/2004  . WRIST SURGERY Left    Social History   Socioeconomic History  . Marital status: Married    Spouse name: Not on file  . Number of children: 2  . Years of education: Not on file  . Highest education level: Not on file  Occupational History  . Not on file  Social Needs  . Financial resource strain: Not on file  . Food insecurity    Worry: Not on file    Inability: Not on file  . Transportation needs    Medical: Not on file    Non-medical: Not on file  Tobacco Use  . Smoking status: Never Smoker  . Smokeless tobacco: Never Used  Substance and Sexual Activity  . Alcohol use: No  . Drug use: No  . Sexual activity: Not on file  Lifestyle  . Physical activity    Days per week: Not on file    Minutes per session: Not on file  . Stress: Not on file  Relationships  . Social [place] phone: Not on file    Gets together: Not on file    Attends religious service: Not on file    Active member of club or organization: Not on file    Attends meetings of clubs or organizations: Not on file    Relationship status: Not on file  . Intimate partner violence    Fear  of current or ex partner: Not on file    Emotionally abused: Not on file    Physically abused: Not on file    Forced sexual activity: Not on file  Other Topics Concern  . Not on file  Social History Narrative  . Not on file   ROS  Review of Systems  Constitution: Negative for chills, decreased appetite, malaise/fatigue and weight gain.  Cardiovascular: Positive for dyspnea on exertion (stable). Negative for syncope.  Endocrine: Negative for cold intolerance.  Hematologic/Lymphatic: Does not bruise/bleed easily.  Musculoskeletal: Positive for joint pain (bilateral knee). Negative for joint swelling.  Gastrointestinal: Negative for abdominal pain, anorexia, change in bowel habit, hematochezia and melena.  Neurological: Negative for headaches and light-headedness.   Psychiatric/Behavioral: Negative for depression and substance abuse.  All other systems reviewed and are negative.  Objective   Vitals with BMI 09/08/2019 06/09/2018 04/29/2018  Height '5\' 3"'  - '5\' 3"'   Weight 173 lbs - 175 lbs  BMI 16.10 - 96.04  Systolic 540 981 191  Diastolic 78 57 60  Pulse 67 90 71      Physical Exam  Constitutional:  She is moderately built and well-nourished in no acute distress.  HENT:  Head: Atraumatic.  Eyes: Conjunctivae are normal.  Neck: Neck supple. No JVD present. No thyromegaly present.  Cardiovascular: Intact distal pulses and normal pulses. An irregularly irregular rhythm present. Exam reveals no gallop, no S3 and no S4.  No murmur heard. S1 is variable, S2 is normal.   Pulmonary/Chest: Effort normal and breath sounds normal.  Abdominal: Soft. Bowel sounds are normal.  Musculoskeletal: Normal range of motion.        General: No edema.  Neurological: She is alert.  Skin: Skin is warm and dry.  Psychiatric: She has a normal mood and affect.   Laboratory examination:   Labs 07/26/2018: Hemasure negative, iron studies normal, B12 normal.  Labs 02/17/2018: Serum glucose 87 mg, BUN 21, creatinine 0.74, eGFR greater than 60 ML, potassium 3.8.  HB 12.6/HCT 38.5, platelets 209.  Normal indicis.  Labs 06/17/2017: Cholesterol 152, triglycerides 137, HDL 58, LDL 67.  Non-HDL cholesterol 94.  CMP, CBC normal, TSH normal, vitamin D 29.2.  A1c 6.3%.  No results for input(s): NA, K, CL, CO2, GLUCOSE, BUN, CREATININE, CALCIUM, GFRNONAA, GFRAA in the last 8760 hours. CrCl cannot be calculated (Patient's most recent lab result is older than the maximum 21 days allowed.).  CMP Latest Ref Rng & Units 06/10/2016  Glucose 65 - 99 mg/dL 122(H)  BUN 6 - 20 mg/dL 16  Creatinine 0.44 - 1.00 mg/dL 0.84  Sodium 135 - 145 mmol/L 138  Potassium 3.5 - 5.1 mmol/L 3.7  Chloride 101 - 111 mmol/L 108  CO2 22 - 32 mmol/L 22  Calcium 8.9 - 10.3 mg/dL 9.7   CBC Latest Ref  Rng & Units 06/10/2016  WBC 4.0 - 10.5 K/uL 6.7  Hemoglobin 12.0 - 15.0 g/dL 14.2  Hematocrit 36.0 - 46.0 % 44.9  Platelets 150 - 400 K/uL 175   Lipid Panel  No results found for: CHOL, TRIG, HDL, CHOLHDL, VLDL, LDLCALC, LDLDIRECT HEMOGLOBIN A1C No results found for: HGBA1C, MPG TSH No results for input(s): TSH in the last 8760 hours. Medications and allergies   Allergies  Allergen Reactions  . Penicillins Anaphylaxis, Swelling and Rash    Has patient had a PCN reaction causing immediate rash, facial/tongue/throat swelling, SOB or lightheadedness with hypotension: Yes Has patient had a PCN reaction causing severe rash involving  mucus membranes or skin necrosis: Yes Has patient had a PCN reaction that required hospitalization No Has patient had a PCN reaction occurring within the last 10 years: No If all of the above answers are "NO", then may proceed with Cephalosporin use.   . Codeine Other (See Comments)    UNSPECIFIED REACTION Patient was told not to take it.  . Levaquin [Levofloxacin] Other (See Comments)    UNSPECIFIED REACTION Patient was told not to take it.  Marland Kitchen Sulfur Other (See Comments)    unknown     Current Outpatient Medications  Medication Instructions  . allopurinol (ZYLOPRIM) 100 mg, Oral, 2 times daily  . amLODipine (NORVASC) 5 mg, Oral, Daily  . atenolol (TENORMIN) 50 mg, Oral, BH-each morning  . ELIQUIS 5 MG TABS tablet TAKE 1 TABLET BY MOUTH TWICE A DAY  . predniSONE (DELTASONE) 5 mg, Oral, Daily with breakfast, Polymyalgia rheumatica   . tamoxifen (NOLVADEX) 20 mg, Oral, Daily  . triamterene-hydrochlorothiazide (MAXZIDE-25) 37.5-25 MG tablet 0.5 tablets, Oral, Daily  . venlafaxine XR (EFFEXOR-XR) 75 MG 24 hr capsule Oral, Daily    Radiology:  No results found. Cardiac Studies:   Echocardiogram 12/16/2016: Left ventricle cavity is normal in size. Normal global wall motion. Unable to evaluate diastolic function due to A. Fibrillation. Calculated EF  69%. Left atrial cavity is severely dilated. LA measures 5.2 cm. Right atrial cavity is severely dilated. Mild to moderate aortic regurgitation. Moderate (Grade II) mitral regurgitation. Moderate to severe tricuspid regurgitation. Mild pulmonary hypertension. Pulmonary artery systolic pressure is estimated at 31 mm Hg. Echo 12/15/11: Normal LVEF.   Mild LVH.   Mild to mod MR, borderline prolapse. Mod TR, mild to mod Pulmonary hypertension. LA size 4.7 cm.  Lexiscan myoview stress test 12/12/2016: 1. The resting electrocardiogram demonstrated atrial fibrillation, normal resting conduction and nonspecific ST-T changes, cannot exclude inferolateral ischemia.  Stress EKG was nondiagnostic for ischemia as it's a pharmacologic stress test.  Stress symptoms included dyspnea. 2. Myocardial perfusion imaging is normal. Overall left ventricular systolic function was normal without regional wall motion abnormalities. The left ventricular ejection fraction was 73%. No significant change from [place] stress 12/19/11.   Assessment     ICD-10-CM   1. Permanent atrial fibrillation (HCC)  I48.21 EKG 12-Lead   CHA2DS2-VASc Score is 4 with yearly risk of stroke of 4.0%.   2. Dyspnea on exertion  R06.00   3. Essential hypertension  I10     EKG 09/08/2019: Atrial fibrillation with controlled ventricular response at the rate of 72 bpm, normal axis.  Poor R-wave progression, cannot exclude anteroseptal infarct old.  Low voltage complexes.  Nonspecific T abnormality. No significant change from  EKG 09/06/2018.  Recommendations:  No orders of the defined types were placed in this encounter.   Kathryn Ayala  is a 84 y.o.   Caucasian female with permanent atrial fibrillation, essential hypertension, hyperlipidemia, mild chronic dyspnea with a negative nuclear stress test in 2018 and echocardiogram had revealed moderate MR and moderate to severe TR with very mild pulmonary hypertension.   She is presently on  chronic anticoagulation with Eliquis.She is here on a 39-monthoffice visit and follow-up. No change in exam and no clinical e/o CHF or EKG changes.   She states her blood counts and lipids are normal per Dr. PJoylene Draftand she has not had any bleeding problems. Dyspnea is stable and no clinical e/o CHF. Continue present meds, OV in 1 year. HR is controlled on present meds.  Will  request PCP labs.  Adrian Prows, MD, [facility] 09/08/2019, 10:28 PM [facility] [place] Pager: [phone] Office: [phone] If no answer Cell [phone]

## 2019-09-09 DIAGNOSIS — I272 Pulmonary hypertension, unspecified: Secondary | ICD-10-CM | POA: Insufficient documentation

## 2019-09-12 ENCOUNTER — Other Ambulatory Visit: Payer: Self-pay

## 2019-09-12 DIAGNOSIS — I10 Essential (primary) hypertension: Secondary | ICD-10-CM

## 2019-09-12 MED ORDER — AMLODIPINE BESYLATE 5 MG PO TABS: 5.0000 mg | ORAL_TABLET | Freq: Every day | ORAL | Status: DC

## 2019-11-12 ENCOUNTER — Other Ambulatory Visit: Payer: Self-pay | Admitting: Cardiology

## 2019-12-15 DIAGNOSIS — D6859 Other primary thrombophilia: Secondary | ICD-10-CM | POA: Insufficient documentation

## 2020-03-26 ENCOUNTER — Other Ambulatory Visit: Payer: Self-pay | Admitting: Cardiology

## 2020-03-26 DIAGNOSIS — I10 Essential (primary) hypertension: Secondary | ICD-10-CM

## 2020-05-15 NOTE — Progress Notes (Signed)
Patient Care Team: Crist Infante, MD as PCP - General (Internal Medicine) Nicholas Lose, MD as Consulting Physician (Hematology and Oncology) Delice Bison, Charlestine Massed, NP as Nurse Practitioner (Hematology and Oncology) Jovita Kussmaul, MD as Consulting Physician (General Surgery)  DIAGNOSIS:    ICD-10-CM   1. Malignant neoplasm of upper-inner quadrant of left breast in female, estrogen receptor positive ([place])  C50.212    Z17.0     SUMMARY OF ONCOLOGIC HISTORY: Oncology History  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[place])  05/02/2016 Mammogram   There are 3.2 cm grouped calcifications in the left breast at 12:00 posterior depth. There is architectural distortion.   05/07/2016 Initial Diagnosis   Left breast biopsy: DCIS with calcifications, high-grade, ER 95%, PR 80%, Tis N0 stage 0   06/16/2016 Surgery   Left lumpectomy: DCIS high-grade, 1.5 cm, margins clear, ER 95%, PR 80%, Tis Nx stage 0   07/28/2016 -  Anti-estrogen oral therapy   Tamoxifen 20 mg daily (Didnot need XRT)     CHIEF COMPLIANT: Follow-up of left breast DCIS on tamoxifen  INTERVAL HISTORY: Kathryn Ayala is a 85 y.o. with above-mentioned history of left breast DCIS who underwent a left lumpectomy and is currently on anti-estrogen therapy with tamoxifen. Mammogram on 05/23/19 showed no evidence of malignancy bilaterally. She presents to the clinic today for annual follow-up.   She has profound hot flashes especially when she goes out in the sun or in the heat. She is the primary caregiver to her husband who is having difficulty with walking and uses a walker and he falls regularly.  ALLERGIES:  is allergic to penicillins, codeine, levaquin [levofloxacin], and sulfur.  MEDICATIONS:  Current Outpatient Medications  Medication Sig Dispense Refill  . allopurinol (ZYLOPRIM) 100 MG tablet Take 100 mg by mouth 2 (two) times daily.   3  . amLODipine (NORVASC) 5 MG tablet Take 1 tablet (5 mg  total) by mouth daily.    Marland Kitchen atenolol (TENORMIN) 50 MG tablet Take 50 mg by mouth every morning.   2  . ELIQUIS 5 MG TABS tablet TAKE 1 TABLET BY MOUTH TWICE A DAY 180 tablet 1  . predniSONE (DELTASONE) 5 MG tablet Take 5 mg by mouth daily with breakfast. Polymyalgia rheumatica    . tamoxifen (NOLVADEX) 20 MG tablet Take 1 tablet (20 mg total) by mouth daily. 90 tablet 3  . triamterene-hydrochlorothiazide (MAXZIDE-25) 37.5-25 MG tablet Take 1 tablet by mouth daily. 90 tablet 2  . venlafaxine XR (EFFEXOR-XR) 75 MG 24 hr capsule Take by mouth daily.  11   No current facility-administered medications for this visit.    PHYSICAL EXAMINATION: ECOG PERFORMANCE STATUS: 1 - Symptomatic but completely ambulatory  Vitals:   05/16/20 1003  BP: (!) 132/73  Pulse: 84  Resp: 18  Temp: 98.5 F (36.9 C)  SpO2: 100%   Filed Weights   05/16/20 1003  Weight: 178 lb 8 oz (81 kg)    BREAST: No palpable masses or nodules in either right or left breasts. No palpable axillary supraclavicular or infraclavicular adenopathy no breast tenderness or nipple discharge. (exam performed in the presence of a chaperone)  LABORATORY DATA:  I have reviewed the data as listed CMP Latest Ref Rng & Units 06/10/2016  Glucose 65 - 99 mg/dL 122(H)  BUN 6 - 20 mg/dL 16  Creatinine 0.44 - 1.00 mg/dL 0.84  Sodium 135 - 145 mmol/L 138  Potassium 3.5 - 5.1 mmol/L 3.7  Chloride 101 -  111 mmol/L 108  CO2 22 - 32 mmol/L 22  Calcium 8.9 - 10.3 mg/dL 9.7    Lab Results  Component Value Date   WBC 6.7 06/10/2016   HGB 14.2 06/10/2016   HCT 44.9 06/10/2016   MCV 93.9 06/10/2016   PLT 175 06/10/2016    ASSESSMENT & PLAN:  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[place]) Left lumpectomy 06/16/2016: DCIS high-grade, 1.5 cm, margins clear, ER 95%, PR 80%, Tis Nx stage 0  Current treatment: Tamoxifen 20 mg daily (Didnot need XRT) reduce to 10 mg daily 05/16/2020 Tamoxifen Toxicities: Feeling of hot  flashes especially when she steps out in the sun: I instructed her to take half a tablet daily. Hair loss: Instructed her to use coconut oil.  Breast cancer surveillance: 1. Breast exam:May 16, 2020 benign 2. Mammogram  to be done at [facility] [facility] on May 23, 2019  Patient is a primary caregiver to her husband who has difficulty with walking. She has poor knees and would like to get her knees done but she is unable to do so until her husband gets better.  RTC in 1 year and after that she can be followed by her primary care physician.    No orders of the defined types were placed in this encounter.  The patient has a good understanding of the overall plan. she agrees with it. she will call with any problems that may develop before the next visit here.  Total time spent: 20 mins including face to face time and time spent for planning, charting and coordination of care  Nicholas Lose, MD 05/16/2020  I, Cloyde Reams Dorshimer, am acting as scribe for Dr. Nicholas Lose.  I have reviewed the above documentation for accuracy and completeness, and I agree with the above.

## 2020-05-16 ENCOUNTER — Inpatient Hospital Stay: Payer: Medicare PPO | Attending: Hematology and Oncology | Admitting: Hematology and Oncology

## 2020-05-16 ENCOUNTER — Other Ambulatory Visit: Payer: Self-pay

## 2020-05-16 DIAGNOSIS — C50212 Malignant neoplasm of upper-inner quadrant of left female breast: Secondary | ICD-10-CM | POA: Insufficient documentation

## 2020-05-16 DIAGNOSIS — Z170 Estrogen receptor positive status [ER+]: Secondary | ICD-10-CM

## 2020-05-16 DIAGNOSIS — Z7952 Long term (current) use of systemic steroids: Secondary | ICD-10-CM | POA: Insufficient documentation

## 2020-05-16 DIAGNOSIS — Z79810 Long term (current) use of selective estrogen receptor modulators (SERMs): Secondary | ICD-10-CM | POA: Insufficient documentation

## 2020-05-16 DIAGNOSIS — Z7901 Long term (current) use of anticoagulants: Secondary | ICD-10-CM | POA: Insufficient documentation

## 2020-05-16 DIAGNOSIS — N951 Menopausal and female climacteric states: Secondary | ICD-10-CM | POA: Insufficient documentation

## 2020-05-16 DIAGNOSIS — Z79899 Other long term (current) drug therapy: Secondary | ICD-10-CM | POA: Insufficient documentation

## 2020-05-16 MED ORDER — TAMOXIFEN CITRATE 20 MG PO TABS: 10.0000 mg | ORAL_TABLET | Freq: Every day | ORAL | 1 refills | Status: DC

## 2020-05-16 NOTE — Assessment & Plan Note (Signed)
Left lumpectomy 06/16/2016: DCIS high-grade, 1.5 cm, margins clear, ER 95%, PR 80%, Tis Nx stage 0  Current treatment: Tamoxifen 20 mg daily (Didnot need XRT) Tamoxifen Toxicities: Hair loss: Instructed her to use coconut oil.  Breast cancer surveillance: 1. Breast exam:May 16, 2020 benign 2. Mammogram  to be done at [facility] on May 23, 2019  RTC in 1 year

## 2020-07-09 ENCOUNTER — Other Ambulatory Visit: Payer: Self-pay | Admitting: Hematology and Oncology

## 2020-09-06 ENCOUNTER — Ambulatory Visit: Payer: Medicare Other | Admitting: Cardiology

## 2020-09-26 ENCOUNTER — Encounter: Payer: Self-pay | Admitting: Cardiology

## 2020-09-26 ENCOUNTER — Ambulatory Visit: Payer: Medicare PPO | Admitting: Cardiology

## 2020-09-26 ENCOUNTER — Other Ambulatory Visit: Payer: Self-pay

## 2020-09-26 VITALS — BP 140/79 | HR 96 | Resp 16 | Ht 63.0 in | Wt 178.4 lb

## 2020-09-26 DIAGNOSIS — I4821 Permanent atrial fibrillation: Secondary | ICD-10-CM

## 2020-09-26 DIAGNOSIS — I351 Nonrheumatic aortic (valve) insufficiency: Secondary | ICD-10-CM

## 2020-09-26 DIAGNOSIS — I071 Rheumatic tricuspid insufficiency: Secondary | ICD-10-CM

## 2020-09-26 DIAGNOSIS — R0600 Dyspnea, unspecified: Secondary | ICD-10-CM

## 2020-09-26 DIAGNOSIS — I10 Essential (primary) hypertension: Secondary | ICD-10-CM

## 2020-09-26 DIAGNOSIS — R0609 Other forms of dyspnea: Secondary | ICD-10-CM

## 2020-09-26 NOTE — Progress Notes (Signed)
Primary Physician/Referring:  Crist Infante, MD  Patient ID: Kathryn Ayala, female    DOB: Dec 09, 1934, 85 y.o.   MRN: [id number]  Chief Complaint  Patient presents with  . Atrial Fibrillation  . Shortness of Breath    1 year follwo up   HPI:    Kathryn Ayala  is a 85 y.o.  Caucasian female with permanent atrial fibrillation, essential hypertension, hyperlipidemia, mild chronic dyspnea with a negative nuclear stress test in 2018 and echocardiogram had revealed moderate MR and moderate to severe TR with very mild pulmonary hypertension. She is presently on chronic anticoagulation with Eliquis. She is here on a 78-monthoffice visit and follow-up.  No bleeding diathesis on anticoagulation. No dizziness, TIA, syncope. No palpitations, recent weight changes. Her main complaint today is bilateral knee arthritis right leg worse than the left. She does have chronic mild dyspnea that is remained stable. No PND or orthopnea. No leg edema. Denies chest pain. No bleeding diathesis on Eliquis.   Her husband is now diagnosed with cancer, non-Hodgkin's lymphoma and she is the caregiver. Hence she is aware that she is unable to get knee replacement done. She has also had 2 falls, both accidental and has hurt her back and left side of her hip. Walking has been difficult.  Past Medical History:  Diagnosis Date  . Anemia    after a pregnancy  . Anxiety   . Arthritis   . [place]   . [place] [place]   . Complication of anesthesia    rapid heart beat afterwards sometimes  . Depression   . Hypertension   . Kidney stones   . Pneumonia   . 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 Urinary frequency    Past Surgical History:  Procedure Laterality Date  . ABDOMINAL HYSTERECTOMY  1996  . APPENDECTOMY  1956  . BREAST BIOPSY Left 05/07/2016  . BREAST BIOPSY Bilateral 1956, 1980  . BREAST LUMPECTOMY WITH RADIOACTIVE SEED  LOCALIZATION Left 06/16/2016   Procedure: LEFT BREAST LUMPECTOMY WITH RADIOACTIVE SEED LOCALIZATION;  Surgeon: PAutumn MessingIII, MD;  [facility] [facility]  Service: General;  Laterality: Left;  . COLONOSCOPY    . KIDNEY STONE SURGERY    . KNEE SURGERY  11/20/2004  . WRIST SURGERY Left    Social History   Tobacco Use  . Smoking status: Never Smoker  . Smokeless tobacco: Never Used  Substance Use Topics  . Alcohol use: No  Marital Status: Married    ROS  Review of Systems  Constitutional: Negative for chills, decreased appetite, malaise/fatigue and weight gain.  Cardiovascular: Positive for dyspnea on exertion (stable) and palpitations (occasional). Negative for syncope.  Endocrine: Negative for cold intolerance.  Hematologic/Lymphatic: Does not bruise/bleed easily.  Musculoskeletal: Positive for arthritis, back pain and joint pain (bilateral knee). Negative for joint swelling.  Gastrointestinal: Negative for abdominal pain, anorexia, change in bowel habit, hematochezia and melena.  Neurological: Negative for headaches and light-headedness.  Psychiatric/Behavioral: Negative for depression and substance abuse.  All other systems reviewed and are negative.  Objective   Vitals with BMI 09/26/2020 05/16/2020 09/08/2019  Height _0  _1  _2   Weight 178 lbs 6 oz 178 lbs 8 oz 173 lbs  BMI 31.61 [phone] Systolic [id number] Diastolic 79 73 78  Pulse 96 84 67      Physical Exam Constitutional:  Comments: She is moderately built and well-nourished in no acute distress.  HENT:     Head: Atraumatic.  Eyes:     Conjunctiva/sclera: Conjunctivae normal.  Neck:     Thyroid: No thyromegaly.     Vascular: No JVD.  Cardiovascular:     Rate and Rhythm: Rhythm irregularly irregular.     Pulses: Normal pulses and intact distal pulses.     Heart sounds: Murmur heard. High-pitched early systolic murmur is present with a grade of 2/6 at the upper right sternal border and  apex. High-pitched blowing early diastolic murmur is present with a grade of 2/4 at the upper right sternal border.  No gallop. No S3 or S4 sounds.      Comments: S1 is variable, S2 is normal.  Pulmonary:     Effort: Pulmonary effort is normal.     Breath sounds: Normal breath sounds.  Abdominal:     General: Bowel sounds are normal.     Palpations: Abdomen is soft.  Musculoskeletal:        General: Normal range of motion.     Cervical back: Neck supple.  Skin:    General: Skin is warm and dry.  Neurological:     Mental Status: She is alert.    Laboratory examination:   External labs:   Labs 07/26/2018: Hemasure negative, iron studies normal, B12 normal.  Labs 02/17/2018: Serum glucose 87 mg, BUN 21, creatinine 0.74, eGFR greater than 60 ML, potassium 3.8.  HB 12.6/HCT 38.5, platelets 209.  Normal indicis.  Labs 06/17/2017: Cholesterol 152, triglycerides 137, HDL 58, LDL 67.  Non-HDL cholesterol 94.  CMP, CBC normal, TSH normal, vitamin D 29.2.  A1c 6.3%.  Medications and allergies   Allergies  Allergen Reactions  . Penicillins Anaphylaxis, Swelling and Rash    Has patient had a PCN reaction causing immediate rash, facial/tongue/throat swelling, SOB or lightheadedness with hypotension: Yes Has patient had a PCN reaction causing severe rash involving mucus membranes or skin necrosis: Yes Has patient had a PCN reaction that required hospitalization No Has patient had a PCN reaction occurring within the last 10 years: No If all of the above answers are "NO", then may proceed with Cephalosporin use.   . Codeine Other (See Comments)    UNSPECIFIED REACTION Patient was told not to take it.  . Levaquin [Levofloxacin] Other (See Comments)    UNSPECIFIED REACTION Patient was told not to take it.  Marland Kitchen Sulfur Other (See Comments)    unknown    Current Outpatient Medications on File Prior to Visit  Medication Sig Dispense Refill  . allopurinol (ZYLOPRIM) 100 MG tablet Take 100 mg  by mouth 2 (two) times daily.   3  . atenolol (TENORMIN) 50 MG tablet Take 50 mg by mouth every morning.   2  . Biotin 1 MG CAPS Take by mouth.    . Cholecalciferol (VITAMIN D3) 50 MCG (2000 UT) CAPS Take 2,000 Units by mouth daily.    Marland Kitchen ELIQUIS 5 MG TABS tablet TAKE 1 TABLET BY MOUTH TWICE A DAY 180 tablet 1  . predniSONE (DELTASONE) 5 MG tablet Take 5 mg by mouth daily with breakfast. Polymyalgia rheumatica    . rosuvastatin (CRESTOR) 10 MG tablet Take 10 mg by mouth at bedtime.    . tamoxifen (NOLVADEX) 20 MG tablet TAKE 1 TABLET BY MOUTH EVERY DAY 90 tablet 3  . triamterene-hydrochlorothiazide (MAXZIDE-25) 37.5-25 MG tablet Take 1 tablet by mouth daily. 90 tablet 2  . venlafaxine XR (  EFFEXOR-XR) 75 MG 24 hr capsule Take by mouth daily.  11  . vitamin E 180 MG (400 UNITS) capsule Take 400 Units by mouth daily.     No current facility-administered medications on file prior to visit.     Radiology:  No results found. Cardiac Studies:   Echocardiogram 12/16/2016: Left ventricle cavity is normal in size. Normal global wall motion. Unable to evaluate diastolic function due to A. Fibrillation. Calculated EF 69%. Left atrial cavity is severely dilated. LA measures 5.2 cm. Right atrial cavity is severely dilated. Mild to moderate aortic regurgitation. Moderate (Grade II) mitral regurgitation. Moderate to severe tricuspid regurgitation. Mild pulmonary hypertension. Pulmonary artery systolic pressure is estimated at 31 mm Hg. Echo 12/15/11: Normal LVEF.   Mild LVH.   Mild to mod MR, borderline prolapse. Mod TR, mild to mod Pulmonary hypertension. LA size 4.7 cm.  Lexiscan myoview stress test 12/12/2016: 1. The resting electrocardiogram demonstrated atrial fibrillation, normal resting conduction and nonspecific ST-T changes, cannot exclude inferolateral ischemia.  Stress EKG was nondiagnostic for ischemia as it's a pharmacologic stress test.  Stress symptoms included dyspnea. 2. Myocardial  perfusion imaging is normal. Overall left ventricular systolic function was normal without regional wall motion abnormalities. The left ventricular ejection fraction was 73%. No significant change from [place] stress 12/19/11.  EKG:   EKG 09/26/2020: Atrial fibrillation with controlled ventricular response at the rate of 82 bpm, normal axis.  Poor R wave progression, probably normal variant but cannot exclude anteroseptal infarct old.  Nonspecific T abnormality.  Borderline low voltage complexes.  No significant change from 09/08/2019.  Assessment     ICD-10-CM   1. Permanent atrial fibrillation (HCC)  I48.21 EKG 12-Lead    PCV ECHOCARDIOGRAM COMPLETE  2. Essential hypertension  I10   3. Dyspnea on exertion  R06.00   4. Moderate aortic regurgitation  I35.1 PCV ECHOCARDIOGRAM COMPLETE  5. Moderate tricuspid regurgitation  I07.1 PCV ECHOCARDIOGRAM COMPLETE   CHA2DS2-VASc Score is 4.  Yearly risk of stroke: 4.8% (A, F, HTN).  Score of 1=0.6; 2=2.2; 3=3.2; 4=4.8; 5=7.2; 6=9.8; 7=>9.8) -(CHF; HTN; vasc disease DM,  Female = 1; Age <65 =0; 65-74 = 1,  >75 =2; stroke/embolism= 2).    No orders of the defined types were placed in this encounter.  There are no discontinued medications.  Recommendations:   Gearldine Looney  is a 85 y.o.   Caucasian female with permanent atrial fibrillation, essential hypertension, hyperlipidemia, mild chronic dyspnea with a negative nuclear stress test in 2018 and echocardiogram had revealed moderate MR and moderate to severe TR with very mild pulmonary hypertension.   She is presently on chronic anticoagulation with Eliquis.She is here on a 33-monthoffice visit and follow-up. No change in exam and no clinical e/o CHF or EKG changes.   She states her blood counts and lipids are normal per Dr. PJoylene Draftand she has not had any bleeding problems. I could not obtain her recent labs. Dyspnea is stable however she is markedly limited due to arthritis. I would like to  repeat echocardiogram to follow-up on her valvular heart disease and dyspnea.  Her main issue have been degenerative joint disease but as she is the primary caregiver for her husband she is unable to have surgery. She has also had 2 accidental fall and has severe left hip pain. There was no obvious hip fracture on my exam. Unless echocardiogram is markedly abnormal from previous, I'll see her back on annual basis. She'll need continued surveillance of  renal function and CBC as she is on anticoagulation.   Adrian Prows, MD, [facility] 09/26/2020, 4:29 PM Office: [phone] Pager: [phone]

## 2020-10-17 ENCOUNTER — Ambulatory Visit: Payer: Medicare Other | Admitting: Cardiology

## 2020-10-30 ENCOUNTER — Ambulatory Visit: Payer: Medicare PPO

## 2020-10-30 ENCOUNTER — Other Ambulatory Visit: Payer: Self-pay

## 2020-10-30 DIAGNOSIS — I071 Rheumatic tricuspid insufficiency: Secondary | ICD-10-CM

## 2020-10-30 DIAGNOSIS — I351 Nonrheumatic aortic (valve) insufficiency: Secondary | ICD-10-CM

## 2020-10-30 DIAGNOSIS — I4821 Permanent atrial fibrillation: Secondary | ICD-10-CM

## 2020-11-09 NOTE — Progress Notes (Signed)
I would like to change your appointment to 6 months instead of 1 year

## 2020-11-13 ENCOUNTER — Telehealth: Payer: Self-pay

## 2020-11-13 NOTE — Telephone Encounter (Signed)
Patient is aware of echo result

## 2020-12-14 ENCOUNTER — Other Ambulatory Visit: Payer: Self-pay | Admitting: Cardiology

## 2020-12-14 DIAGNOSIS — I10 Essential (primary) hypertension: Secondary | ICD-10-CM

## 2021-03-20 ENCOUNTER — Telehealth: Payer: Self-pay | Admitting: Hematology and Oncology

## 2021-03-20 NOTE — Telephone Encounter (Signed)
R/s per prov pal, per 7/27 los, pt aware

## 2021-04-19 ENCOUNTER — Encounter: Payer: Self-pay | Admitting: Cardiology

## 2021-04-19 ENCOUNTER — Other Ambulatory Visit: Payer: Self-pay

## 2021-04-19 ENCOUNTER — Ambulatory Visit: Payer: Medicare PPO | Admitting: Cardiology

## 2021-04-19 VITALS — BP 123/74 | HR 83 | Temp 96.3°F | Resp 17 | Ht 63.0 in | Wt 175.0 lb

## 2021-04-19 DIAGNOSIS — I340 Nonrheumatic mitral (valve) insufficiency: Secondary | ICD-10-CM

## 2021-04-19 DIAGNOSIS — I10 Essential (primary) hypertension: Secondary | ICD-10-CM

## 2021-04-19 DIAGNOSIS — I4821 Permanent atrial fibrillation: Secondary | ICD-10-CM

## 2021-04-19 DIAGNOSIS — I071 Rheumatic tricuspid insufficiency: Secondary | ICD-10-CM

## 2021-04-19 NOTE — Progress Notes (Signed)
Primary Physician/Referring:  Crist Infante, MD  Patient ID: Kathryn Ayala, female    DOB: 1935-08-22, 86 y.o.   MRN: [id number]  Chief Complaint  Patient presents with   Follow-up    6 month   Valvular heart disease   Atrial Fibrillation   HPI:    Kathryn Ayala  is a 86 y.o.  Caucasian female with permanent atrial fibrillation, essential hypertension, hyperlipidemia, mild chronic dyspnea with a negative nuclear stress test in 2018, moderate to moderately severe MR and TR with mild pulmonary hypertension presents here for 84-monthoffice visit.  She has continued to remain active and denies any dyspnea or chest pain or palpitations. No bleeding diathesis on anticoagulation. No dizziness, TIA, syncope.    Her main complaint today is bilateral knee arthritis and plans to have surgery soon for knee replacement which she has been putting off due to illnesses and her husband.  Her husband is now diagnosed with cancer, non-Hodgkin's lymphoma and she is the caregiver.    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   Urinary frequency    Past Surgical History:  Procedure Laterality Date   AArchbaldLeft 05/07/2016   BREAST BIOPSY Bilateral 1956, 1980   BREAST LUMPECTOMY WITH RADIOACTIVE SEED LOCALIZATION Left 06/16/2016   Procedure: LEFT BREAST LUMPECTOMY WITH RADIOACTIVE SEED LOCALIZATION;  Surgeon: PAutumn MessingIII, MD;  [place] [facility]  Service: General;  Laterality: Left;   COLONOSCOPY     KIDNEY STONE SURGERY     KNEE SURGERY  11/20/2004   WRIST SURGERY Left    Social History   Tobacco Use   Smoking  status: Never   Smokeless tobacco: Never  Substance Use Topics   Alcohol use: No  Marital Status: Married    ROS  Review of Systems  Constitutional: Negative for chills, decreased appetite, malaise/fatigue and weight gain.  Cardiovascular:  Positive for dyspnea on exertion (stable) and palpitations (occasional). Negative for syncope.  Endocrine: Negative for cold intolerance.  Hematologic/Lymphatic: Does not bruise/bleed easily.  Musculoskeletal:  Positive for arthritis, back pain and joint pain (bilateral knee). Negative for joint swelling.  Gastrointestinal:  Negative for abdominal pain, anorexia, change in bowel habit, hematochezia and melena.  Neurological:  Negative for headaches and light-headedness.  Psychiatric/Behavioral:  Negative for depression and substance abuse.   All other systems reviewed and are negative. Objective   Vitals with BMI 04/19/2021 09/26/2020 05/16/2020  Height _0  _1  _2   Weight 175 lbs 178 lbs 6 oz 178 lbs 8 oz  BMI 31.01 [phone] Systolic [id number] Diastolic 74 79 73  Pulse 83 96 84      Physical Exam Constitutional:      Appearance: She is obese.     Comments: She is moderately built and well-nourished in no acute distress.  HENT:     Head: Atraumatic.  Eyes:     Conjunctiva/sclera: Conjunctivae normal.  Neck:     Thyroid: No  thyromegaly.     Vascular: No carotid bruit or JVD.  Cardiovascular:     Rate and Rhythm: Normal rate. Rhythm irregularly irregular.     Pulses: Normal pulses and intact distal pulses.     Heart sounds: S1 normal and S2 normal. Murmur heard.  High-pitched early systolic murmur is present with a grade of 2/6 at the upper right sternal border and apex.  High-pitched blowing early diastolic murmur is present with a grade of 2/4 at the upper right sternal border.    No gallop. No S3 or S4 sounds.     Comments:   Pulmonary:     Effort: Pulmonary effort is normal.     Breath sounds: Normal breath sounds.   Abdominal:     General: Bowel sounds are normal.     Palpations: Abdomen is soft.  Musculoskeletal:        General: No swelling.     Cervical back: Neck supple.  Skin:    General: Skin is warm and dry.     Capillary Refill: Capillary refill takes less than 2 seconds.  Neurological:     General: No focal deficit present.     Mental Status: She is alert.   Laboratory examination:   External labs:  Labs 04/09/2021:  Hb 14.3/HCT 44.8, platelets 193.  Serum creatinine 1.2, EGFR 43 mL.  Labs 07/26/2018: Hemasure negative, iron studies normal, B12 normal.  Labs 02/17/2018: Serum glucose 87 mg, BUN 21, creatinine 0.74, eGFR greater than 60 ML, potassium 3.8.  HB 12.6/HCT 38.5, platelets 209.  Normal indicis.  Labs 06/17/2017: Cholesterol 152, triglycerides 137, HDL 58, LDL 67.  Non-HDL cholesterol 94.  CMP, CBC normal, TSH normal, vitamin D 29.2.  A1c 6.3%.  Medications and allergies   Allergies  Allergen Reactions   Penicillins Anaphylaxis, Swelling and Rash    Has patient had a PCN reaction causing immediate rash, facial/tongue/throat swelling, SOB or lightheadedness with hypotension: Yes Has patient had a PCN reaction causing severe rash involving mucus membranes or skin necrosis: Yes Has patient had a PCN reaction that required hospitalization No Has patient had a PCN reaction occurring within the last 10 years: No If all of the above answers are "NO", then may proceed with Cephalosporin use.    Codeine Other (See Comments)    UNSPECIFIED REACTION Patient was told not to take it.   Elemental Sulfur Other (See Comments)    unknown   Levaquin [Levofloxacin] Other (See Comments)    UNSPECIFIED REACTION Patient was told not to take it.    Current Outpatient Medications on File Prior to Visit  Medication Sig Dispense Refill   allopurinol (ZYLOPRIM) 100 MG tablet Take 100 mg by mouth 2 (two) times daily.   3   atenolol (TENORMIN) 50 MG tablet Take 50 mg by mouth every morning.    2   Biotin 1 MG CAPS Take by mouth.     Cholecalciferol (VITAMIN D3) 50 MCG (2000 UT) CAPS Take 2,000 Units by mouth daily.     diclofenac Sodium (VOLTAREN) 1 % GEL Apply 1 application topically daily.     ELIQUIS 5 MG TABS tablet TAKE 1 TABLET BY MOUTH TWICE A DAY 180 tablet 1   furosemide (LASIX) 20 MG tablet Take 1 tablet by mouth as needed.     predniSONE (DELTASONE) 5 MG tablet Take 5 mg by mouth daily with breakfast. Polymyalgia rheumatica     rosuvastatin (CRESTOR) 10 MG tablet Take 10 mg by mouth at bedtime.  tamoxifen (NOLVADEX) 20 MG tablet TAKE 1 TABLET BY MOUTH EVERY DAY 90 tablet 3   triamterene-hydrochlorothiazide (MAXZIDE-25) 37.5-25 MG tablet TAKE 1 TABLET BY MOUTH EVERY DAY 90 tablet 2   venlafaxine XR (EFFEXOR-XR) 75 MG 24 hr capsule Take by mouth daily.  11   vitamin E 180 MG (400 UNITS) capsule Take 400 Units by mouth daily.     No current facility-administered medications on file prior to visit.    Radiology:  No results found. Cardiac Studies:   Lexiscan myoview stress test 12/12/2016: 1. The resting electrocardiogram demonstrated atrial fibrillation, normal resting conduction and nonspecific ST-T changes, cannot exclude inferolateral ischemia.  Stress EKG was nondiagnostic for ischemia as it's a pharmacologic stress test.  Stress symptoms included dyspnea. 2. Myocardial perfusion imaging is normal. Overall left ventricular systolic function was normal without regional wall motion abnormalities. The left ventricular ejection fraction was 73%. No significant change from [place] stress 12/19/11.  Echocardiogram 10/30/2020: Left ventricle cavity is normal in size and wall thickness. Normal LV systolic function with EF 64%. Normal global wall motion. Unable to evaluate diastolic function due to atrial fibrillation. Left atrial cavity is severely dilated. Right atrial cavity is mildly dilated. Structurally normal trileaflet aortic valve. No evidence of aortic stenosis.  Mild (Grade I) aortic regurgitation. Moderate to severe mitral regurgitation. Moderate to severe tricuspid regurgitation. Estimated pulmonary artery systolic pressure 25 mmHg. No significant change compared to previous study in 2018, MR is slightly worse.   EKG:  EKG 04/19/2021: Atrial fibrillation with controlled ventricular response at the rate of 73 bpm, normal axis, poor R progression, probably normal variant.  Nonspecific T abnormality.  No change from 09/26/2020.  Assessment     ICD-10-CM   1. Moderate to severe mitral regurgitation  I34.0 PCV ECHOCARDIOGRAM COMPLETE    2. Moderate tricuspid regurgitation  I07.1 PCV ECHOCARDIOGRAM COMPLETE    3. Permanent atrial fibrillation (HCC)  I48.21 EKG 12-Lead    4. Essential hypertension  I10      CHA2DS2-VASc Score is 4.  Yearly risk of stroke: 4.8% (A, F, HTN).  Score of 1=0.6; 2=2.2; 3=3.2; 4=4.8; 5=7.2; 6=9.8; 7=>9.8) -(CHF; HTN; vasc disease DM,  Female = 1; Age <65 =0; 65-74 = 1,  >75 =2; stroke/embolism= 2).    No orders of the defined types were placed in this encounter.  There are no discontinued medications.  Recommendations:   Kathryn Ayala  is a 86 y.o.   Caucasian female with permanent atrial fibrillation, essential hypertension, hyperlipidemia, mild chronic dyspnea with a negative nuclear stress test in 2018, moderate to moderately severe MR and TR with mild pulmonary hypertension presents here for 71-monthoffice visit.  She has continued to remain active and denies any dyspnea or chest pain or palpitations.  No change in physical exam, S1 is crisp suggesting that probably the mitral regurgitation is moderate and not moderately severe.  However objective continue to monitor her MR and TR as it is slightly worse by echocardiogram but repeating the echocardiogram in 6 months.  I have discussed extensively regarding symptoms and signs of heart failure and plan to look for need for valvular repair.  She is tolerating  anticoagulation well.  No bleeding diathesis on Eliquis for atrial fibrillation.  A. fib is rate controlled.  She did have an accidental fall yesterday and bruised her knee and also her chest, fall precautions discussed.  Otherwise labs are within normal limits with no change from baseline, blood pressure is well controlled.  I  will see her back in 6 months following echocardiogram.     Adrian Prows, MD, [facility] 04/19/2021, 1:17 PM Office: [phone] Pager: [phone]

## 2021-05-08 ENCOUNTER — Telehealth: Payer: Self-pay

## 2021-05-08 NOTE — Telephone Encounter (Signed)
RN returned call - voicemail left for call back.

## 2021-05-08 NOTE — Progress Notes (Signed)
Patient Care Team: Crist Infante, MD as PCP - General (Internal Medicine) Nicholas Lose, MD as Consulting Physician (Hematology and Oncology) Delice Bison, Charlestine Massed, NP as Nurse Practitioner (Hematology and Oncology) Jovita Kussmaul, MD as Consulting Physician (General Surgery)  DIAGNOSIS:    ICD-10-CM   1. Malignant neoplasm of upper-inner quadrant of left breast in female, estrogen receptor positive ([place])  C50.212    Z17.0       SUMMARY OF ONCOLOGIC HISTORY: Oncology History  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[place])  05/02/2016 Mammogram   There are 3.2 cm grouped calcifications in the left breast at 12:00 posterior depth. There is architectural distortion.    05/07/2016 Initial Diagnosis   Left breast biopsy: DCIS with calcifications, high-grade, ER 95%, PR 80%, Tis N0 stage 0    06/16/2016 Surgery   Left lumpectomy: DCIS high-grade, 1.5 cm, margins clear, ER 95%, PR 80%, Tis Nx stage 0    07/28/2016 -  Anti-estrogen oral therapy   Tamoxifen 20 mg daily (Didnot need XRT)      CHIEF COMPLIANT: Follow-up of left breast DCIS on tamoxifen  INTERVAL HISTORY: Kathryn Ayala is a 86 y.o. with above-mentioned history of  left breast DCIS who underwent a left lumpectomy and is currently on anti-estrogen therapy with tamoxifen. Mammogram on 07/10/20 showed no evidence of malignancy bilaterally. She presents to the clinic today for annual follow-up.  She has had a fall and recently bruised her left breast.  She has completed 5 years of tamoxifen therapy a couple of weeks ago.  ALLERGIES:  is allergic to penicillins, codeine, elemental sulfur, and levaquin [levofloxacin].  MEDICATIONS:  Current Outpatient Medications  Medication Sig Dispense Refill   allopurinol (ZYLOPRIM) 100 MG tablet Take 100 mg by mouth 2 (two) times daily.   3   atenolol (TENORMIN) 50 MG tablet Take 50 mg by mouth every morning.   2   Biotin 1 MG CAPS Take by mouth.      Cholecalciferol (VITAMIN D3) 50 MCG (2000 UT) CAPS Take 2,000 Units by mouth daily.     diclofenac Sodium (VOLTAREN) 1 % GEL Apply 1 application topically daily.     ELIQUIS 5 MG TABS tablet TAKE 1 TABLET BY MOUTH TWICE A DAY 180 tablet 1   furosemide (LASIX) 20 MG tablet Take 1 tablet by mouth as needed.     predniSONE (DELTASONE) 5 MG tablet Take 5 mg by mouth daily with breakfast. Polymyalgia rheumatica     rosuvastatin (CRESTOR) 10 MG tablet Take 10 mg by mouth at bedtime.     tamoxifen (NOLVADEX) 20 MG tablet TAKE 1 TABLET BY MOUTH EVERY DAY 90 tablet 3   triamterene-hydrochlorothiazide (MAXZIDE-25) 37.5-25 MG tablet TAKE 1 TABLET BY MOUTH EVERY DAY 90 tablet 2   venlafaxine XR (EFFEXOR-XR) 75 MG 24 hr capsule Take by mouth daily.  11   vitamin E 180 MG (400 UNITS) capsule Take 400 Units by mouth daily.     No current facility-administered medications for this visit.    PHYSICAL EXAMINATION: ECOG PERFORMANCE STATUS: 1 - Symptomatic but completely ambulatory  Vitals:   05/09/21 1401  Pulse: 85  Resp: 18  Temp: 97.7 F (36.5 C)  SpO2: 99%   Filed Weights   05/09/21 1401  Weight: 175 lb 7 oz (79.6 kg)    BREAST: No palpable masses or nodules in either right or left breasts. No palpable axillary supraclavicular or infraclavicular adenopathy no breast tenderness or nipple discharge. (exam performed  in the presence of a chaperone)  LABORATORY DATA:  I have reviewed the data as listed CMP Latest Ref Rng & Units 06/10/2016  Glucose 65 - 99 mg/dL 122(H)  BUN 6 - 20 mg/dL 16  Creatinine 0.44 - 1.00 mg/dL 0.84  Sodium 135 - 145 mmol/L 138  Potassium 3.5 - 5.1 mmol/L 3.7  Chloride 101 - 111 mmol/L 108  CO2 22 - 32 mmol/L 22  Calcium 8.9 - 10.3 mg/dL 9.7    Lab Results  Component Value Date   WBC 6.7 06/10/2016   HGB 14.2 06/10/2016   HCT 44.9 06/10/2016   MCV 93.9 06/10/2016   PLT 175 06/10/2016    ASSESSMENT & PLAN:  Malignant neoplasm of upper-inner quadrant of left  female breast ([place]) Left lumpectomy 06/16/2016: DCIS high-grade, 1.5 cm, margins clear, ER 95%, PR 80%, Tis Nx stage 0   Current treatment: Tamoxifen 20 mg daily (Didnot need XRT) reduce to 10 mg daily 05/16/2020 completed 05/09/2021 Tamoxifen Toxicities: Severe hot flashes in spite of Effexor. She stopped tamoxifen 2 weeks ago and still gets occasional hot flashes.   Breast cancer surveillance: 1. Breast exam: 05/09/2021 benign 2. 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     RTC on an as-needed basis      No orders of the defined types were placed in this encounter.  The patient has a good understanding of the overall plan. she agrees with it. she will call with any problems that may develop before the next visit here.  Total time spent: 20 mins including face to face time and time spent for planning, charting and coordination of care  Rulon Eisenmenger, MD, MPH 05/09/2021  I, Thana Ates, am acting as scribe for Dr. Nicholas Lose.  I have reviewed the above documentation for accuracy and completeness, and I agree with the above.

## 2021-05-09 ENCOUNTER — Other Ambulatory Visit: Payer: Self-pay

## 2021-05-09 ENCOUNTER — Inpatient Hospital Stay: Payer: Medicare PPO | Attending: Hematology and Oncology | Admitting: Hematology and Oncology

## 2021-05-09 DIAGNOSIS — Z79899 Other long term (current) drug therapy: Secondary | ICD-10-CM | POA: Diagnosis not present

## 2021-05-09 DIAGNOSIS — N951 Menopausal and female climacteric states: Secondary | ICD-10-CM | POA: Diagnosis not present

## 2021-05-09 DIAGNOSIS — Z170 Estrogen receptor positive status [ER+]: Secondary | ICD-10-CM | POA: Diagnosis not present

## 2021-05-09 DIAGNOSIS — C50212 Malignant neoplasm of upper-inner quadrant of left female breast: Secondary | ICD-10-CM | POA: Insufficient documentation

## 2021-05-09 MED ORDER — PREDNISONE 1 MG PO TABS: 3.0000 mg | ORAL_TABLET | Freq: Every day | ORAL | Status: DC

## 2021-05-09 NOTE — Assessment & Plan Note (Signed)
Left lumpectomy 06/16/2016: DCIS high-grade, 1.5 cm, margins clear, ER 95%, PR 80%, Tis Nx stage 0  Current treatment: Tamoxifen 20 mg daily (Didnot need XRT) reduce to 10 mg daily 05/16/2020 Tamoxifen Toxicities: Feeling of hot flashes especially when she steps out in the sun: I instructed her to take half a tablet daily. Hair loss: Instructed her to use coconut oil.  Breast cancer surveillance: 1. Breast exam: 05/09/2021 benign 2. 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 She has poor knees and would like to get her knees done but she is unable to do so until her husband gets better.  RTC on an as-needed basis

## 2021-05-16 ENCOUNTER — Ambulatory Visit: Payer: Medicare PPO | Admitting: Hematology and Oncology

## 2021-07-04 DIAGNOSIS — M170 Bilateral primary osteoarthritis of knee: Secondary | ICD-10-CM | POA: Diagnosis not present

## 2021-07-10 ENCOUNTER — Encounter: Payer: Self-pay | Admitting: Cardiology

## 2021-07-11 DIAGNOSIS — R922 Inconclusive mammogram: Secondary | ICD-10-CM | POA: Diagnosis not present

## 2021-07-11 DIAGNOSIS — Z853 Personal history of malignant neoplasm of breast: Secondary | ICD-10-CM | POA: Diagnosis not present

## 2021-08-08 DIAGNOSIS — M353 Polymyalgia rheumatica: Secondary | ICD-10-CM | POA: Diagnosis not present

## 2021-08-08 DIAGNOSIS — M7732 Calcaneal spur, left foot: Secondary | ICD-10-CM | POA: Diagnosis not present

## 2021-08-08 DIAGNOSIS — M79675 Pain in left toe(s): Secondary | ICD-10-CM | POA: Diagnosis not present

## 2021-08-08 DIAGNOSIS — M79674 Pain in right toe(s): Secondary | ICD-10-CM | POA: Diagnosis not present

## 2021-08-08 DIAGNOSIS — Z7952 Long term (current) use of systemic steroids: Secondary | ICD-10-CM | POA: Diagnosis not present

## 2021-08-08 DIAGNOSIS — M170 Bilateral primary osteoarthritis of knee: Secondary | ICD-10-CM | POA: Diagnosis not present

## 2021-08-14 DIAGNOSIS — I480 Paroxysmal atrial fibrillation: Secondary | ICD-10-CM | POA: Diagnosis not present

## 2021-08-14 DIAGNOSIS — J069 Acute upper respiratory infection, unspecified: Secondary | ICD-10-CM | POA: Diagnosis not present

## 2021-08-14 DIAGNOSIS — J309 Allergic rhinitis, unspecified: Secondary | ICD-10-CM | POA: Diagnosis not present

## 2021-08-14 DIAGNOSIS — N1831 Chronic kidney disease, stage 3a: Secondary | ICD-10-CM | POA: Diagnosis not present

## 2021-08-14 DIAGNOSIS — R051 Acute cough: Secondary | ICD-10-CM | POA: Diagnosis not present

## 2021-08-14 DIAGNOSIS — R42 Dizziness and giddiness: Secondary | ICD-10-CM | POA: Diagnosis not present

## 2021-08-14 DIAGNOSIS — I129 Hypertensive chronic kidney disease with stage 1 through stage 4 chronic kidney disease, or unspecified chronic kidney disease: Secondary | ICD-10-CM | POA: Diagnosis not present

## 2021-08-14 DIAGNOSIS — Z1152 Encounter for screening for COVID-19: Secondary | ICD-10-CM | POA: Diagnosis not present

## 2021-08-20 DIAGNOSIS — M1711 Unilateral primary osteoarthritis, right knee: Secondary | ICD-10-CM | POA: Diagnosis not present

## 2021-08-28 ENCOUNTER — Ambulatory Visit: Payer: Medicare PPO

## 2021-08-28 ENCOUNTER — Other Ambulatory Visit: Payer: Self-pay

## 2021-08-28 DIAGNOSIS — I071 Rheumatic tricuspid insufficiency: Secondary | ICD-10-CM | POA: Diagnosis not present

## 2021-08-28 DIAGNOSIS — I340 Nonrheumatic mitral (valve) insufficiency: Secondary | ICD-10-CM | POA: Diagnosis not present

## 2021-09-02 DIAGNOSIS — E785 Hyperlipidemia, unspecified: Secondary | ICD-10-CM | POA: Diagnosis not present

## 2021-09-02 DIAGNOSIS — M109 Gout, unspecified: Secondary | ICD-10-CM | POA: Diagnosis not present

## 2021-09-02 DIAGNOSIS — R7301 Impaired fasting glucose: Secondary | ICD-10-CM | POA: Diagnosis not present

## 2021-09-02 DIAGNOSIS — D649 Anemia, unspecified: Secondary | ICD-10-CM | POA: Diagnosis not present

## 2021-09-02 DIAGNOSIS — E559 Vitamin D deficiency, unspecified: Secondary | ICD-10-CM | POA: Diagnosis not present

## 2021-09-02 DIAGNOSIS — I10 Essential (primary) hypertension: Secondary | ICD-10-CM | POA: Diagnosis not present

## 2021-09-06 DIAGNOSIS — N1831 Chronic kidney disease, stage 3a: Secondary | ICD-10-CM | POA: Diagnosis not present

## 2021-09-06 DIAGNOSIS — R0989 Other specified symptoms and signs involving the circulatory and respiratory systems: Secondary | ICD-10-CM | POA: Diagnosis not present

## 2021-09-06 DIAGNOSIS — I129 Hypertensive chronic kidney disease with stage 1 through stage 4 chronic kidney disease, or unspecified chronic kidney disease: Secondary | ICD-10-CM | POA: Diagnosis not present

## 2021-09-06 DIAGNOSIS — J069 Acute upper respiratory infection, unspecified: Secondary | ICD-10-CM | POA: Diagnosis not present

## 2021-09-06 DIAGNOSIS — R051 Acute cough: Secondary | ICD-10-CM | POA: Diagnosis not present

## 2021-09-06 DIAGNOSIS — I480 Paroxysmal atrial fibrillation: Secondary | ICD-10-CM | POA: Diagnosis not present

## 2021-09-09 DIAGNOSIS — R82998 Other abnormal findings in urine: Secondary | ICD-10-CM | POA: Diagnosis not present

## 2021-09-09 DIAGNOSIS — I272 Pulmonary hypertension, unspecified: Secondary | ICD-10-CM | POA: Diagnosis not present

## 2021-09-09 DIAGNOSIS — Z0000 Encounter for general adult medical examination without abnormal findings: Secondary | ICD-10-CM | POA: Diagnosis not present

## 2021-09-09 DIAGNOSIS — N1831 Chronic kidney disease, stage 3a: Secondary | ICD-10-CM | POA: Diagnosis not present

## 2021-09-09 DIAGNOSIS — E785 Hyperlipidemia, unspecified: Secondary | ICD-10-CM | POA: Diagnosis not present

## 2021-09-09 DIAGNOSIS — I480 Paroxysmal atrial fibrillation: Secondary | ICD-10-CM | POA: Diagnosis not present

## 2021-09-09 DIAGNOSIS — I119 Hypertensive heart disease without heart failure: Secondary | ICD-10-CM | POA: Diagnosis not present

## 2021-09-09 DIAGNOSIS — M25561 Pain in right knee: Secondary | ICD-10-CM | POA: Diagnosis not present

## 2021-09-09 DIAGNOSIS — I10 Essential (primary) hypertension: Secondary | ICD-10-CM | POA: Diagnosis not present

## 2021-09-09 DIAGNOSIS — D6869 Other thrombophilia: Secondary | ICD-10-CM | POA: Diagnosis not present

## 2021-09-09 DIAGNOSIS — F418 Other specified anxiety disorders: Secondary | ICD-10-CM | POA: Diagnosis not present

## 2021-09-10 ENCOUNTER — Telehealth: Payer: Self-pay | Admitting: Cardiology

## 2021-09-10 NOTE — Telephone Encounter (Signed)
Called and spoke to patient. Pt voiced understanding.

## 2021-09-10 NOTE — Telephone Encounter (Signed)
Pt is requesting a call with her echo results from 08/28/21

## 2021-09-13 ENCOUNTER — Other Ambulatory Visit: Payer: Self-pay | Admitting: Cardiology

## 2021-09-13 DIAGNOSIS — I10 Essential (primary) hypertension: Secondary | ICD-10-CM

## 2021-09-17 ENCOUNTER — Other Ambulatory Visit (HOSPITAL_COMMUNITY): Payer: Medicare PPO

## 2021-09-26 ENCOUNTER — Ambulatory Visit: Payer: Medicare PPO | Admitting: Cardiology

## 2021-10-07 DIAGNOSIS — I130 Hypertensive heart and chronic kidney disease with heart failure and stage 1 through stage 4 chronic kidney disease, or unspecified chronic kidney disease: Secondary | ICD-10-CM | POA: Diagnosis not present

## 2021-10-07 DIAGNOSIS — I480 Paroxysmal atrial fibrillation: Secondary | ICD-10-CM | POA: Diagnosis not present

## 2021-10-23 ENCOUNTER — Other Ambulatory Visit: Payer: Medicare PPO

## 2021-11-01 ENCOUNTER — Other Ambulatory Visit: Payer: Self-pay

## 2021-11-01 ENCOUNTER — Emergency Department: Payer: Medicare PPO

## 2021-11-01 DIAGNOSIS — I4891 Unspecified atrial fibrillation: Secondary | ICD-10-CM | POA: Insufficient documentation

## 2021-11-01 DIAGNOSIS — Z853 Personal history of malignant neoplasm of breast: Secondary | ICD-10-CM | POA: Diagnosis not present

## 2021-11-01 DIAGNOSIS — Z7901 Long term (current) use of anticoagulants: Secondary | ICD-10-CM | POA: Insufficient documentation

## 2021-11-01 DIAGNOSIS — I10 Essential (primary) hypertension: Secondary | ICD-10-CM | POA: Diagnosis not present

## 2021-11-01 DIAGNOSIS — M79604 Pain in right leg: Secondary | ICD-10-CM | POA: Diagnosis not present

## 2021-11-01 DIAGNOSIS — D72829 Elevated white blood cell count, unspecified: Secondary | ICD-10-CM | POA: Diagnosis not present

## 2021-11-01 DIAGNOSIS — Z872 Personal history of diseases of the skin and subcutaneous tissue: Secondary | ICD-10-CM | POA: Diagnosis not present

## 2021-11-01 DIAGNOSIS — L03115 Cellulitis of right lower limb: Secondary | ICD-10-CM | POA: Diagnosis not present

## 2021-11-01 DIAGNOSIS — M7989 Other specified soft tissue disorders: Secondary | ICD-10-CM | POA: Diagnosis not present

## 2021-11-01 LAB — CBC WITH DIFFERENTIAL/PLATELET
Abs Immature Granulocytes: 0.08 10*3/uL — ABNORMAL HIGH (ref 0.00–0.07)
Basophils Absolute: 0.1 10*3/uL (ref 0.0–0.1)
Basophils Relative: 1 %
Eosinophils Absolute: 0.1 10*3/uL (ref 0.0–0.5)
Eosinophils Relative: 1 %
HCT: 40.8 % (ref 36.0–46.0)
Hemoglobin: 13.2 g/dL (ref 12.0–15.0)
Immature Granulocytes: 1 %
Lymphocytes Relative: 11 %
Lymphs Abs: 1.5 10*3/uL (ref 0.7–4.0)
MCH: 32.1 pg (ref 26.0–34.0)
MCHC: 32.4 g/dL (ref 30.0–36.0)
MCV: 99.3 fL (ref 80.0–100.0)
Monocytes Absolute: 1 10*3/uL (ref 0.1–1.0)
Monocytes Relative: 7 %
Neutro Abs: 10.6 10*3/uL — ABNORMAL HIGH (ref 1.7–7.7)
Neutrophils Relative %: 79 %
Platelets: 208 10*3/uL (ref 150–400)
RBC: 4.11 MIL/uL (ref 3.87–5.11)
RDW: 14.6 % (ref 11.5–15.5)
WBC: 13.3 10*3/uL — ABNORMAL HIGH (ref 4.0–10.5)
nRBC: 0 % (ref 0.0–0.2)

## 2021-11-01 LAB — BASIC METABOLIC PANEL
Anion gap: 8 (ref 5–15)
BUN: 31 mg/dL — ABNORMAL HIGH (ref 8–23)
CO2: 27 mmol/L (ref 22–32)
Calcium: 10.3 mg/dL (ref 8.9–10.3)
Chloride: 101 mmol/L (ref 98–111)
Creatinine, Ser: 0.97 mg/dL (ref 0.44–1.00)
GFR, Estimated: 58 mL/min — ABNORMAL LOW (ref >60)
Glucose, Bld: 120 mg/dL — ABNORMAL HIGH (ref 70–99)
Potassium: 3.9 mmol/L (ref 3.5–5.1)
Sodium: 136 mmol/L (ref 135–145)

## 2021-11-01 NOTE — ED Triage Notes (Signed)
Pt presents to ER c/o right lower leg redness, pain and swelling that started today around 1730 today.  RLE is red and warm to touch at this time.  Pt denies hx of blood clots.  States she takes eliquis for afib at home.  Pt A&O x4 at this time in NAD.

## 2021-11-02 ENCOUNTER — Emergency Department
Admission: EM | Admit: 2021-11-02 | Discharge: 2021-11-02 | Disposition: A | Discharge status: Home / Self Care | Payer: Medicare PPO | Attending: Emergency Medicine | Admitting: Emergency Medicine

## 2021-11-02 DIAGNOSIS — L03115 Cellulitis of right lower limb: Secondary | ICD-10-CM

## 2021-11-02 MED ORDER — CLINDAMYCIN HCL 150 MG PO CAPS: 450.0000 mg | ORAL_CAPSULE | Freq: Three times a day (TID) | ORAL | 0 refills | Status: AC

## 2021-11-02 NOTE — ED Provider Notes (Signed)
[facility] Provider Note    Event Date/Time   First MD Initiated Contact with Patient 11/02/21 [phone]     (approximate)   History   Cellulitis   HPI  Kathryn Ayala is a 87 y.o. female with past medical history of A. fib on Eliquis, depression, HTN, kidney stones, CVA and anemia who presents for assessment of 1 day of some pain and redness on the right lower leg just above the ankle.  Patient denies any injuries to this area.  No fevers, headache, earache, sore throat, chest pain, cough, shortness of breath, vomiting, diarrhea or any other areas of redness or soreness.  No other acute concerns at this time.    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 --      Excl. in [place]? --     Most recent vital signs: Vitals:   11/01/21 2001 11/01/21 2237  BP: (!) 166/96 (!) 151/84  Pulse: 87 87  Resp: 19 16  Temp: 97.9 F (36.6 C)   SpO2: 96% 98%    General: Awake, no distress.  CV:  Good peripheral perfusion.  Right lower extremity has 2+ DP pulses. Resp:  Normal effort.  Abd:  No distention.  Other:  There is an oval area of erythema approximately 3 x 4 cm  that is warm and tender in the medial and anterior aspect of the right lower leg just proximal ankle.  No involvement of the ankle joint or proximal knee joint.  Patient has full strength and sensation in the right lower extremity.  No induration fluctuance bleeding drainage or breaks in the skin.   ED Results / Procedures / Treatments  Labs (all labs ordered are listed, but only abnormal results are displayed) Labs Reviewed  CBC WITH DIFFERENTIAL/PLATELET - Abnormal; Notable for the following components:      Result Value   WBC 13.3 (*)    Neutro Abs 10.6 (*)    Abs Immature Granulocytes 0.08 (*)    All other components within normal limits  BASIC METABOLIC PANEL - Abnormal; Notable for the following components:   Glucose, Bld 120 (*)    BUN 31 (*)    GFR, Estimated 58 (*)    All  other components within normal limits     EKG    RADIOLOGY   Right lower extremity ultrasound reviewed by myself shows no evidence of DVT, abscess, cyst or other acute process.  I also reviewed radiologist interpretation and agree with their findings.   PROCEDURES:  Critical Care performed: No  Procedures   MEDICATIONS ORDERED IN ED: Medications - No data to display   IMPRESSION / MDM / [facility] / ED COURSE  I reviewed the triage vital signs and the nursing notes.                              Differential diagnosis includes, but is not limited to DVT versus cellulitis.  No history or exam features to suggest acute traumatic injury, necrotizing infection, compartment syndrome or chronic stasis.  Right lower extremity ultrasound reviewed by myself shows no evidence of DVT, abscess, cyst or other acute process.  I also reviewed radiologist interpretation and agree with their findings.  CBC shows mild leukocytosis with WBC count of 13.3 without evidence of acute anemia.  BMP without significant electrolyte or metabolic derangements.  Overall impression is an acute cellulitis.  We  will treat with a course of outpatient biotics.  I do not believe patient is septic and I have low suspicion for other immediate life-threatening process.  Recommended close outpatient PCP follow-up.  Discharged in stable condition.  Given patient's severe penicillin allergy Rx for clindamycin.      FINAL CLINICAL IMPRESSION(S) / ED DIAGNOSES   Final diagnoses:  Cellulitis of right lower extremity     Rx / DC Orders   ED Discharge Orders          Ordered    clindamycin (CLEOCIN) 150 MG capsule  3 times daily        11/02/21 0503             Note:  This document was prepared using Dragon voice recognition software and may include unintentional dictation errors.   Lucrezia Starch, MD 11/02/21 [phone]

## 2021-11-04 ENCOUNTER — Other Ambulatory Visit: Payer: Self-pay

## 2021-11-04 ENCOUNTER — Encounter: Payer: Self-pay | Admitting: Cardiology

## 2021-11-04 ENCOUNTER — Ambulatory Visit: Payer: Medicare PPO | Admitting: Cardiology

## 2021-11-04 VITALS — BP 134/72 | HR 99 | Temp 98.1°F | Resp 16 | Ht 63.0 in | Wt 170.2 lb

## 2021-11-04 DIAGNOSIS — I340 Nonrheumatic mitral (valve) insufficiency: Secondary | ICD-10-CM

## 2021-11-04 DIAGNOSIS — I071 Rheumatic tricuspid insufficiency: Secondary | ICD-10-CM | POA: Diagnosis not present

## 2021-11-04 DIAGNOSIS — I10 Essential (primary) hypertension: Secondary | ICD-10-CM

## 2021-11-04 DIAGNOSIS — I4821 Permanent atrial fibrillation: Secondary | ICD-10-CM

## 2021-11-04 NOTE — Progress Notes (Signed)
Primary Physician/Referring:  Crist Infante, MD  Patient ID: Kathryn Ayala, female    DOB: [birth year], 87 y.o.   MRN: [id number]  Chief Complaint  Patient presents with   Mitral Regurgitation   Atrial Fibrillation   Follow-up    6 months   HPI:    Kathryn Ayala  is a 87 y.o.  Caucasian female with permanent atrial fibrillation, essential hypertension, hyperlipidemia, mild chronic dyspnea with a negative nuclear stress test in 2018, moderate to moderately severe MR and TR with mild pulmonary hypertension presents here for 102-monthoffice visit.  She has continued to remain active and denies any dyspnea or chest pain or palpitations. No bleeding diathesis on anticoagulation. No dizziness, TIA, syncope.  She has diagnosed with cellulitis involving the right leg and is presently on antibiotics.  It is painful.  Dyspnea is remained stable.  No PND or orthopnea.   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   Urinary frequency    Past Surgical History:  Procedure Laterality Date   [place]   BREAST BIOPSY Bilateral 1956, 1980   BREAST LUMPECTOMY WITH RADIOACTIVE SEED LOCALIZATION Left 06/16/2016   Procedure: LEFT BREAST LUMPECTOMY WITH RADIOACTIVE SEED LOCALIZATION;  Surgeon: PAutumn MessingIII, MD;  [facility] [facility]  Service: General;  Laterality: Left;   COLONOSCOPY     KIDNEY STONE SURGERY     KNEE SURGERY  11/20/2004   WRIST SURGERY Left    Social History   Tobacco Use   Smoking status: Never   Smokeless tobacco: Never  Substance Use Topics   Alcohol use: No   Marital Status: Married    ROS  Review of Systems  Cardiovascular:  Positive for dyspnea on exertion and palpitations (occasional). Negative for chest pain and leg swelling.  Gastrointestinal:  Negative for melena.  Objective   Vitals with BMI 11/04/2021 11/02/2021 11/01/2021  Height _0  - -  Weight 170 lbs 3 oz - -  BMI 382.42- -  Systolic [id number] Diastolic 72 84 84  Pulse 99 82 87      Physical Exam Constitutional:      Appearance: She is obese.     Comments: She is moderately built and well-nourished in no acute distress.  Neck:     Thyroid: No thyromegaly.     Vascular: No carotid bruit or JVD.  Cardiovascular:     Rate and Rhythm: Normal rate. Rhythm irregular.     Pulses: Normal pulses and intact distal pulses.     Heart sounds: S1 normal and S2 normal. Murmur heard.  High-pitched early systolic murmur is present with a grade of 2/6 at the upper right sternal border and apex.  High-pitched blowing early diastolic murmur is present with a grade of 2/4 at the upper right sternal border.    No gallop. No S3 or S4 sounds.     Comments:   Pulmonary:     Effort: Pulmonary effort is normal.  Breath sounds: Normal breath sounds.  Abdominal:     General: Bowel sounds are normal.     Palpations: Abdomen is soft.  Musculoskeletal:        General: No swelling.     Cervical back: Neck supple.     Right lower leg: No edema.     Left lower leg: No edema.  Skin:    General: Skin is warm and dry.     Capillary Refill: Capillary refill takes less than 2 seconds.     Findings: Lesion (right ankle and shin cellulitis, mild) present.  Neurological:     General: No focal deficit present.   Laboratory examination:   External labs:  Labs 09/09/2021:  BUN 31, creatinine 1.1, EGFR 47 mL, potassium 3.8, CMP otherwise normal.  Hb 14.5/HCT 43.8, platelets 190.  Normal indicis.  Total cholesterol 198, triglycerides 206, HDL 69, LDL 88.  TSH normal.  A1c  6.1%.  Medications and allergies   Allergies  Allergen Reactions   Penicillins Anaphylaxis, Swelling and Rash    Has patient had a PCN reaction causing immediate rash, facial/tongue/throat swelling, SOB or lightheadedness with hypotension: Yes Has patient had a PCN reaction causing severe rash involving mucus membranes or skin necrosis: Yes Has patient had a PCN reaction that required hospitalization No Has patient had a PCN reaction occurring within the last 10 years: No If all of the above answers are "NO", then may proceed with Cephalosporin use.    Codeine Other (See Comments)    UNSPECIFIED REACTION Patient was told not to take it.   Elemental Sulfur Other (See Comments)    unknown   Levaquin [Levofloxacin] Other (See Comments)    UNSPECIFIED REACTION Patient was told not to take it.    Current Outpatient Medications on File Prior to Visit  Medication Sig Dispense Refill   allopurinol (ZYLOPRIM) 100 MG tablet Take 100 mg by mouth 2 (two) times daily.   3   atenolol (TENORMIN) 50 MG tablet Take 50 mg by mouth every morning.   2   Biotin 1 MG CAPS Take by mouth.     Cholecalciferol (VITAMIN D3) 50 MCG (2000 UT) CAPS Take 2,000 Units by mouth daily.     clindamycin (CLEOCIN) 150 MG capsule Take 3 capsules (450 mg total) by mouth 3 (three) times daily for 5 days. 45 capsule 0   diclofenac Sodium (VOLTAREN) 1 % GEL Apply 1 application topically daily.     ELIQUIS 5 MG TABS tablet TAKE 1 TABLET BY MOUTH TWICE A DAY 180 tablet 1   furosemide (LASIX) 20 MG tablet Take 1 tablet by mouth as needed.     predniSONE (DELTASONE) 1 MG tablet Take 3 tablets (3 mg total) by mouth daily with breakfast. Polymyalgia rheumatica (Patient taking differently: Take 4 mg by mouth daily with breakfast. Polymyalgia rheumatica)     rosuvastatin (CRESTOR) 10 MG tablet Take 10 mg by mouth at bedtime.     triamterene-hydrochlorothiazide (MAXZIDE-25) 37.5-25 MG tablet TAKE 1 TABLET BY MOUTH  EVERY DAY 90 tablet 2   venlafaxine XR (EFFEXOR-XR) 75 MG 24 hr capsule Take by mouth daily.  11   vitamin E 180 MG (400 UNITS) capsule Take 400 Units by mouth daily.     No current facility-administered medications on file prior to visit.    Radiology:  No results found. Cardiac Studies:   Lexiscan myoview stress test 12/12/2016: 1. The resting electrocardiogram demonstrated atrial fibrillation, normal resting conduction and nonspecific ST-T changes, cannot exclude  inferolateral ischemia.  Stress EKG was nondiagnostic for ischemia as it's a pharmacologic stress test.  Stress symptoms included dyspnea. 2. Myocardial perfusion imaging is normal. Overall left ventricular systolic function was normal without regional wall motion abnormalities. The left ventricular ejection fraction was 73%. No significant change from [place] stress 12/19/11.  Echocardiogram 10/30/2020: Left ventricle cavity is normal in size and wall thickness. Normal LV systolic function with EF 64%. Normal global wall motion. Unable to evaluate diastolic function due to atrial fibrillation. Left atrial cavity is severely dilated. Right atrial cavity is mildly dilated. Structurally normal trileaflet aortic valve. No evidence of aortic stenosis. Mild (Grade I) aortic regurgitation. Moderate to severe mitral regurgitation. Moderate to severe tricuspid regurgitation. Estimated pulmonary artery systolic pressure 25 mmHg. No significant change compared to previous study in 2018, MR is slightly worse.   EKG:  EKG 04/19/2021: Atrial fibrillation with controlled ventricular response at the rate of 73 bpm, normal axis, poor R progression, probably normal variant.  Nonspecific T abnormality.  No change from 09/26/2020.  Assessment     ICD-10-CM   1. Permanent atrial fibrillation (HCC)  I48.21 EKG 12-Lead    2. Moderate to severe mitral regurgitation  I34.0     3. Moderate tricuspid regurgitation  I07.1     4. Essential  hypertension  I10      CHA2DS2-VASc Score is 4.  Yearly risk of stroke: 4.8% (A, F, HTN).  Score of 1=0.6; 2=2.2; 3=3.2; 4=4.8; 5=7.2; 6=9.8; 7=>9.8) -(CHF; HTN; vasc disease DM,  Female = 1; Age <65 =0; 65-74 = 1,  >75 =2; stroke/embolism= 2).    No orders of the defined types were placed in this encounter.   There are no discontinued medications.  Recommendations:   Kathryn Ayala  is a 87 y.o.   Caucasian female with permanent atrial fibrillation, essential hypertension, hyperlipidemia, mild chronic dyspnea with a negative nuclear stress test in 2018, moderate to moderately severe MR and TR with mild pulmonary hypertension presents here for 60-monthoffice visit.  She has continued to remain active and denies any dyspnea or chest pain or palpitations.  She has mild cellulitis of the right leg and is now being treated with antibiotics and it is healing.  No change in physical exam, no clinical evidence of heart failure.  Reviewed the echocardiogram again with the patient, no change from prior echocardiogram in 2018.  Overall from valvular heart disease she is remained stable.  With regard to atrial fibrillation, she is tolerating anticoagulation without any bleeding diathesis.  She does have mild chronic renal insufficiency stage IIIa and that is remained stable and CBC is normal.  No changes were done today, blood pressure is well controlled, I will see her back in a year.        JAdrian Prows MD, FChangepoint Psychiatric Hospital1/16/2023, 11:07 AM Office: [phone]: [phone]

## 2021-11-20 ENCOUNTER — Other Ambulatory Visit (HOSPITAL_COMMUNITY): Payer: Medicare PPO

## 2021-12-02 ENCOUNTER — Ambulatory Visit: Admit: 2021-12-02 | Payer: Medicare PPO | Admitting: Orthopedic Surgery

## 2021-12-02 SURGERY — ARTHROPLASTY, KNEE, TOTAL
Anesthesia: Choice | Site: Knee | Laterality: Right

## 2021-12-24 DIAGNOSIS — J309 Allergic rhinitis, unspecified: Secondary | ICD-10-CM | POA: Diagnosis not present

## 2021-12-24 DIAGNOSIS — I130 Hypertensive heart and chronic kidney disease with heart failure and stage 1 through stage 4 chronic kidney disease, or unspecified chronic kidney disease: Secondary | ICD-10-CM | POA: Diagnosis not present

## 2021-12-24 DIAGNOSIS — R42 Dizziness and giddiness: Secondary | ICD-10-CM | POA: Diagnosis not present

## 2021-12-24 DIAGNOSIS — I480 Paroxysmal atrial fibrillation: Secondary | ICD-10-CM | POA: Diagnosis not present

## 2021-12-30 ENCOUNTER — Encounter: Payer: Self-pay | Admitting: Cardiology

## 2022-01-01 DIAGNOSIS — I872 Venous insufficiency (chronic) (peripheral): Secondary | ICD-10-CM | POA: Diagnosis not present

## 2022-01-01 DIAGNOSIS — I739 Peripheral vascular disease, unspecified: Secondary | ICD-10-CM | POA: Diagnosis not present

## 2022-01-01 DIAGNOSIS — I10 Essential (primary) hypertension: Secondary | ICD-10-CM | POA: Diagnosis not present

## 2022-01-01 DIAGNOSIS — I83893 Varicose veins of bilateral lower extremities with other complications: Secondary | ICD-10-CM | POA: Diagnosis not present

## 2022-01-01 DIAGNOSIS — L03115 Cellulitis of right lower limb: Secondary | ICD-10-CM | POA: Diagnosis not present

## 2022-01-01 DIAGNOSIS — E785 Hyperlipidemia, unspecified: Secondary | ICD-10-CM | POA: Diagnosis not present

## 2022-01-30 DIAGNOSIS — I739 Peripheral vascular disease, unspecified: Secondary | ICD-10-CM | POA: Insufficient documentation

## 2022-01-30 DIAGNOSIS — M170 Bilateral primary osteoarthritis of knee: Secondary | ICD-10-CM | POA: Diagnosis not present

## 2022-01-30 NOTE — Progress Notes (Signed)
? ? ?Requested by:  ?Crist Infante, MD ?[address] ?[place],  [place] [address] ? ?Reason for consultation: LLE swelling  ? ? ?History of Present Illness  ? ?Kathryn Ayala is a 87 y.o. (03-31-1935) female who presents for evaluation of left leg swelling 1 year. Started out intermittnely now it is more constant. The swelling is mostly around her left ankle. She says when she firsts wakes up it is usually down but then as the day goes on it increases. She has only have very rare instances of right ankle swelling. She has been elevating with a wedge with some improvement. Also was given PRN Lasix which helps. She bought a pain of compression stockings from a medical supply store but was never able to put them on. She explains that she has "bone on bone" in both her knees and is at point where she has so much pain she cannot ambulate far distances. She normally around her house uses cane. She does report that she is seeing Dr. Marry Guan, an Orthopedic surgeon, to have her knee replacements in the near future. She otherwise denies any itching, burning, pinching, throbbing, bleeding, ulcer in her legs. She has been diagnosed with Fibromyalgia so she says her legs stay very sore all the time. She has no history of DVT. No family history of DVT or venous disease.  ? ?Venous symptoms include: swelling ?Onset/duration:  > 1 year  ?Occupation:  retired ?Aggravating factors: sitting, standing, prolonged ambulation ?Alleviating factors: elevation, fluid pills ?Compression:  no Helps:  n/a ?Pain medications:  none ?Previous vein procedures:  no ?History of DVT:  no ? ?Past Medical History:  ?Diagnosis Date  ? Anemia   ? after a pregnancy  ? Anxiety   ? Arthritis   ? Atrial fibrillation ([place])   ? Breast cancer, left ([place])   ? Complication of anesthesia   ? rapid heart beat afterwards sometimes  ? Depression   ? Hypertension   ? Kidney stones   ? Pneumonia   ? Stroke [facility])   ? 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  Urinary frequency   ? ? ?Past Surgical History:  ?Procedure Laterality Date  ? ABDOMINAL HYSTERECTOMY  1996  ? APPENDECTOMY  1956  ? BREAST BIOPSY Left 05/07/2016  ? BREAST BIOPSY Bilateral 1956, 1980  ? BREAST LUMPECTOMY WITH RADIOACTIVE SEED LOCALIZATION Left 06/16/2016  ? Procedure: LEFT BREAST LUMPECTOMY WITH RADIOACTIVE SEED LOCALIZATION;  Surgeon: Autumn Messing III, MD;  Location: [facility];  Service: General;  Laterality: Left;  ? COLONOSCOPY    ? KIDNEY STONE SURGERY    ? KNEE SURGERY  11/20/2004  ? WRIST SURGERY Left   ? ? ?Social History  ? ?Socioeconomic History  ? Marital status: Married  ?  Spouse name: Not on file  ? Number of children: 2  ? Years of education: Not on file  ? Highest education level: Not on file  ?Occupational History  ? Not on file  ?Tobacco Use  ? Smoking status: Never  ?  Passive exposure: Never  ? Smokeless tobacco: Never  ?Vaping Use  ? Vaping Use: Never used  ?Substance and Sexual Activity  ? Alcohol use: No  ? Drug use: No  ? Sexual activity: Not on file  ?Other Topics Concern  ? Not on file  ?Social History Narrative  ? Not on file  ? ?Social Determinants of Health  ? ?Financial Resource Strain: Not on file  ?Food Insecurity:  Not on file  ?Transportation Needs: Not on file  ?Physical Activity: Not on file  ?Stress: Not on file  ?Social Connections: Not on file  ?Intimate Partner Violence: Not on file  ? ? ?Family History  ?Problem Relation Age of Onset  ? Lung cancer Brother   ? Stroke Mother   ? Pneumonia Father   ? Cancer Brother   ? ? ?Current Outpatient Medications  ?Medication Sig Dispense Refill  ? allopurinol (ZYLOPRIM) 100 MG tablet Take 100 mg by mouth 2 (two) times daily.   3  ? atenolol (TENORMIN) 50 MG tablet Take 50 mg by mouth every morning.   2  ? Biotin 10 MG TABS Take 1 tablet by mouth daily.    ? Cholecalciferol 50 MCG (2000 UT) TABS Take by mouth.    ? Cyanocobalamin 2000 MCG TBCR Take by mouth.    ? diclofenac Sodium (VOLTAREN) 1 % GEL Apply 1 application topically  daily.    ? ELIQUIS 5 MG TABS tablet TAKE 1 TABLET BY MOUTH TWICE A DAY 180 tablet 1  ? furosemide (LASIX) 20 MG tablet Take 1 tablet by mouth as needed.    ? predniSONE (DELTASONE) 1 MG tablet Take 3 tablets (3 mg total) by mouth daily with breakfast. Polymyalgia rheumatica (Patient taking differently: Take 4 mg by mouth daily with breakfast. Polymyalgia rheumatica)    ? rosuvastatin (CRESTOR) 10 MG tablet Take 10 mg by mouth at bedtime.    ? triamterene-hydrochlorothiazide (MAXZIDE-25) 37.5-25 MG tablet TAKE 1 TABLET BY MOUTH EVERY DAY 90 tablet 2  ? venlafaxine XR (EFFEXOR-XR) 75 MG 24 hr capsule Take by mouth daily.  11  ? vitamin E 180 MG (400 UNITS) capsule Take 400 Units by mouth daily.    ? ?No current facility-administered medications for this visit.  ? ? ?Allergies  ?Allergen Reactions  ? Penicillins Anaphylaxis, Swelling and Rash  ?  Has patient had a PCN reaction causing immediate rash, facial/tongue/throat swelling, SOB or lightheadedness with hypotension: Yes ?Has patient had a PCN reaction causing severe rash involving mucus membranes or skin necrosis: Yes ?Has patient had a PCN reaction that required hospitalization No ?Has patient had a PCN reaction occurring within the last 10 years: No ?If all of the above answers are "NO", then may proceed with Cephalosporin use. ?  ? Azithromycin   ?  Other reaction(s): Unknown  ? Codeine Other (See Comments)  ?  UNSPECIFIED REACTION ?Patient was told not to take it.  ? Elemental Sulfur Other (See Comments)  ?  unknown  ? Levaquin [Levofloxacin] Other (See Comments)  ?  UNSPECIFIED REACTION ?Patient was told not to take it.  ? Sulfa Antibiotics Itching and Rash  ? ? ?REVIEW OF SYSTEMS (negative unless checked):  ? ?Cardiac:  ?'[]'$  Chest pain or chest pressure? ?'[]'$  Shortness of breath upon activity? ?'[]'$  Shortness of breath when lying flat? ?'[]'$  Irregular heart rhythm? ? ?Vascular:  ?'[]'$  Pain in calf, thigh, or hip brought on by walking? ?'[]'$  Pain in feet at night that  wakes you up from your sleep? ?'[]'$  Blood clot in your veins? ?'[x]'$  Leg swelling? ? ?Pulmonary:  ?'[]'$  Oxygen at home? ?'[]'$  Productive cough? ?'[]'$  Wheezing? ? ?Neurologic:  ?'[]'$  Sudden weakness in arms or legs? ?'[]'$  Sudden numbness in arms or legs? ?'[]'$  Sudden onset of difficult speaking or slurred speech? ?'[]'$  Temporary loss of vision in one eye? ?'[]'$  Problems with dizziness? ? ?Gastrointestinal:  ?'[]'$  Blood in stool? ?'[]'$  Vomited blood? ? ?Genitourinary:  ?'[]'$   Burning when urinating? ?'[]'$  Blood in urine? ? ?Psychiatric:  ?'[]'$  Major depression ? ?Hematologic:  ?'[]'$  Bleeding problems? ?'[]'$  Problems with blood clotting? ? ?Dermatologic:  ?'[]'$  Rashes or ulcers? ? ?Constitutional:  ?'[]'$  Fever or chills? ? ?Ear/Nose/Throat:  ?'[]'$  Change in hearing? ?'[]'$  Nose bleeds? ?'[]'$  Sore throat? ? ?Musculoskeletal:  ?'[]'$  Back pain? ?'[]'$  Joint pain? ?'[]'$  Muscle pain? ? ? ?Physical Examination  ?  ? ?Vitals:  ? 02/06/22 0847  ?BP: 130/78  ?Pulse: 77  ?Resp: 20  ?Temp: 98.1 ?F (36.7 ?C)  ?TempSrc: Temporal  ?SpO2: 99%  ?Weight: 160 lb (72.6 kg)  ?Height: '5\' 3"'$  (1.6 m)  ? ?Body mass index is 28.34 kg/m?. ? ?General:  WDWN in NAD; vital signs documented above ?Gait: Not observed, in wheel chair ?HENT: WNL, normocephalic ?Pulmonary: normal non-labored breathing , without  wheezing ?Cardiac: regular HR, without  Murmurs or carotid bruit ?Abdomen: soft, NT, no masses ?Vascular Exam/Pulses: ? Right Left  ?Radial 2+ (normal) 2+ (normal)  ?Femoral 2+ (normal) 2+ (normal)  ?Popliteal 2+ (normal) 2+ (normal)  ?DP 1+ (weak) 1+ (weak)  ?PT 2+ (normal) 2+ (normal)  ? ?Extremities: without varicose veins, with reticular veins bilateral lower extremities, with edema bilaterally L> R ankle, without stasis pigmentation, without lipodermatosclerosis, without ulcers ?Musculoskeletal: no muscle wasting or atrophy  ?Neurologic: A&O X 3;  No focal weakness or paresthesias are detected ?Psychiatric:  The pt has Normal affect. ? ?Non-invasive Vascular Imaging  ? ?BLE Venous  Insufficiency Duplex (02/06/22):  ?LLE:  ?No DVT and SVT,  ?GSV reflux SFJ to proximal thigh ?GSV diameter 0.24-0.31 ?No SSV reflux  ?CFV, prox FV deep venous reflux ? ?Medical Decision Making  ? ?[place]

## 2022-02-03 ENCOUNTER — Other Ambulatory Visit: Payer: Self-pay | Admitting: *Deleted

## 2022-02-03 DIAGNOSIS — M25562 Pain in left knee: Secondary | ICD-10-CM

## 2022-02-03 DIAGNOSIS — I872 Venous insufficiency (chronic) (peripheral): Secondary | ICD-10-CM

## 2022-02-03 NOTE — Progress Notes (Signed)
US ordered

## 2022-02-06 ENCOUNTER — Ambulatory Visit (HOSPITAL_COMMUNITY)
Admission: RE | Admit: 2022-02-06 | Discharge: 2022-02-06 | Disposition: A | Discharge status: Home / Self Care | Payer: Medicare PPO | Source: Ambulatory Visit | Attending: Physician Assistant | Admitting: Physician Assistant

## 2022-02-06 ENCOUNTER — Ambulatory Visit: Payer: Medicare PPO | Admitting: Physician Assistant

## 2022-02-06 ENCOUNTER — Encounter: Payer: Self-pay | Admitting: Physician Assistant

## 2022-02-06 VITALS — BP 130/78 | HR 77 | Temp 98.1°F | Resp 20 | Ht 63.0 in | Wt 160.0 lb

## 2022-02-06 DIAGNOSIS — M7989 Other specified soft tissue disorders: Secondary | ICD-10-CM

## 2022-02-06 DIAGNOSIS — I83893 Varicose veins of bilateral lower extremities with other complications: Secondary | ICD-10-CM | POA: Insufficient documentation

## 2022-02-06 DIAGNOSIS — M25562 Pain in left knee: Secondary | ICD-10-CM | POA: Insufficient documentation

## 2022-02-06 DIAGNOSIS — M25561 Pain in right knee: Secondary | ICD-10-CM | POA: Diagnosis not present

## 2022-02-06 DIAGNOSIS — I872 Venous insufficiency (chronic) (peripheral): Secondary | ICD-10-CM | POA: Insufficient documentation

## 2022-02-06 DIAGNOSIS — L03115 Cellulitis of right lower limb: Secondary | ICD-10-CM | POA: Insufficient documentation

## 2022-02-06 DIAGNOSIS — R42 Dizziness and giddiness: Secondary | ICD-10-CM | POA: Insufficient documentation

## 2022-02-06 DIAGNOSIS — N1831 Chronic kidney disease, stage 3a: Secondary | ICD-10-CM | POA: Insufficient documentation

## 2022-02-17 ENCOUNTER — Encounter: Payer: Self-pay | Admitting: Cardiology

## 2022-03-11 DIAGNOSIS — I130 Hypertensive heart and chronic kidney disease with heart failure and stage 1 through stage 4 chronic kidney disease, or unspecified chronic kidney disease: Secondary | ICD-10-CM | POA: Diagnosis not present

## 2022-03-11 DIAGNOSIS — D6869 Other thrombophilia: Secondary | ICD-10-CM | POA: Diagnosis not present

## 2022-03-11 DIAGNOSIS — I480 Paroxysmal atrial fibrillation: Secondary | ICD-10-CM | POA: Diagnosis not present

## 2022-03-11 DIAGNOSIS — I83893 Varicose veins of bilateral lower extremities with other complications: Secondary | ICD-10-CM | POA: Diagnosis not present

## 2022-03-11 DIAGNOSIS — E785 Hyperlipidemia, unspecified: Secondary | ICD-10-CM | POA: Diagnosis not present

## 2022-03-11 DIAGNOSIS — I872 Venous insufficiency (chronic) (peripheral): Secondary | ICD-10-CM | POA: Diagnosis not present

## 2022-03-11 DIAGNOSIS — R7301 Impaired fasting glucose: Secondary | ICD-10-CM | POA: Diagnosis not present

## 2022-03-11 DIAGNOSIS — I739 Peripheral vascular disease, unspecified: Secondary | ICD-10-CM | POA: Diagnosis not present

## 2022-03-11 DIAGNOSIS — F418 Other specified anxiety disorders: Secondary | ICD-10-CM | POA: Diagnosis not present

## 2022-03-25 NOTE — Patient Instructions (Addendum)
DUE TO COVID-19 ONLY TWO VISITORS  (aged 85 and older)  ARE ALLOWED TO COME WITH YOU AND STAY IN THE WAITING ROOM ONLY DURING PRE OP AND PROCEDURE.   **NO VISITORS ARE ALLOWED IN THE SHORT STAY AREA OR RECOVERY ROOM!!**  IF YOU WILL BE ADMITTED INTO THE HOSPITAL YOU ARE ALLOWED ONLY FOUR SUPPORT PEOPLE DURING VISITATION HOURS ONLY (7 AM -8PM)   The support person(s) must pass our screening, gel in and out, and wear a mask at all times, including in the patient's room. Patients must also wear a mask when staff or their support person are in the room. Visitors GUEST BADGE MUST BE WORN VISIBLY  One adult visitor may remain with you overnight and MUST be in the room by 8 P.M.     Your procedure is scheduled on: 04/07/22   Report to [facility] Main Entrance    Report to admitting at   5:50 AM   Call this number if you have problems the morning of surgery [phone]   Do not eat food :After Midnight.   After Midnight you may have the following liquids until __5:00____ AM/  DAY OF SURGERY  Water Black Coffee (sugar ok, NO MILK/CREAM OR CREAMERS)  Tea (sugar ok, NO MILK/CREAM OR CREAMERS) regular and decaf                             Plain Jell-O (NO RED)                                           Fruit ices (not with fruit pulp, NO RED)                                     Popsicles (NO RED)                                                                  Juice: apple, WHITE grape, WHITE cranberry Sports drinks like Gatorade (NO RED) Clear broth(vegetable,chicken,beef)                    The day of surgery:  Drink ONE (1) Pre-Surgery Clear Ensure  at 4:45 AM the morning of surgery. Drink in one sitting. Do not sip.  This drink was given to you during your hospital  pre-op appointment visit. Nothing else to drink after completing the  Pre-Surgery Clear Ensure 5:00 AM          If you have questions, please contact your surgeon's office.     Oral Hygiene is also  important to reduce your risk of infection.                                    Remember - BRUSH YOUR TEETH THE MORNING OF SURGERY WITH YOUR REGULAR TOOTHPASTE    Take these medicines the morning of surgery with A SIP OF WATER: Venlafaxine, Atenolol, Allopurinol    Before surgery.Stop  taking __Eliquis_________on __6/15________as instructed by _____________. Marland Kitchen  Contact your Surgeon/Cardiologist for instructions on Anticoagulant Therapy prior to surgery.                                You may not have any metal on your body including hair pins, jewelry, and body piercing             Do not wear make-up, lotions, powders, perfumes/cologne, or deodorant  Do not wear nail polish including gel and S&S, artificial/acrylic nails, or any other type of covering on natural nails including finger and toenails. If you have artificial nails, gel coating, etc. that needs to be removed by a nail salon please have this removed prior to surgery or surgery may need to be canceled/ delayed if the surgeon/ anesthesia feels like they are unable to be safely monitored.   Do not shave  48 hours prior to surgery.     Do not bring valuables to the hospital. [place].   Contacts, dentures or bridgework may not be worn into surgery.   Bring small overnight bag day of surgery.     Special Instructions: Bring a copy of your healthcare power of attorney and living will documents   the day of surgery if you haven't scanned them before.              Please read over the following fact sheets you were given: IF YOU HAVE QUESTIONS ABOUT YOUR PRE-OP INSTRUCTIONS PLEASE CALL [phone]     [facility] - Preparing for Surgery Before surgery, you can play an important role.  Because skin is not sterile, your skin needs to be as free of germs as possible.  You can reduce the number of germs on your skin by washing with CHG (chlorahexidine gluconate) soap before surgery.   CHG is an antiseptic cleaner which kills germs and bonds with the skin to continue killing germs even after washing. Please DO NOT use if you have an allergy to CHG or antibacterial soaps.  If your skin becomes reddened/irritated stop using the CHG and inform your nurse when you arrive at Short Stay. Do not shave (including legs and underarms) for at least 48 hours prior to the first CHG shower.  Please follow these instructions carefully:  1.  Shower with CHG Soap the night before surgery and the  morning of Surgery.  2.  If you choose to wash your hair, wash your hair first as usual with your  normal  shampoo.  3.  After you shampoo, rinse your hair and body thoroughly to remove the  shampoo.                            4.  Use CHG as you would any other liquid soap.  You can apply chg directly  to the skin and wash                       Gently with a scrungie or clean washcloth.  5.  Apply the CHG Soap to your body ONLY FROM THE NECK DOWN.   Do not use on face/ open  Wound or open sores. Avoid contact with eyes, ears mouth and genitals (private parts).                       Wash face,  Genitals (private parts) with your normal soap.             6.  Wash thoroughly, paying special attention to the area where your surgery  will be performed.  7.  Thoroughly rinse your body with warm water from the neck down.  8.  DO NOT shower/wash with your normal soap after using and rinsing off  the CHG Soap.             9.  Pat yourself dry with a clean towel.            10.  Wear clean pajamas.            11.  Place clean sheets on your bed the night of your first shower and do not  sleep with pets. Day of Surgery : Do not apply any lotions/deodorants the morning of surgery.  Please wear clean clothes to the hospital/surgery center.  FAILURE TO FOLLOW THESE INSTRUCTIONS MAY RESULT IN THE CANCELLATION OF YOUR  SURGERY    ________________________________________________________________________   Incentive Spirometer  An incentive spirometer is a tool that can help keep your lungs clear and active. This tool measures how well you are filling your lungs with each breath. Taking long deep breaths may help reverse or decrease the chance of developing breathing (pulmonary) problems (especially infection) following: A long period of time when you are unable to move or be active. BEFORE THE PROCEDURE  If the spirometer includes an indicator to show your best effort, your nurse or respiratory therapist will set it to a desired goal. If possible, sit up straight or lean slightly forward. Try not to slouch. Hold the incentive spirometer in an upright position. INSTRUCTIONS FOR USE  Sit on the edge of your bed if possible, or sit up as far as you can in bed or on a chair. Hold the incentive spirometer in an upright position. Breathe out normally. Place the mouthpiece in your mouth and seal your lips tightly around it. Breathe in slowly and as deeply as possible, raising the piston or the ball toward the top of the column. Hold your breath for 3-5 seconds or for as long as possible. Allow the piston or ball to fall to the bottom of the column. Remove the mouthpiece from your mouth and breathe out normally. Rest for a few seconds and repeat Steps 1 through 7 at least 10 times every 1-2 hours when you are awake. Take your time and take a few normal breaths between deep breaths. The spirometer may include an indicator to show your best effort. Use the indicator as a goal to work toward during each repetition. After each set of 10 deep breaths, practice coughing to be sure your lungs are clear. If you have an incision (the cut made at the time of surgery), support your incision when coughing by placing a pillow or rolled up towels firmly against it. Once you are able to get out of bed, walk around indoors and  cough well. You may stop using the incentive spirometer when instructed by your caregiver.  RISKS AND COMPLICATIONS Take your time so you do not get dizzy or light-headed. If you are in pain, you may need to take or ask for pain medication before doing  incentive spirometry. It is harder to take a deep breath if you are having pain. AFTER USE Rest and breathe slowly and easily. It can be helpful to keep track of a log of your progress. Your caregiver can provide you with a simple table to help with this. If you are using the spirometer at home, follow these instructions: [place] IF:  You are having difficultly using the spirometer. You have trouble using the spirometer as often as instructed. Your pain medication is not giving enough relief while using the spirometer. You develop fever of 100.5 F (38.1 C) or higher. SEEK IMMEDIATE MEDICAL CARE IF:  You cough up bloody sputum that had not been present before. You develop fever of 102 F (38.9 C) or greater. You develop worsening pain at or near the incision site. MAKE SURE YOU:  Understand these instructions. Will watch your condition. Will get help right away if you are not doing well or get worse. Document Released: 02/16/2007 Document Revised: 12/29/2011 Document Reviewed: 04/19/2007 [facility] Patient Information 2014 [place], [place].   ________________________________________________________________________

## 2022-03-25 NOTE — H&P (Addendum)
TOTAL KNEE ADMISSION H&P  Patient is being admitted for right total knee arthroplasty.  Subjective:  Chief Complaint: Right knee pain.  HPI: Kathryn Ayala, 87 y.o. female has a history of pain and functional disability in the right knee due to trauma and has failed non-surgical conservative treatments for greater than 12 weeks to include NSAID's and/or analgesics, corticosteriod injections, viscosupplementation injections, and activity modification. Onset of symptoms was gradual, starting  several  years ago with gradually worsening course since that time. The patient noted no past surgery on the right knee.  Patient currently rates pain in the right knee at 9 out of 10 with activity. Patient has night pain, worsening of pain with activity and weight bearing, pain that interferes with activities of daily living, and pain with passive range of motion. Patient has evidence of bone-on-bone arthritis in the medial and patellofemoral compartments, worse on the right than the left by imaging studies. There is no active infection.  Patient Active Problem List   Diagnosis Date Noted   Cellulitis of right lower limb 02/06/2022   Chronic kidney disease, stage 3a ([place]) 02/06/2022   Dizzy 02/06/2022   Peripheral venous insufficiency 02/06/2022   Varicose veins of bilateral lower extremities with other complications 30/16/0109   Peripheral vascular disease ([place]) 01/30/2022   Thrombophilia ([place]) 12/15/2019   Pulmonary hypertension ([place]) 09/09/2019   Vitamin D deficiency 08/25/2019   Primary osteoarthritis of both knees 08/16/2019   Acute otitis externa of left ear 03/08/2019   Sensorineural hearing loss (SNHL) of right ear with unrestricted hearing of left ear 03/08/2019   Current chronic use of systemic steroids 02/11/2019   Muscle pain 12/24/2018   Unilateral primary osteoarthritis, right knee 04/06/2018   Dermatitis 12/29/2017   Impacted cerumen 09/24/2017   Asymptomatic microscopic hematuria  06/24/2017   Trigger finger, right middle finger 02/16/2017   Acute pain of right knee 02/16/2017   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[place]) 05/26/2016   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/12/2016   Encounter for general adult medical examination without abnormal findings 05/05/2016   Chronic kidney disease due to hypertension 02/22/2015   Disorder of bone 06/21/2013   Gout 04/26/2012   Hyperlipidemia 04/26/2012   Proteinuria 04/26/2012   Paroxysmal atrial fibrillation ([place]) 12/08/2011   Underimmunization status 08/01/2010   Hypokalemia 01/07/2010   Impaired fasting glucose 01/07/2010   Allergic rhinitis 12/11/2009   Anemia 12/11/2009   Anxiety disorder 12/11/2009   Diaphragmatic hernia 12/11/2009   Essential hypertension 12/11/2009   Fibromyalgia 12/11/2009    Past Medical History:  Diagnosis Date   Anemia    after a pregnancy   Anxiety    Arthritis    Atrial fibrillation ([place])    Breast cancer, left ([place])    Complication of anesthesia    rapid heart beat afterwards sometimes   Depression    Hypertension    Kidney stones    Pneumonia    Stroke [facility])    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 Authorizing Provider  acetaminophen (TYLENOL) 500 MG tablet Take 1,000 mg by mouth every 6 (six) hours as needed for moderate pain.    Yes [provider]  allopurinol (ZYLOPRIM) 100 MG tablet Take 100 mg by mouth 2 (two) times daily.  03/04/16  Yes [provider]  atenolol (TENORMIN) 50 MG tablet Take 50 mg by mouth 2 (two) times daily. 05/05/16  Yes [provider]  Biotin (BIOTIN 5000) 5 MG CAPS Take 5 mg by mouth daily.   Yes [provider]  Cholecalciferol 50 MCG (2000 UT) TABS Take 2,000 Units by mouth daily.   Yes [provider]  diclofenac Sodium (VOLTAREN) 1 % GEL Apply 1 application. topically 4 (four) times daily as needed (pain). 04/19/21  Yes [provider]  ELIQUIS 5 MG TABS tablet TAKE 1 TABLET BY MOUTH TWICE A DAY 11/14/19  Yes Adrian Prows, MD  furosemide (LASIX) 20 MG tablet Take 20 mg by mouth daily as needed for edema.   Yes [provider]  loratadine (CLARITIN) 10 MG tablet Take 10 mg by mouth daily.   Yes [provider]  rosuvastatin (CRESTOR) 20 MG tablet Take 10 mg by mouth daily.   Yes [provider]  triamcinolone cream (KENALOG) 0.1 % Apply 1 application. topically at bedtime as needed (redness / itching).   Yes [provider]  triamterene-hydrochlorothiazide (MAXZIDE-25) 37.5-25 MG tablet TAKE 1 TABLET BY MOUTH EVERY DAY 09/16/21  Yes Adrian Prows, MD  venlafaxine XR (EFFEXOR-XR) 37.5 MG 24 hr capsule Take 37.5 mg by mouth daily.   Yes [provider]  vitamin B-12 (CYANOCOBALAMIN) 500 MCG tablet Take 500 mcg by mouth daily.   Yes [provider]  vitamin E 180 MG (400 UNITS) capsule Take 400 Units by mouth daily.   Yes [provider]    Allergies  Allergen Reactions   Penicillins Anaphylaxis, Swelling and Rash    Has patient had a PCN reaction causing immediate rash, facial/tongue/throat swelling, SOB or lightheadedness with hypotension: Yes Has patient had a PCN reaction causing severe rash involving mucus membranes or skin necrosis: Yes Has patient had a PCN reaction that required  hospitalization No Has patient had a PCN reaction occurring within the last 10 years: No If all of the above answers are "NO", then may proceed with Cephalosporin use.    Azithromycin     Unknown   Codeine Other (See Comments)    UNSPECIFIED REACTION Patient was told not to take it.   Levaquin [Levofloxacin] Other (See Comments)    UNSPECIFIED REACTION Patient was told not to take it.   Elemental Sulfur Itching and Rash   Sulfa Antibiotics Itching and Rash    Social History   Socioeconomic History   Marital status: Married    Spouse name: Not on file   Number of children: 2   Years of education: Not on file   Highest education level: Not on file  Occupational History   Not on file  Tobacco Use   Smoking status: Never    Passive exposure: Never   Smokeless tobacco: Never  Vaping Use   Vaping Use: Never used  Substance and Sexual Activity   Alcohol use: No   Drug use: No   Sexual activity: Not  on file  Other Topics Concern   Not on file  Social History Narrative   Not on file   Social Determinants of Health   Financial Resource Strain: Not on file  Food Insecurity: Not on file  Transportation Needs: Not on file  Physical Activity: Not on file  Stress: Not on file  Social Connections: Not on file  Intimate Partner Violence: Not on file    Tobacco Use: Low Risk    Smoking Tobacco Use: Never   Smokeless Tobacco Use: Never   Passive Exposure: Never   Social History   Substance and Sexual Activity  Alcohol Use No    Family History  Problem Relation Age of Onset   Lung cancer Brother    Stroke Mother    Pneumonia Father    Cancer Brother     Review of Systems  Constitutional:  Negative for chills and fever.  HENT: Negative.    Eyes: Negative.   Respiratory:  Negative for cough and shortness of breath.   Cardiovascular:  Negative for chest pain and palpitations.  Genitourinary:  Negative for dysuria, frequency and urgency.  Musculoskeletal:   Positive for joint pain.  Skin:  Negative for rash.   Objective:  Physical Exam: Well nourished and well developed.  General: Alert and oriented x3, cooperative and pleasant, no acute distress.  Head: normocephalic, atraumatic, neck supple.  Eyes: EOMI.  Abdomen: non-tender to palpation and soft, normoactive bowel sounds. Musculoskeletal:  Bilateral Hip Exam:   ROM: Normal without discomfort.   Right Knee Exam:   No effusion.   Range of motion is 0-115 degrees.   Marked crepitus on range of motion of the knee.   Positive medial joint line tenderness.   No lateral joint line tenderness.   Stable knee.   Left Knee Exam:   No effusion.   Range of motion is 0-120 degrees.   Moderate crepitus on range of motion of the knee.   Positive medial greater than lateral joint line tenderness.   Stable knee.  Calves soft and nontender. Motor function intact in LE. Strength 5/5 LE bilaterally. Neuro: Distal pulses 2+. Sensation to light touch intact in LE.  Vital signs in last 24 hours: BP: ()/()  Arterial Line BP: ()/()   Imaging Review Plain radiographs demonstrate severe degenerative joint disease of the right knee. The overall alignment is neutral. The bone quality appears to be adequate for age and reported activity level.  Assessment/Plan:  End stage arthritis, right knee   The patient history, physical examination, clinical judgment of the provider and imaging studies are consistent with end stage degenerative joint disease of the right knee and total knee arthroplasty is deemed medically necessary. The treatment options including medical management, injection therapy arthroscopy and arthroplasty were discussed at length. The risks and benefits of total knee arthroplasty were presented and reviewed. The risks due to aseptic loosening, infection, stiffness, patella tracking problems, thromboembolic complications and other imponderables were discussed. The patient acknowledged the  explanation, agreed to proceed with the plan and consent was signed. Patient is being admitted for inpatient treatment for surgery, pain control, PT, OT, prophylactic antibiotics, VTE prophylaxis, progressive ambulation and ADLs and discharge planning. The patient is planning to be discharged  home .  Patient's anticipated LOS is less than 2 midnights, meeting these requirements: - Lives within 1 hour of care - Has a competent adult at home to recover with post-op - NO history of  - Chronic pain requiring opiods  -  Diabetes  - Coronary Artery Disease  - Heart failure  - Heart attack  - Stroke  - DVT/VTE  - Cardiac arrhythmia  - Respiratory Failure/COPD  - Renal failure  - Anemia  - Advanced Liver disease  Therapy Plans: EO in  Disposition: Home with Daughter Planned DVT Prophylaxis: Eliquis (hx of a-fib) DME Needed: RW PCP: Crist Infante, MD (clearance received) Cardiologist: Kela Millin, MD (clearance received) TXA: IV Allergies: codeine, penicillin G, levaquin Anesthesia Concerns: None BMI: 32.5 Last HgbA1c: not diabetic  Pharmacy: [facility] [place])  Other: -Per cardiologist, stop Eliquis 2 days prior to surgery -has not tolerated pain medication in the past - hydromorphone, oxycodone, hydrocodone have all caused hallucinations - will discuss with Dr. Wynelle Link at appt 6/9  - Patient was instructed on what medications to stop prior to surgery. - Follow-up visit in 2 weeks with Dr. Wynelle Link - Begin physical therapy following surgery - Pre-operative lab work as pre-surgical testing - Prescriptions will be provided in hospital at time of discharge  R. Jaynie Bream, PA-C Orthopedic Surgery [facility] [place]

## 2022-03-27 ENCOUNTER — Other Ambulatory Visit: Payer: Self-pay

## 2022-03-27 ENCOUNTER — Encounter (HOSPITAL_COMMUNITY): Payer: Self-pay

## 2022-03-27 ENCOUNTER — Encounter (HOSPITAL_COMMUNITY)
Admission: RE | Admit: 2022-03-27 | Discharge: 2022-03-27 | Disposition: A | Discharge status: Home / Self Care | Payer: Medicare PPO | Source: Ambulatory Visit | Attending: Orthopedic Surgery | Admitting: Orthopedic Surgery

## 2022-03-27 VITALS — BP 120/70 | HR 76 | Temp 98.7°F | Resp 18 | Ht 63.0 in | Wt 161.0 lb

## 2022-03-27 DIAGNOSIS — Z01818 Encounter for other preprocedural examination: Secondary | ICD-10-CM

## 2022-03-27 DIAGNOSIS — Z01812 Encounter for preprocedural laboratory examination: Secondary | ICD-10-CM | POA: Diagnosis not present

## 2022-03-27 DIAGNOSIS — I10 Essential (primary) hypertension: Secondary | ICD-10-CM | POA: Insufficient documentation

## 2022-03-27 HISTORY — DX: Cardiac arrhythmia, unspecified: I49.9

## 2022-03-27 LAB — CBC
HCT: 45.7 % (ref 36.0–46.0)
Hemoglobin: 14.2 g/dL (ref 12.0–15.0)
MCH: 31.6 pg (ref 26.0–34.0)
MCHC: 31.1 g/dL (ref 30.0–36.0)
MCV: 101.8 fL — ABNORMAL HIGH (ref 80.0–100.0)
Platelets: 241 10*3/uL (ref 150–400)
RBC: 4.49 MIL/uL (ref 3.87–5.11)
RDW: 14.5 % (ref 11.5–15.5)
WBC: 6.4 10*3/uL (ref 4.0–10.5)
nRBC: 0 % (ref 0.0–0.2)

## 2022-03-27 LAB — BASIC METABOLIC PANEL
Anion gap: 10 (ref 5–15)
BUN: 28 mg/dL — ABNORMAL HIGH (ref 8–23)
CO2: 27 mmol/L (ref 22–32)
Calcium: 10.6 mg/dL — ABNORMAL HIGH (ref 8.9–10.3)
Chloride: 105 mmol/L (ref 98–111)
Creatinine, Ser: 1.08 mg/dL — ABNORMAL HIGH (ref 0.44–1.00)
GFR, Estimated: 50 mL/min — ABNORMAL LOW (ref >60)
Glucose, Bld: 88 mg/dL (ref 70–99)
Potassium: 3.5 mmol/L (ref 3.5–5.1)
Sodium: 142 mmol/L (ref 135–145)

## 2022-03-27 LAB — SURGICAL PCR SCREEN
MRSA, PCR: NEGATIVE
Staphylococcus aureus: NEGATIVE

## 2022-03-27 NOTE — Progress Notes (Signed)
Anesthesia note:  Bowel prep reminder:  PCP - Dr. Jerilynn Mages. [place] Cardiologist -Dr. Christen Butter Other-   Chest x-ray -  EKG - 11/04/21-epic Stress Test - 2018-epic ECHO - 08/28/21-epic Cardiac Cath - no  Pacemaker/ICD device last checked:NA  Sleep Study - no CPAP -   Pt is pre diabetic-NA Fasting Blood Sugar -  Checks Blood Sugar _____  Blood Thinner:Eliquis for A-fib/ Dr. Einar Gip Blood Thinner Instructions:Stop 2 days prior I told Pt to call Dr. To okay stopping it 3 days prior to DOS Aspirin Instructions: Last Dose:04/03/22  Anesthesia review: yes  Patient denies shortness of breath, fever, cough and chest pain at PAT appointment Pt gets SOB with exertion. She sometimes gets SOB doing housework and getting ready in the morning. This is her usual since having a-fib.  Patient verbalized understanding of instructions that were given to them at the PAT appointment. Patient was also instructed that they will need to review over the PAT instructions again at home before surgery. yes

## 2022-03-28 DIAGNOSIS — M1711 Unilateral primary osteoarthritis, right knee: Secondary | ICD-10-CM | POA: Diagnosis not present

## 2022-03-31 NOTE — Progress Notes (Signed)
Anesthesia Chart Review   Case: [id number] Date/Time: 04/07/22 0805   Procedure: TOTAL KNEE ARTHROPLASTY (Right: Knee)   Anesthesia type: Choice   Pre-op diagnosis: right knee osteoarthritis   Location: [facility] / [facility]   Surgeons: Gaynelle Arabian, MD       DISCUSSION:87 y.o. never smoker with h/o HTN, stroke, atrial fibrillation, moderate-severe MR and TR with mild pulmonary HTN, right knee OA scheduled for above procedure 04/07/2022 with Dr. Gaynelle Arabian.   Pt last seen by cardiology 11/04/2021.  Pt last seen by cardiology 11/04/2021. Per OV note valvular disease has remained stable, mild chronic dyspnea unchanged.  1 year follow up recommended.   Pt with LLE chronic venous insufficiency with swelling.  Last seen by vascular surgery 02/06/22. Per OV note duplex with no signs of DVT, not a candidate for any further interventions.  Per note, "She is okay from a vascular standpoint for any knee replacement or Orthopedic surgery".   Pt reported at PAT visit she was advised to hold Eliquis 2 days prior to procedure, PAT nurse advised pt to call Dr. Einar Gip for clearance to hold for 3 days instead.   Anticipate pt can proceed with planned procedure barring acute status change.   VS: BP 120/70   Pulse 76   Temp 37.1 C (Oral)   Resp 18   Ht '5\' 3"'$  (1.6 m)   Wt 73 kg   SpO2 96%   BMI 28.52 kg/m   PROVIDERS: Crist Infante, MD is PCP   Adrian Prows, MD is Cardiologist  LABS: Labs reviewed: Acceptable for surgery. (all labs ordered are listed, but only abnormal results are displayed)  Labs Reviewed  BASIC METABOLIC PANEL - Abnormal; Notable for the following components:      Result Value   BUN 28 (*)    Creatinine, Ser 1.08 (*)    Calcium 10.6 (*)    GFR, Estimated 50 (*)    All other components within normal limits  CBC - Abnormal; Notable for the following components:   MCV 101.8 (*)    All other components within normal limits  SURGICAL PCR SCREEN     IMAGES: VAS [place] Lower  Extremity 02/06/2022 Summary:  Left:  - No evidence of deep vein thrombosis seen in the left lower extremity,  from the common femoral through the popliteal veins.  - No evidence of superficial venous thrombosis in the left lower  extremity.  - Deep vein reflux in the CFV and proximal FV.  - Superficial veinr reflux in the SFJ and proximal GSV.   EKG: 11/04/2021 EKG 11/04/2021: Atrial fibrillation with controlled ventricular response at rate of 84 bpm, normal axis, poor R wave progression, probably normal variant.  Nonspecific T abnormality, cannot exclude inferior ischemia.  No significant change from 04/19/2021.  CV: Echocardiogram 08/28/2021:  Left ventricle cavity is normal in size and wall thickness. Normal global  wall motion. Normal LV systolic function with visual EF 55-60%. Unable to  evaluate diastolic function due to atrial fibrillation.  Left atrial cavity is severely dilated.  Right atrial cavity is moderately dilated.  Structurally normal trileaflet aortic valve. No evidence of aortic  stenosis. Mild (Grade I) aortic regurgitation.  Moderate mitral valve leaflet calcification. No evidence of mitral  stenosis. Moderate to severe mitral regurgitation.  Severe tricuspid regurgitation.  No evidence of pulmonary hypertension.  No significant change compared to previous study on 10/30/2020. 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   Urinary frequency     Past Surgical History:  Procedure Laterality Date   [place] Left 05/07/2016   BREAST BIOPSY Bilateral 1956, 1980   BREAST LUMPECTOMY WITH RADIOACTIVE SEED LOCALIZATION Left  06/16/2016   Procedure: LEFT BREAST LUMPECTOMY WITH RADIOACTIVE SEED LOCALIZATION;  Surgeon: Autumn Messing III, MD;  Location: [facility];  Service: General;  Laterality: Left;   COLONOSCOPY     KIDNEY STONE SURGERY     KNEE SURGERY  11/20/2004   WRIST SURGERY Left 2018    MEDICATIONS:  acetaminophen (TYLENOL) 500 MG tablet   allopurinol (ZYLOPRIM) 100 MG tablet   atenolol (TENORMIN) 50 MG tablet   Biotin (BIOTIN 5000) 5 MG CAPS   Cholecalciferol 50 MCG (2000 UT) TABS   diclofenac Sodium (VOLTAREN) 1 % GEL   ELIQUIS 5 MG TABS tablet   furosemide (LASIX) 20 MG tablet   loratadine (CLARITIN) 10 MG tablet   rosuvastatin (CRESTOR) 20 MG tablet   triamcinolone cream (KENALOG) 0.1 %   triamterene-hydrochlorothiazide (MAXZIDE-25) 37.5-25 MG tablet   venlafaxine XR (EFFEXOR-XR) 37.5 MG 24 hr capsule   vitamin B-12 (CYANOCOBALAMIN) 500 MCG tablet   vitamin E 180 MG (400 UNITS) capsule   No current facility-administered medications for this encounter.    Konrad Felix Ward, PA-C [facility] Pre-Surgical Testing [phone]

## 2022-03-31 NOTE — Anesthesia Preprocedure Evaluation (Addendum)
Anesthesia Evaluation  Patient identified by MRN, date of birth, ID band Patient awake    Reviewed: Allergy & Precautions, NPO status , Patient's Chart, lab work & pertinent test results, reviewed documented beta blocker date and time   Airway Mallampati: III  TM Distance: >3 FB Neck ROM: Full    Dental no notable dental hx. (+) Teeth Intact, Dental Advisory Given   Pulmonary neg pulmonary ROS,    Pulmonary exam normal breath sounds clear to auscultation       Cardiovascular hypertension (166/84 in preop, per pt normally 120s SBP), Pt. on medications and Pt. on home beta blockers + Peripheral Vascular Disease  Normal cardiovascular exam+ dysrhythmias (eliquis last dose 6/14) Atrial Fibrillation + Valvular Problems/Murmurs (mod-severe MR) MR  Rhythm:Regular Rate:Normal  Last echo 2022 Echocardiogram 08/28/2021:  Left ventricle cavity is normal in size and wall thickness. Normal global  wall motion. Normal LV systolic function with visual EF 55-60%. Unable to  evaluate diastolic function due to atrial fibrillation.  Left atrial cavity is severely dilated.  Right atrial cavity is moderately dilated.  Structurally normal trileaflet aortic valve. No evidence of aortic  stenosis. Mild (Grade I) aortic regurgitation.  Moderate mitral valve leaflet calcification. No evidence of mitral  stenosis. Moderate to severe mitral regurgitation.  Severe tricuspid regurgitation.  No evidence of pulmonary hypertension.  No significant change compared to previous study on 10/30/2020.  Pt last seen by cardiology 11/04/2021.  Pt last seen by cardiology 11/04/2021. Per OV note valvular disease has remained stable, mild chronic dyspnea unchanged.  1 year follow up recommended.   Chronic LE edema - L>R   Neuro/Psych PSYCHIATRIC DISORDERS Anxiety Depression CVA (R ear deafness, remote past ), Residual Symptoms    GI/Hepatic negative GI ROS, Neg liver ROS,    Endo/Other  negative endocrine ROS  Renal/GU Renal InsufficiencyRenal diseaseCr 1.08  negative genitourinary   Musculoskeletal  (+) Arthritis , Osteoarthritis,  Fibromyalgia -Hb 14.2, plt 241   Abdominal   Peds  Hematology negative hematology ROS (+)   Anesthesia Other Findings   Reproductive/Obstetrics negative OB ROS                           Anesthesia Physical Anesthesia Plan  ASA: 4  Anesthesia Plan: Spinal, Regional and MAC   Post-op Pain Management:    Induction:   PONV Risk Score and Plan: 2 and Propofol infusion and TIVA  Airway Management Planned: Natural Airway and Nasal Cannula  Additional Equipment: Arterial line  Intra-op Plan:   Post-operative Plan:   Informed Consent: I have reviewed the patients History and Physical, chart, labs and discussed the procedure including the risks, benefits and alternatives for the proposed anesthesia with the patient or authorized representative who has indicated his/her understanding and acceptance.     Dental advisory given  Plan Discussed with: CRNA  Anesthesia Plan Comments:       Anesthesia Quick Evaluation

## 2022-04-04 DIAGNOSIS — I872 Venous insufficiency (chronic) (peripheral): Secondary | ICD-10-CM | POA: Diagnosis not present

## 2022-04-04 DIAGNOSIS — I83893 Varicose veins of bilateral lower extremities with other complications: Secondary | ICD-10-CM | POA: Diagnosis not present

## 2022-04-04 DIAGNOSIS — I480 Paroxysmal atrial fibrillation: Secondary | ICD-10-CM | POA: Diagnosis not present

## 2022-04-04 DIAGNOSIS — I10 Essential (primary) hypertension: Secondary | ICD-10-CM | POA: Diagnosis not present

## 2022-04-07 ENCOUNTER — Observation Stay (HOSPITAL_COMMUNITY)
Admission: RE | Admit: 2022-04-07 | Discharge: 2022-04-08 | Disposition: A | Discharge status: Home / Self Care | Payer: Medicare PPO | Source: Ambulatory Visit | Attending: Orthopedic Surgery | Admitting: Orthopedic Surgery

## 2022-04-07 ENCOUNTER — Ambulatory Visit (HOSPITAL_COMMUNITY): Payer: Medicare PPO | Admitting: Physician Assistant

## 2022-04-07 ENCOUNTER — Encounter (HOSPITAL_COMMUNITY): Payer: Self-pay | Admitting: Orthopedic Surgery

## 2022-04-07 ENCOUNTER — Other Ambulatory Visit: Payer: Self-pay

## 2022-04-07 ENCOUNTER — Ambulatory Visit (HOSPITAL_BASED_OUTPATIENT_CLINIC_OR_DEPARTMENT_OTHER): Payer: Medicare PPO | Admitting: Anesthesiology

## 2022-04-07 ENCOUNTER — Encounter (HOSPITAL_COMMUNITY)
Admission: RE | Disposition: A | Discharge status: Home / Self Care | Payer: Self-pay | Source: Ambulatory Visit | Attending: Orthopedic Surgery

## 2022-04-07 DIAGNOSIS — M1711 Unilateral primary osteoarthritis, right knee: Principal | ICD-10-CM

## 2022-04-07 DIAGNOSIS — N1831 Chronic kidney disease, stage 3a: Secondary | ICD-10-CM | POA: Diagnosis not present

## 2022-04-07 DIAGNOSIS — Z853 Personal history of malignant neoplasm of breast: Secondary | ICD-10-CM | POA: Diagnosis not present

## 2022-04-07 DIAGNOSIS — G8918 Other acute postprocedural pain: Secondary | ICD-10-CM | POA: Diagnosis not present

## 2022-04-07 DIAGNOSIS — I129 Hypertensive chronic kidney disease with stage 1 through stage 4 chronic kidney disease, or unspecified chronic kidney disease: Secondary | ICD-10-CM | POA: Diagnosis not present

## 2022-04-07 DIAGNOSIS — Z79899 Other long term (current) drug therapy: Secondary | ICD-10-CM | POA: Diagnosis not present

## 2022-04-07 DIAGNOSIS — I4891 Unspecified atrial fibrillation: Secondary | ICD-10-CM | POA: Diagnosis not present

## 2022-04-07 DIAGNOSIS — N289 Disorder of kidney and ureter, unspecified: Secondary | ICD-10-CM | POA: Diagnosis not present

## 2022-04-07 DIAGNOSIS — Z8673 Personal history of transient ischemic attack (TIA), and cerebral infarction without residual deficits: Secondary | ICD-10-CM | POA: Insufficient documentation

## 2022-04-07 DIAGNOSIS — F418 Other specified anxiety disorders: Secondary | ICD-10-CM

## 2022-04-07 DIAGNOSIS — I10 Essential (primary) hypertension: Secondary | ICD-10-CM | POA: Diagnosis not present

## 2022-04-07 DIAGNOSIS — Z01818 Encounter for other preprocedural examination: Secondary | ICD-10-CM

## 2022-04-07 HISTORY — PX: TOTAL KNEE ARTHROPLASTY: SHX125

## 2022-04-07 SURGERY — ARTHROPLASTY, KNEE, TOTAL
Anesthesia: Monitor Anesthesia Care | Site: Knee | Laterality: Right

## 2022-04-07 MED ORDER — LACTATED RINGERS IV SOLN: INTRAVENOUS | Status: DC

## 2022-04-07 MED ORDER — PROPOFOL 10 MG/ML IV BOLUS
INTRAVENOUS | Status: DC | PRN
  Administered 2022-04-07: 20 mg via INTRAVENOUS

## 2022-04-07 MED ORDER — METOCLOPRAMIDE HCL 5 MG PO TABS: 5.0000 mg | ORAL_TABLET | Freq: Three times a day (TID) | ORAL | Status: DC | PRN

## 2022-04-07 MED ORDER — ONDANSETRON HCL 4 MG/2ML IJ SOLN
INTRAMUSCULAR | Status: AC
  Filled 2022-04-07: qty 2

## 2022-04-07 MED ORDER — BUPIVACAINE LIPOSOME 1.3 % IJ SUSP: 20.0000 mL | Freq: Once | INTRAMUSCULAR | Status: DC

## 2022-04-07 MED ORDER — BISACODYL 10 MG RE SUPP: 10.0000 mg | Freq: Every day | RECTAL | Status: DC | PRN

## 2022-04-07 MED ORDER — DIPHENHYDRAMINE HCL 12.5 MG/5ML PO ELIX: 12.5000 mg | ORAL_SOLUTION | ORAL | Status: DC | PRN

## 2022-04-07 MED ORDER — METHOCARBAMOL 500 MG IVPB - SIMPLE MED: 500.0000 mg | Freq: Four times a day (QID) | INTRAVENOUS | Status: DC | PRN

## 2022-04-07 MED ORDER — LIDOCAINE HCL (PF) 2 % IJ SOLN
INTRAMUSCULAR | Status: AC
  Filled 2022-04-07: qty 5

## 2022-04-07 MED ORDER — POLYETHYLENE GLYCOL 3350 17 G PO PACK: 17.0000 g | PACK | Freq: Every day | ORAL | Status: DC | PRN

## 2022-04-07 MED ORDER — PHENYLEPHRINE 80 MCG/ML (10ML) SYRINGE FOR IV PUSH (FOR BLOOD PRESSURE SUPPORT)
PREFILLED_SYRINGE | INTRAVENOUS | Status: DC | PRN
  Administered 2022-04-07 (×2): 80 ug via INTRAVENOUS

## 2022-04-07 MED ORDER — DOCUSATE SODIUM 100 MG PO CAPS
100.0000 mg | ORAL_CAPSULE | Freq: Two times a day (BID) | ORAL | Status: DC
  Administered 2022-04-07 – 2022-04-08 (×3): 100 mg via ORAL
  Filled 2022-04-07 (×3): qty 1

## 2022-04-07 MED ORDER — SODIUM CHLORIDE (PF) 0.9 % IJ SOLN
INTRAMUSCULAR | Status: DC | PRN
  Administered 2022-04-07: 60 mL via INTRAVENOUS

## 2022-04-07 MED ORDER — SODIUM CHLORIDE (PF) 0.9 % IJ SOLN
INTRAMUSCULAR | Status: AC
  Filled 2022-04-07: qty 50

## 2022-04-07 MED ORDER — OXYCODONE HCL 5 MG/5ML PO SOLN: 5.0000 mg | Freq: Once | ORAL | Status: DC | PRN

## 2022-04-07 MED ORDER — FENTANYL CITRATE PF 50 MCG/ML IJ SOSY
50.0000 ug | PREFILLED_SYRINGE | INTRAMUSCULAR | Status: DC
  Administered 2022-04-07: 50 ug via INTRAVENOUS
  Filled 2022-04-07: qty 2

## 2022-04-07 MED ORDER — DEXAMETHASONE SODIUM PHOSPHATE 10 MG/ML IJ SOLN
10.0000 mg | Freq: Once | INTRAMUSCULAR | Status: AC
  Administered 2022-04-08: 10 mg via INTRAVENOUS
  Filled 2022-04-07: qty 1

## 2022-04-07 MED ORDER — APIXABAN 2.5 MG PO TABS
2.5000 mg | ORAL_TABLET | Freq: Two times a day (BID) | ORAL | Status: DC
  Administered 2022-04-08: 2.5 mg via ORAL
  Filled 2022-04-07: qty 1

## 2022-04-07 MED ORDER — BUPIVACAINE IN DEXTROSE 0.75-8.25 % IT SOLN
INTRATHECAL | Status: DC | PRN
  Administered 2022-04-07: 1.6 mL via INTRATHECAL

## 2022-04-07 MED ORDER — ACETAMINOPHEN 10 MG/ML IV SOLN
1000.0000 mg | Freq: Four times a day (QID) | INTRAVENOUS | Status: DC
  Administered 2022-04-07: 1000 mg via INTRAVENOUS
  Filled 2022-04-07: qty 100

## 2022-04-07 MED ORDER — METHOCARBAMOL 500 MG PO TABS
500.0000 mg | ORAL_TABLET | Freq: Four times a day (QID) | ORAL | Status: DC | PRN
  Administered 2022-04-08 (×2): 500 mg via ORAL
  Filled 2022-04-07 (×2): qty 1

## 2022-04-07 MED ORDER — ONDANSETRON HCL 4 MG PO TABS: 4.0000 mg | ORAL_TABLET | Freq: Four times a day (QID) | ORAL | Status: DC | PRN

## 2022-04-07 MED ORDER — METOCLOPRAMIDE HCL 5 MG/ML IJ SOLN: 5.0000 mg | Freq: Three times a day (TID) | INTRAMUSCULAR | Status: DC | PRN

## 2022-04-07 MED ORDER — PHENYLEPHRINE 80 MCG/ML (10ML) SYRINGE FOR IV PUSH (FOR BLOOD PRESSURE SUPPORT)
PREFILLED_SYRINGE | INTRAVENOUS | Status: AC
  Filled 2022-04-07: qty 10

## 2022-04-07 MED ORDER — POVIDONE-IODINE 10 % EX SWAB
1.0000 | Freq: Once | CUTANEOUS | Status: AC
  Administered 2022-04-07: 1 via TOPICAL

## 2022-04-07 MED ORDER — ACETAMINOPHEN 500 MG PO TABS: 1000.0000 mg | ORAL_TABLET | Freq: Once | ORAL | Status: AC

## 2022-04-07 MED ORDER — FLEET ENEMA 7-19 GM/118ML RE ENEM: 1.0000 | ENEMA | Freq: Once | RECTAL | Status: DC | PRN

## 2022-04-07 MED ORDER — MORPHINE SULFATE (PF) 2 MG/ML IV SOLN: 0.5000 mg | INTRAVENOUS | Status: DC | PRN

## 2022-04-07 MED ORDER — SODIUM CHLORIDE (PF) 0.9 % IJ SOLN
INTRAMUSCULAR | Status: AC
  Filled 2022-04-07: qty 10

## 2022-04-07 MED ORDER — BUPIVACAINE LIPOSOME 1.3 % IJ SUSP
INTRAMUSCULAR | Status: AC
  Filled 2022-04-07: qty 20

## 2022-04-07 MED ORDER — MENTHOL 3 MG MT LOZG: 1.0000 | LOZENGE | OROMUCOSAL | Status: DC | PRN

## 2022-04-07 MED ORDER — OXYCODONE HCL 5 MG PO TABS: 5.0000 mg | ORAL_TABLET | Freq: Once | ORAL | Status: DC | PRN

## 2022-04-07 MED ORDER — VENLAFAXINE HCL ER 37.5 MG PO CP24
37.5000 mg | ORAL_CAPSULE | Freq: Every day | ORAL | Status: DC
  Administered 2022-04-08: 37.5 mg via ORAL
  Filled 2022-04-07: qty 1

## 2022-04-07 MED ORDER — VANCOMYCIN HCL IN DEXTROSE 1-5 GM/200ML-% IV SOLN
1000.0000 mg | Freq: Two times a day (BID) | INTRAVENOUS | Status: AC
  Administered 2022-04-07: 1000 mg via INTRAVENOUS
  Filled 2022-04-07: qty 200

## 2022-04-07 MED ORDER — PROPOFOL 500 MG/50ML IV EMUL
INTRAVENOUS | Status: DC | PRN
  Administered 2022-04-07: 50 ug/kg/min via INTRAVENOUS

## 2022-04-07 MED ORDER — SODIUM CHLORIDE 0.9 % IV SOLN: INTRAVENOUS | Status: DC

## 2022-04-07 MED ORDER — ROPIVACAINE HCL 5 MG/ML IJ SOLN
INTRAMUSCULAR | Status: DC | PRN
  Administered 2022-04-07: 30 mL via PERINEURAL

## 2022-04-07 MED ORDER — TRIAMTERENE-HCTZ 37.5-25 MG PO TABS
1.0000 | ORAL_TABLET | Freq: Every day | ORAL | Status: DC
  Administered 2022-04-08: 1 via ORAL
  Filled 2022-04-07: qty 1

## 2022-04-07 MED ORDER — EPHEDRINE 5 MG/ML INJ
INTRAVENOUS | Status: AC
  Filled 2022-04-07: qty 5

## 2022-04-07 MED ORDER — FENTANYL CITRATE PF 50 MCG/ML IJ SOSY: 25.0000 ug | PREFILLED_SYRINGE | INTRAMUSCULAR | Status: DC | PRN

## 2022-04-07 MED ORDER — ONDANSETRON HCL 4 MG/2ML IJ SOLN: 4.0000 mg | Freq: Four times a day (QID) | INTRAMUSCULAR | Status: DC | PRN

## 2022-04-07 MED ORDER — LORATADINE 10 MG PO TABS
10.0000 mg | ORAL_TABLET | Freq: Every day | ORAL | Status: DC
  Administered 2022-04-08: 10 mg via ORAL
  Filled 2022-04-07: qty 1

## 2022-04-07 MED ORDER — ONDANSETRON HCL 4 MG/2ML IJ SOLN
INTRAMUSCULAR | Status: DC | PRN
  Administered 2022-04-07: 4 mg via INTRAVENOUS

## 2022-04-07 MED ORDER — DEXAMETHASONE SODIUM PHOSPHATE 10 MG/ML IJ SOLN
8.0000 mg | Freq: Once | INTRAMUSCULAR | Status: AC
  Administered 2022-04-07: 10 mg via INTRAVENOUS

## 2022-04-07 MED ORDER — ATENOLOL 50 MG PO TABS
50.0000 mg | ORAL_TABLET | Freq: Two times a day (BID) | ORAL | Status: DC
  Administered 2022-04-07 – 2022-04-08 (×2): 50 mg via ORAL
  Filled 2022-04-07 (×2): qty 1

## 2022-04-07 MED ORDER — TRANEXAMIC ACID-NACL 1000-0.7 MG/100ML-% IV SOLN
1000.0000 mg | INTRAVENOUS | Status: AC
  Administered 2022-04-07: 1000 mg via INTRAVENOUS
  Filled 2022-04-07: qty 100

## 2022-04-07 MED ORDER — ONDANSETRON HCL 4 MG/2ML IJ SOLN
4.0000 mg | Freq: Once | INTRAMUSCULAR | Status: DC | PRN
Start: 2022-04-07 — End: 2022-04-07

## 2022-04-07 MED ORDER — PHENYLEPHRINE HCL-NACL 20-0.9 MG/250ML-% IV SOLN
INTRAVENOUS | Status: DC | PRN
  Administered 2022-04-07: 35 ug/min via INTRAVENOUS

## 2022-04-07 MED ORDER — BUPIVACAINE LIPOSOME 1.3 % IJ SUSP
INTRAMUSCULAR | Status: DC | PRN
  Administered 2022-04-07: 20 mL

## 2022-04-07 MED ORDER — DEXAMETHASONE SODIUM PHOSPHATE 10 MG/ML IJ SOLN
INTRAMUSCULAR | Status: AC
  Filled 2022-04-07: qty 1

## 2022-04-07 MED ORDER — ORAL CARE MOUTH RINSE: 15.0000 mL | Freq: Once | OROMUCOSAL | Status: AC

## 2022-04-07 MED ORDER — POVIDONE-IODINE 10 % EX SWAB: 2.0000 "application " | Freq: Once | CUTANEOUS | Status: DC

## 2022-04-07 MED ORDER — DEXAMETHASONE SODIUM PHOSPHATE 10 MG/ML IJ SOLN
INTRAMUSCULAR | Status: DC | PRN
  Administered 2022-04-07: 10 mg

## 2022-04-07 MED ORDER — TRAMADOL HCL 50 MG PO TABS
50.0000 mg | ORAL_TABLET | Freq: Four times a day (QID) | ORAL | Status: DC | PRN
  Administered 2022-04-07: 100 mg via ORAL
  Administered 2022-04-07: 50 mg via ORAL
  Administered 2022-04-08 (×2): 100 mg via ORAL
  Filled 2022-04-07: qty 1
  Filled 2022-04-07: qty 2
  Filled 2022-04-07: qty 1
  Filled 2022-04-07: qty 2
  Filled 2022-04-07: qty 1

## 2022-04-07 MED ORDER — ACETAMINOPHEN 500 MG PO TABS
1000.0000 mg | ORAL_TABLET | Freq: Four times a day (QID) | ORAL | Status: AC
  Administered 2022-04-07 – 2022-04-08 (×4): 1000 mg via ORAL
  Filled 2022-04-07 (×4): qty 2

## 2022-04-07 MED ORDER — PHENOL 1.4 % MT LIQD
1.0000 | OROMUCOSAL | Status: DC | PRN
Start: 2022-04-07 — End: 2022-04-08

## 2022-04-07 MED ORDER — CHLORHEXIDINE GLUCONATE 0.12 % MT SOLN
15.0000 mL | Freq: Once | OROMUCOSAL | Status: AC
  Administered 2022-04-07: 15 mL via OROMUCOSAL

## 2022-04-07 MED ORDER — VANCOMYCIN HCL IN DEXTROSE 1-5 GM/200ML-% IV SOLN
1000.0000 mg | INTRAVENOUS | Status: AC
  Administered 2022-04-07: 1000 mg via INTRAVENOUS
  Filled 2022-04-07: qty 200

## 2022-04-07 MED ORDER — PROPOFOL 1000 MG/100ML IV EMUL
INTRAVENOUS | Status: AC
Start: 2022-04-07 — End: ?
  Filled 2022-04-07: qty 100

## 2022-04-07 MED ORDER — SODIUM CHLORIDE 0.9 % IR SOLN
Status: DC | PRN
  Administered 2022-04-07: 1000 mL

## 2022-04-07 MED ORDER — ALLOPURINOL 100 MG PO TABS
100.0000 mg | ORAL_TABLET | Freq: Two times a day (BID) | ORAL | Status: DC
  Administered 2022-04-07 – 2022-04-08 (×2): 100 mg via ORAL
  Filled 2022-04-07 (×2): qty 1

## 2022-04-07 SURGICAL SUPPLY — 56 items
BAG COUNTER SPONGE SURGICOUNT (BAG) ×1 IMPLANT
BAG ZIPLOCK 12X15 (MISCELLANEOUS) ×2 IMPLANT
BLADE SAG 18X100X1.27 (BLADE) ×2 IMPLANT
BLADE SAW SGTL 11.0X1.19X90.0M (BLADE) ×2 IMPLANT
BNDG ELASTIC 6X5.8 VLCR STR LF (GAUZE/BANDAGES/DRESSINGS) ×2 IMPLANT
BOWL SMART MIX CTS (DISPOSABLE) ×2 IMPLANT
CEMENT HV SMART SET (Cement) ×4 IMPLANT
CEMENT TIBIA MBT (Knees) IMPLANT
CLSR STERI-STRIP ANTIMIC 1/2X4 (GAUZE/BANDAGES/DRESSINGS) ×1 IMPLANT
COVER SURGICAL LIGHT HANDLE (MISCELLANEOUS) ×2 IMPLANT
CUFF TOURN SGL QUICK 34 (TOURNIQUET CUFF) ×1
CUFF TRNQT CYL 34X4.125X (TOURNIQUET CUFF) ×1 IMPLANT
DRAPE INCISE IOBAN 66X45 STRL (DRAPES) ×2 IMPLANT
DRAPE U-SHAPE 47X51 STRL (DRAPES) ×2 IMPLANT
DRSG AQUACEL AG ADV 3.5X10 (GAUZE/BANDAGES/DRESSINGS) ×2 IMPLANT
DURAPREP 26ML APPLICATOR (WOUND CARE) ×2 IMPLANT
ELECT REM PT RETURN 15FT ADLT (MISCELLANEOUS) ×2 IMPLANT
FEMUR SIGMA PS SZ 3.0 R (Femur) ×1 IMPLANT
GLOVE BIO SURGEON STRL SZ 6.5 (GLOVE) IMPLANT
GLOVE BIO SURGEON STRL SZ7.5 (GLOVE) IMPLANT
GLOVE BIO SURGEON STRL SZ8 (GLOVE) ×2 IMPLANT
GLOVE BIOGEL PI IND STRL 6.5 (GLOVE) IMPLANT
GLOVE BIOGEL PI IND STRL 7.0 (GLOVE) IMPLANT
GLOVE BIOGEL PI IND STRL 8 (GLOVE) ×1 IMPLANT
GLOVE BIOGEL PI INDICATOR 6.5 (GLOVE)
GLOVE BIOGEL PI INDICATOR 7.0 (GLOVE)
GLOVE BIOGEL PI INDICATOR 8 (GLOVE) ×1
GOWN STRL REUS W/ TWL LRG LVL3 (GOWN DISPOSABLE) ×1 IMPLANT
GOWN STRL REUS W/ TWL XL LVL3 (GOWN DISPOSABLE) IMPLANT
GOWN STRL REUS W/TWL LRG LVL3 (GOWN DISPOSABLE) ×1
GOWN STRL REUS W/TWL XL LVL3 (GOWN DISPOSABLE)
HANDPIECE INTERPULSE COAX TIP (DISPOSABLE) ×1
HOLDER FOLEY CATH W/STRAP (MISCELLANEOUS) ×1 IMPLANT
IMMOBILIZER KNEE 20 (SOFTGOODS) ×2
IMMOBILIZER KNEE 20 THIGH 36 (SOFTGOODS) ×1 IMPLANT
KIT TURNOVER KIT A (KITS) ×1 IMPLANT
MANIFOLD NEPTUNE II (INSTRUMENTS) ×2 IMPLANT
NS IRRIG 1000ML POUR BTL (IV SOLUTION) ×2 IMPLANT
PACK TOTAL KNEE CUSTOM (KITS) ×2 IMPLANT
PADDING CAST COTTON 6X4 STRL (CAST SUPPLIES) ×3 IMPLANT
PATELLA DOME PFC 35MM (Knees) ×1 IMPLANT
PLATE ROT INSERT 12.5MM SIZE 3 (Plate) ×1 IMPLANT
PROTECTOR NERVE ULNAR (MISCELLANEOUS) ×2 IMPLANT
SET HNDPC FAN SPRY TIP SCT (DISPOSABLE) ×1 IMPLANT
SPIKE FLUID TRANSFER (MISCELLANEOUS) ×2 IMPLANT
STRIP CLOSURE SKIN 1/2X4 (GAUZE/BANDAGES/DRESSINGS) ×4 IMPLANT
SUT MNCRL AB 4-0 PS2 18 (SUTURE) ×2 IMPLANT
SUT STRATAFIX 0 PDS 27 VIOLET (SUTURE) ×4
SUT VIC AB 2-0 CT1 27 (SUTURE) ×3
SUT VIC AB 2-0 CT1 TAPERPNT 27 (SUTURE) ×3 IMPLANT
SUTURE STRATFX 0 PDS 27 VIOLET (SUTURE) ×1 IMPLANT
TIBIA MBT CEMENT (Knees) ×2 IMPLANT
TRAY FOLEY MTR SLVR 16FR STAT (SET/KITS/TRAYS/PACK) ×2 IMPLANT
TUBE SUCTION HIGH CAP CLEAR NV (SUCTIONS) ×2 IMPLANT
WATER STERILE IRR 1000ML POUR (IV SOLUTION) ×4 IMPLANT
WRAP KNEE MAXI GEL POST OP (GAUZE/BANDAGES/DRESSINGS) ×2 IMPLANT

## 2022-04-07 NOTE — Plan of Care (Signed)
  Problem: Education: Goal: Knowledge of General Education information will improve Description: Including pain rating scale, medication(s)/side effects and non-pharmacologic comfort measures Outcome: Progressing   Problem: Clinical Measurements: Goal: Respiratory complications will improve Outcome: Progressing   Problem: Activity: Goal: Risk for activity intolerance will decrease Outcome: Progressing   Problem: Nutrition: Goal: Adequate nutrition will be maintained Outcome: Progressing   Problem: Elimination: Goal: Will not experience complications related to bowel motility Outcome: Progressing   Problem: Pain Managment: Goal: General experience of comfort will improve Outcome: Progressing   Problem: Education: Goal: Knowledge of General Education information will improve Description: Including pain rating scale, medication(s)/side effects and non-pharmacologic comfort measures Outcome: Progressing   Problem: Pain Managment: Goal: General experience of comfort will improve Outcome: Progressing   Problem: Education: Goal: Knowledge of the prescribed therapeutic regimen will improve Outcome: Progressing   Problem: Activity: Goal: Ability to avoid complications of mobility impairment will improve Outcome: Progressing   Problem: Pain Management: Goal: Pain level will decrease with appropriate interventions Outcome: Progressing

## 2022-04-07 NOTE — Evaluation (Signed)
Physical Therapy Evaluation Patient Details Name: Kathryn Ayala MRN: [id number] DOB: 1935-05-09 Today's Date: 04/07/2022  History of Present Illness  87 yo  female s/p R TKA on 04/07/22. PMH: HTN, fibromyalgia, CKD, CVA, sensorineural hearing loss  Clinical Impression  Pt is s/p TKA resulting in the deficits listed below (see PT Problem List).  Pt amb 22' with RW and  min assist, ltd by fatigue, pain. Assist needed with transfers d/t painful "good knee".  Pt is asking about HHPT  Pt will benefit from skilled PT to increase their independence and safety with mobility to allow discharge to the venue listed below.         Recommendations for follow up therapy are one component of a multi-disciplinary discharge planning process, led by the attending physician.  Recommendations may be updated based on patient status, additional functional criteria and insurance authorization.  Follow Up Recommendations Follow physician's recommendations for discharge plan and follow up therapies (pt requesting HHPT)    Assistance Recommended at Discharge Intermittent Supervision/Assistance  Patient can return home with the following  A little help with walking and/or transfers;Assist for transportation;Help with stairs or ramp for entrance;Assistance with cooking/housework    Equipment Recommendations Rolling walker (2 wheels)  Recommendations for Other Services       Functional Status Assessment Patient has had a recent decline in their functional status and demonstrates the ability to make significant improvements in function in a reasonable and predictable amount of time.     Precautions / Restrictions Precautions Precautions: Fall;Knee Restrictions Weight Bearing Restrictions: No Other Position/Activity Restrictions: WBAT      Mobility  Bed Mobility Overal bed mobility: Needs Assistance Bed Mobility: Supine to Sit     Supine to sit: Min assist     General bed mobility comments:  assist with RLE, incr time    Transfers Overall transfer level: Needs assistance Equipment used: Rolling walker (2 wheels) Transfers: Sit to/from Stand                  Ambulation/Gait Ambulation/Gait assistance: Min assist Gait Distance (Feet): 22 Feet Assistive device: Rolling walker (2 wheels) Gait Pattern/deviations: Step-to pattern, Decreased stance time - right Gait velocity: decr     General Gait Details: multi-modal cues for sequence, RW position. min assist to balance while wt shifting  Stairs            Wheelchair Mobility    Modified Rankin (Stroke Patients Only)       Balance Overall balance assessment: Mild deficits observed, not formally tested                                           Pertinent Vitals/Pain Pain Assessment Pain Assessment: 0-10 Pain Score: 5  Pain Location: right knee Pain Descriptors / Indicators: Aching, Grimacing, Sore, Operative site guarding Pain Intervention(s): Limited activity within patient's tolerance, Monitored during session, Premedicated before session, Repositioned    Home Living Family/patient expects to be discharged to:: Private residence Living Arrangements: Spouse/significant other Available Help at Discharge: Family Type of Home: House (one step to get into sunroom) Home Access: Ramped entrance (short ramp over one step)       Home Layout: One level Home Equipment: Grab bars - tub/shower;Grab bars - toilet      Prior Function Prior Level of Function : Independent/Modified Independent  Hand Dominance        Extremity/Trunk Assessment   Upper Extremity Assessment Upper Extremity Assessment: LUE deficits/detail LUE Deficits / Details: ankle grossly WFL, knee flexion limited to ~ 95 degrees, strenght 3+/5 (limited at baseline per pt d/t OA knee)    Lower Extremity Assessment Lower Extremity Assessment: RLE deficits/detail RLE Deficits / Details:  ankle WFL, knee extension and hip flexion 2+/5       Communication   Communication: No difficulties  Cognition Arousal/Alertness: Awake/alert Behavior During Therapy: WFL for tasks assessed/performed Overall Cognitive Status: Within Functional Limits for tasks assessed                                          General Comments      Exercises     Assessment/Plan    PT Assessment Patient needs continued PT services  PT Problem List Decreased strength;Decreased mobility;Decreased range of motion;Decreased activity tolerance;Decreased balance;Pain;Decreased knowledge of use of DME       PT Treatment Interventions DME instruction;Therapeutic exercise;Gait training;Functional mobility training;Therapeutic activities;Patient/family education    PT Goals (Current goals can be found in the Care Plan section)  Acute Rehab PT Goals Patient Stated Goal: have less knee pain PT Goal Formulation: With patient Time For Goal Achievement: 04/14/22 Potential to Achieve Goals: Good    Frequency 7X/week     Co-evaluation               AM-PAC PT "6 Clicks" Mobility  Outcome Measure Help needed turning from your back to your side while in a flat bed without using bedrails?: A Little Help needed moving from lying on your back to sitting on the side of a flat bed without using bedrails?: A Little Help needed moving to and from a bed to a chair (including a wheelchair)?: A Little Help needed standing up from a chair using your arms (e.g., wheelchair or bedside chair)?: A Little Help needed to walk in hospital room?: A Little Help needed climbing 3-5 steps with a railing? : A Lot 6 Click Score: 17    End of Session Equipment Utilized During Treatment: Gait belt Activity Tolerance: Patient tolerated treatment well Patient left: in chair;with call bell/phone within reach;with chair alarm set;with family/visitor present Nurse Communication: Mobility status PT Visit  Diagnosis: Other abnormalities of gait and mobility (R26.89);Difficulty in walking, not elsewhere classified (R26.2)    Time: 8938-1017 PT Time Calculation (min) (ACUTE ONLY): 29 min   Charges:   PT Evaluation $PT Eval Low Complexity: 1 Low PT Treatments $Gait Training: 8-22 mins        Baxter Flattery, PT  Acute Rehab Dept ([facility]) [phone] Pager [phone]  04/07/2022   [facility] 04/07/2022, 4:59 PM

## 2022-04-07 NOTE — Anesthesia Procedure Notes (Signed)
Anesthesia Regional Block: Adductor canal block   Pre-Anesthetic Checklist: , timeout performed,  Correct Patient, Correct Site, Correct Laterality,  Correct Procedure, Correct Position, site marked,  Risks and benefits discussed,  Surgical consent,  Pre-op evaluation,  At surgeon's request and post-op pain management  Laterality: Right  Prep: Maximum Sterile Barrier Precautions used, chloraprep       Needles:  Injection technique: Single-shot  Needle Type: Echogenic Stimulator Needle     Needle Length: 9cm  Needle Gauge: 22     Additional Needles:   Procedures:,,,, ultrasound used (permanent image in chart),,    Narrative:  Start time: 04/07/2022 7:25 AM End time: 04/07/2022 7:30 AM Injection made incrementally with aspirations every 5 mL.  Performed by: Personally  Anesthesiologist: Pervis Hocking, DO  Additional Notes: Monitors applied. No increased pain on injection. No increased resistance to injection. Injection made in 5cc increments. Good needle visualization. Patient tolerated procedure well.

## 2022-04-07 NOTE — Transfer of Care (Signed)
Immediate Anesthesia Transfer of Care Note  Patient: Kathryn Ayala  Procedure(s) Performed: TOTAL KNEE ARTHROPLASTY (Right: Knee)  Patient Location: PACU  Anesthesia Type:Spinal  Level of Consciousness: awake, alert  and oriented  Airway & Oxygen Therapy: Patient Spontanous Breathing and Patient connected to face mask oxygen  Post-op Assessment: Report given to RN and Post -op Vital signs reviewed and stable  Post vital signs: Reviewed and stable  Last Vitals:  Vitals Value Taken Time  BP 117/68 04/07/22 0945  Temp    Pulse 66 04/07/22 0948  Resp 15 04/07/22 0948  SpO2 99 % 04/07/22 0948  Vitals shown include unvalidated device data.  Last Pain:  Vitals:   04/07/22 0648  TempSrc:   PainSc: 5       Patients Stated Pain Goal: 3 (75/30/10 4045)  Complications: No notable events documented.

## 2022-04-07 NOTE — Plan of Care (Signed)

## 2022-04-07 NOTE — Interval H&P Note (Signed)
History and Physical Interval Note:  04/07/2022 6:37 AM  Kathryn Ayala  has presented today for surgery, with the diagnosis of right knee osteoarthritis.  The various methods of treatment have been discussed with the patient and family. After consideration of risks, benefits and other options for treatment, the patient has consented to  Procedure(s): TOTAL KNEE ARTHROPLASTY (Right) as a surgical intervention.  The patient's history has been reviewed, patient examined, no change in status, stable for surgery.  I have reviewed the patient's chart and labs.  Questions were answered to the patient's satisfaction.     Pilar Plate Julio Zappia

## 2022-04-07 NOTE — Anesthesia Procedure Notes (Signed)
Arterial Line Insertion Start/End6/19/2023 7:20 AM, 04/07/2022 7:25 AM  Patient location: Pre-op. Preanesthetic checklist: patient identified, IV checked, site marked, risks and benefits discussed, surgical consent, monitors and equipment checked, pre-op evaluation, timeout performed and anesthesia consent Lidocaine 1% used for infiltration radial was placed Catheter size: 20 G Hand hygiene performed  and maximum sterile barriers used   Attempts: 1 Procedure performed without using ultrasound guided technique. Following insertion, dressing applied. Post procedure assessment: normal and unchanged  Patient tolerated the procedure well with no immediate complications.

## 2022-04-07 NOTE — Care Plan (Signed)
Ortho Bundle Case Management Note  Patient Details  Name: Kathryn Ayala MRN: [id number] Date of Birth: 11-13-1934  R TKA on 04-07-22 DCP:  Home with dtr DME:  RW ordered through [facility] [facility] PT:  Duayne Cal on 04-10-22                   DME Arranged:  Gilford Rile rolling DME Agency:  [employer]  HH Arranged:  NA [place] Agency:  NA  Additional Comments: Please contact me with any questions of if this plan should need to change.  Marianne Sofia, RN,CCM [facility]  [phone] 04/07/2022, 10:41 AM

## 2022-04-07 NOTE — Anesthesia Procedure Notes (Signed)
Spinal  Patient location during procedure: OR End time: 04/07/2022 8:08 AM Reason for block: surgical anesthesia Staffing Performed: resident/CRNA  Anesthesiologist: Pervis Hocking, DO Resident/CRNA: Maxwell Caul, CRNA Performed by: Maxwell Caul, CRNA Authorized by: Pervis Hocking, DO   Preanesthetic Checklist Completed: patient identified, IV checked, site marked, risks and benefits discussed, surgical consent, monitors and equipment checked, pre-op evaluation and timeout performed Spinal Block Patient position: sitting Prep: DuraPrep Patient monitoring: heart rate, cardiac monitor, continuous pulse ox and blood pressure Approach: midline Location: L3-4 Injection technique: single-shot Needle Needle type: Pencan  Needle gauge: 24 G Needle length: 10 cm Assessment Sensory level: T4 Events: CSF return Additional Notes IV functioning, monitors applied to pt. Expiration date of kit checked and confirmed to be in date. Sterile prep and drape, hand hygiene and sterile gloves used. Pt was positioned and spine was prepped in sterile fashion. Skin was anesthetized with lidocaine. Free flow of clear CSF obtained prior to injecting local anesthetic into CSF x 1 attempt. Spinal needle aspirated freely following injection. Needle was carefully withdrawn, and pt tolerated procedure well. Loss of motor and sensory on exam post injection. Dr Doroteo Glassman at bedside for entire placement.

## 2022-04-07 NOTE — Progress Notes (Signed)
Orthopedic Tech Progress Note Patient Details:  Kathryn Ayala Jan 02, 1935 [id number]  CPM Right Knee CPM Right Knee: On Right Knee Flexion (Degrees): 40 Right Knee Extension (Degrees): 10  Post Interventions Patient Tolerated: Well  Alonie Gazzola E Gill Delrossi 04/07/2022, 10:01 AM

## 2022-04-07 NOTE — Anesthesia Procedure Notes (Signed)
Procedure Name: MAC Date/Time: 04/07/2022 8:01 AM  Performed by: Maxwell Caul, CRNAPre-anesthesia Checklist: Patient identified, Emergency Drugs available, Suction available and Patient being monitored Oxygen Delivery Method: Simple face mask

## 2022-04-07 NOTE — Discharge Instructions (Signed)
 Frank Aluisio, MD Total Joint Specialist [facility] [address] [place], [place] [address] [phone]  TOTAL KNEE REPLACEMENT POSTOPERATIVE DIRECTIONS    Knee Rehabilitation, Guidelines Following Surgery  Results after knee surgery are often greatly improved when you follow the exercise, range of motion and muscle strengthening exercises prescribed by your doctor. Safety measures are also important to protect the knee from further injury. If any of these exercises cause you to have increased pain or swelling in your knee joint, decrease the amount until you are comfortable again and slowly increase them. If you have problems or questions, call your caregiver or physical therapist for advice.   HOME CARE INSTRUCTIONS  Remove items at home which could result in a fall. This includes throw rugs or furniture in walking pathways.  ICE to the affected knee as much as tolerated. Icing helps control swelling. If the swelling is well controlled you will be more comfortable and rehab easier. Continue to use ice on the knee for pain and swelling from surgery. You may notice swelling that will progress down to the foot and ankle. This is normal after surgery. Elevate the leg when you are not up walking on it.    Continue to use the breathing machine which will help keep your temperature down. It is common for your temperature to cycle up and down following surgery, especially at night when you are not up moving around and exerting yourself. The breathing machine keeps your lungs expanded and your temperature down. Do not place pillow under the operative knee, focus on keeping the knee straight while resting  DIET You may resume your previous home diet once you are discharged from the hospital.  DRESSING / WOUND CARE / SHOWERING Keep your bulky bandage on for 2 days. On the third post-operative day you may remove the Ace bandage and gauze. There is a waterproof  adhesive bandage on your skin which will stay in place until your first follow-up appointment. Once you remove this you will not need to place another bandage You may begin showering 3 days following surgery, but do not submerge the incision under water.  ACTIVITY For the first 5 days, the key is rest and control of pain and swelling Do your home exercises twice a day starting on post-operative day 3. On the days you go to physical therapy, just do the home exercises once that day. You should rest, ice and elevate the leg for 50 minutes out of every hour. Get up and walk/stretch for 10 minutes per hour. After 5 days you can increase your activity slowly as tolerated. Walk with your walker as instructed. Use the walker until you are comfortable transitioning to a cane. Walk with the cane in the opposite hand of the operative leg. You may discontinue the cane once you are comfortable and walking steadily. Avoid periods of inactivity such as sitting longer than an hour when not asleep. This helps prevent blood clots.  You may discontinue the knee immobilizer once you are able to perform a straight leg raise while lying down. You may resume a sexual relationship in one month or when given the OK by your doctor.  You may return to work once you are cleared by your doctor.  Do not drive a car for 6 weeks or until released by your surgeon.  Do not drive while taking narcotics.  TED HOSE STOCKINGS Wear the elastic stockings on both legs for three weeks following surgery during the   day. You may remove them at night for sleeping.  WEIGHT BEARING Weight bearing as tolerated with assist device (walker, cane, etc) as directed, use it as long as suggested by your surgeon or therapist, typically at least 4-6 weeks.  POSTOPERATIVE CONSTIPATION PROTOCOL Constipation - defined medically as fewer than three stools per week and severe constipation as less than one stool per week.  One of the most common issues  patients have following surgery is constipation.  Even if you have a regular bowel pattern at home, your normal regimen is likely to be disrupted due to multiple reasons following surgery.  Combination of anesthesia, postoperative narcotics, change in appetite and fluid intake all can affect your bowels.  In order to avoid complications following surgery, here are some recommendations in order to help you during your recovery period.  Colace (docusate) - Pick up an over-the-counter form of Colace or another stool softener and take twice a day as long as you are requiring postoperative pain medications.  Take with a full glass of water daily.  If you experience loose stools or diarrhea, hold the colace until you stool forms back up. If your symptoms do not get better within 1 week or if they get worse, check with your doctor. Dulcolax (bisacodyl) - Pick up over-the-counter and take as directed by the product packaging as needed to assist with the movement of your bowels.  Take with a full glass of water.  Use this product as needed if not relieved by Colace only.  MiraLax (polyethylene glycol) - Pick up over-the-counter to have on hand. MiraLax is a solution that will increase the amount of water in your bowels to assist with bowel movements.  Take as directed and can mix with a glass of water, juice, soda, coffee, or tea. Take if you go more than two days without a movement. Do not use MiraLax more than once per day. Call your doctor if you are still constipated or irregular after using this medication for 7 days in a row.  If you continue to have problems with postoperative constipation, please contact the office for further assistance and recommendations.  If you experience "the worst abdominal pain ever" or develop nausea or vomiting, please contact the office immediatly for further recommendations for treatment.  ITCHING If you experience itching with your medications, try taking only a single pain  pill, or even half a pain pill at a time.  You can also use Benadryl over the counter for itching or also to help with sleep.   MEDICATIONS See your medication summary on the "After Visit Summary" that the nursing staff will review with you prior to discharge.  You may have some home medications which will be placed on hold until you complete the course of blood thinner medication.  It is important for you to complete the blood thinner medication as prescribed by your surgeon.  Continue your approved medications as instructed at time of discharge.  PRECAUTIONS If you experience chest pain or shortness of breath - call 911 immediately for transfer to the hospital emergency department.  If you develop a fever greater that 101 F, purulent drainage from wound, increased redness or drainage from wound, foul odor from the wound/dressing, or calf pain - CONTACT YOUR SURGEON.                                                     FOLLOW-UP APPOINTMENTS Make sure you keep all of your appointments after your operation with your surgeon and caregivers. You should call the office at the above phone number and make an appointment for approximately two weeks after the date of your surgery or on the date instructed by your surgeon outlined in the "After Visit Summary".  RANGE OF MOTION AND STRENGTHENING EXERCISES  Rehabilitation of the knee is important following a knee injury or an operation. After just a few days of immobilization, the muscles of the thigh which control the knee become weakened and shrink (atrophy). Knee exercises are designed to build up the tone and strength of the thigh muscles and to improve knee motion. Often times heat used for twenty to thirty minutes before working out will loosen up your tissues and help with improving the range of motion but do not use heat for the first two weeks following surgery. These exercises can be done on a training (exercise) mat, on the floor, on a table or on a bed.  Use what ever works the best and is most comfortable for you Knee exercises include:  Leg Lifts - While your knee is still immobilized in a splint or cast, you can do straight leg raises. Lift the leg to 60 degrees, hold for 3 sec, and slowly lower the leg. Repeat 10-20 times 2-3 times daily. Perform this exercise against resistance later as your knee gets better.  Quad and Hamstring Sets - Tighten up the muscle on the front of the thigh (Quad) and hold for 5-10 sec. Repeat this 10-20 times hourly. Hamstring sets are done by pushing the foot backward against an object and holding for 5-10 sec. Repeat as with quad sets.  Leg Slides: Lying on your back, slowly slide your foot toward your buttocks, bending your knee up off the floor (only go as far as is comfortable). Then slowly slide your foot back down until your leg is flat on the floor again. Angel Wings: Lying on your back spread your legs to the side as far apart as you can without causing discomfort.  A rehabilitation program following serious knee injuries can speed recovery and prevent re-injury in the future due to weakened muscles. Contact your doctor or a physical therapist for more information on knee rehabilitation.   POST-OPERATIVE OPIOID TAPER INSTRUCTIONS: It is important to wean off of your opioid medication as soon as possible. If you do not need pain medication after your surgery it is ok to stop day one. Opioids include: Codeine, Hydrocodone(Norco, Vicodin), Oxycodone(Percocet, oxycontin) and hydromorphone amongst others.  Long term and even short term use of opiods can cause: Increased pain response Dependence Constipation Depression Respiratory depression And more.  Withdrawal symptoms can include Flu like symptoms Nausea, vomiting And more Techniques to manage these symptoms Hydrate well Eat regular healthy meals Stay active Use relaxation techniques(deep breathing, meditating, yoga) Do Not substitute Alcohol to help  with tapering If you have been on opioids for less than two weeks and do not have pain than it is ok to stop all together.  Plan to wean off of opioids This plan should start within one week post op of your joint replacement. Maintain the same interval or time between taking each dose and first decrease the dose.  Cut the total daily intake of opioids by one tablet each day Next start to increase the time between doses. The last dose that should be eliminated is the evening dose.   IF YOU ARE TRANSFERRED TO   A SKILLED REHAB FACILITY If the patient is transferred to a skilled rehab facility following release from the hospital, a list of the current medications will be sent to the facility for the patient to continue.  When discharged from the skilled rehab facility, please have the facility set up the patient's Home Health Physical Therapy prior to being released. Also, the skilled facility will be responsible for providing the patient with their medications at time of release from the facility to include their pain medication, the muscle relaxants, and their blood thinner medication. If the patient is still at the rehab facility at time of the two week follow up appointment, the skilled rehab facility will also need to assist the patient in arranging follow up appointment in our office and any transportation needs.  MAKE SURE YOU:  Understand these instructions.  Get help right away if you are not doing well or get worse.   DENTAL ANTIBIOTICS:  In most cases prophylactic antibiotics for Dental procdeures after total joint surgery are not necessary.  Exceptions are as follows:  1. History of prior total joint infection  2. Severely immunocompromised (Organ Transplant, cancer chemotherapy, Rheumatoid biologic medications such as Humera)  3. Poorly controlled diabetes (A1C &gt; 8.0, blood glucose over 200)  If you have one of these conditions, contact your surgeon for an antibiotic  prescription, prior to your dental procedure.    Pick up stool softner and laxative for home use following surgery while on pain medications. Do not submerge incision under water. Please use good hand washing techniques while changing dressing each day. May shower starting three days after surgery. Please use a clean towel to pat the incision dry following showers. Continue to use ice for pain and swelling after surgery. Do not use any lotions or creams on the incision until instructed by your surgeon.  

## 2022-04-07 NOTE — Anesthesia Postprocedure Evaluation (Signed)
Anesthesia Post Note  Patient: Kathryn Ayala  Procedure(s) Performed: TOTAL KNEE ARTHROPLASTY (Right: Knee)     Patient location during evaluation: PACU Anesthesia Type: Regional, Spinal and MAC Level of consciousness: awake and alert and oriented Pain management: pain level controlled Vital Signs Assessment: post-procedure vital signs reviewed and stable Respiratory status: spontaneous breathing, nonlabored ventilation and respiratory function stable Cardiovascular status: blood pressure returned to baseline and stable Postop Assessment: no headache, no backache, spinal receding and patient able to bend at knees Anesthetic complications: no   No notable events documented.  Last Vitals:  Vitals:   04/07/22 0745 04/07/22 0945  BP:  117/68  Pulse: 82 64  Resp: 19 19  Temp:  (!) 36.3 C  SpO2: 98% 100%    Last Pain:  Vitals:   04/07/22 0945  TempSrc:   PainSc: 0-No pain                 Pervis Hocking

## 2022-04-07 NOTE — Op Note (Signed)
OPERATIVE REPORT-TOTAL KNEE ARTHROPLASTY   Pre-operative diagnosis- Osteoarthritis  Right knee(s)  Post-operative diagnosis- Osteoarthritis Right knee(s)  Procedure-  Right  Total Knee Arthroplasty  Surgeon- Dione Plover. Mariadelaluz Guggenheim, MD  Assistant- Jaynie Bream, PA-C   Anesthesia-   Adductor canal block and spinal  EBL-50 mL   Drains None  Tourniquet time-  Total Tourniquet Time Documented: Thigh (Right) - 38 minutes Total: Thigh (Right) - 38 minutes     Complications- None  Condition-PACU - hemodynamically stable.   Brief Clinical Note  Kathryn Ayala is a 87 y.o. year old female with end stage OA of her right knee with progressively worsening pain and dysfunction. She has constant pain, with activity and at rest and significant functional deficits with difficulties even with ADLs. She has had extensive non-op management including analgesics, injections of cortisone and viscosupplements, and home exercise program, but remains in significant pain with significant dysfunction.Radiographs show bone on bone arthritis medial and patellofemoral. She presents now for right Total Knee Arthroplasty.     Procedure in detail---   The patient is brought into the operating room and positioned supine on the operating table. After successful administration of  Adductor canal block and spinal,   a tourniquet is placed high on the  Right thigh(s) and the lower extremity is prepped and draped in the usual sterile fashion. Time out is performed by the operating team and then the  Right lower extremity is wrapped in Esmarch, knee flexed and the tourniquet inflated to 300 mmHg.       A midline incision is made with a ten blade through the subcutaneous tissue to the level of the extensor mechanism. A fresh blade is used to make a medial parapatellar arthrotomy. Soft tissue over the proximal medial tibia is subperiosteally elevated to the joint line with a knife and into the semimembranosus bursa with  a Cobb elevator. Soft tissue over the proximal lateral tibia is elevated with attention being paid to avoiding the patellar tendon on the tibial tubercle. The patella is everted, knee flexed 90 degrees and the ACL and PCL are removed. Findings are bone on bone medial and patellofemoral with large global osteophytes.        The drill is used to create a starting hole in the distal femur and the canal is thoroughly irrigated with sterile saline to remove the fatty contents. The 5 degree Right  valgus alignment guide is placed into the femoral canal and the distal femoral cutting block is pinned to remove 10 mm off the distal femur. Resection is made with an oscillating saw.      The tibia is subluxed forward and the menisci are removed. The extramedullary alignment guide is placed referencing proximally at the medial aspect of the tibial tubercle and distally along the second metatarsal axis and tibial crest. The block is pinned to remove 10m off the more deficient medial  side. Resection is made with an oscillating saw. Size 3is the most appropriate size for the tibia and the proximal tibia is prepared with the modular drill and keel punch for that size.      The femoral sizing guide is placed and size 3 is most appropriate. Rotation is marked off the epicondylar axis and confirmed by creating a rectangular flexion gap at 90 degrees. The size 3 cutting block is pinned in this rotation and the anterior, posterior and chamfer cuts are made with the oscillating saw. The intercondylar block is then placed and that cut is made.  Trial size 3 tibial component, trial size 3 posterior stabilized femur and a 12.5  mm posterior stabilized rotating platform insert trial is placed. Full extension is achieved with excellent varus/valgus and anterior/posterior balance throughout full range of motion. The patella is everted and thickness measured to be 22  mm. Free hand resection is taken to 12 mm, a 35 template is placed,  lug holes are drilled, trial patella is placed, and it tracks normally. Osteophytes are removed off the posterior femur with the trial in place. All trials are removed and the cut bone surfaces prepared with pulsatile lavage. Cement is mixed and once ready for implantation, the size 3 tibial implant, size  3 posterior stabilized femoral component, and the size 35 patella are cemented in place and the patella is held with the clamp. The trial insert is placed and the knee held in full extension. The Exparel (20 ml mixed with 60 ml saline) is injected into the extensor mechanism, posterior capsule, medial and lateral gutters and subcutaneous tissues.  All extruded cement is removed and once the cement is hard the permanent 12.5 mm posterior stabilized rotating platform insert is placed into the tibial tray.      The wound is copiously irrigated with saline solution and the extensor mechanism closed with # 0 Stratofix suture. The tourniquet is released for a total tourniquet time of 38  minutes. Flexion against gravity is 140 degrees and the patella tracks normally. Subcutaneous tissue is closed with 2.0 vicryl and subcuticular with running 4.0 Monocryl. The incision is cleaned and dried and steri-strips and a bulky sterile dressing are applied. The limb is placed into a knee immobilizer and the patient is awakened and transported to recovery in stable condition.      Please note that a surgical assistant was a medical necessity for this procedure in order to perform it in a safe and expeditious manner. Surgical assistant was necessary to retract the ligaments and vital neurovascular structures to prevent injury to them and also necessary for proper positioning of the limb to allow for anatomic placement of the prosthesis.   Dione Plover Quiana Cobaugh, MD    04/07/2022, 9:16 AM

## 2022-04-08 ENCOUNTER — Encounter (HOSPITAL_COMMUNITY): Payer: Self-pay | Admitting: Orthopedic Surgery

## 2022-04-08 DIAGNOSIS — M1711 Unilateral primary osteoarthritis, right knee: Secondary | ICD-10-CM | POA: Diagnosis not present

## 2022-04-08 DIAGNOSIS — Z79899 Other long term (current) drug therapy: Secondary | ICD-10-CM | POA: Diagnosis not present

## 2022-04-08 DIAGNOSIS — N1831 Chronic kidney disease, stage 3a: Secondary | ICD-10-CM | POA: Diagnosis not present

## 2022-04-08 DIAGNOSIS — Z853 Personal history of malignant neoplasm of breast: Secondary | ICD-10-CM | POA: Diagnosis not present

## 2022-04-08 DIAGNOSIS — I129 Hypertensive chronic kidney disease with stage 1 through stage 4 chronic kidney disease, or unspecified chronic kidney disease: Secondary | ICD-10-CM | POA: Diagnosis not present

## 2022-04-08 DIAGNOSIS — Z8673 Personal history of transient ischemic attack (TIA), and cerebral infarction without residual deficits: Secondary | ICD-10-CM | POA: Diagnosis not present

## 2022-04-08 LAB — BASIC METABOLIC PANEL
Anion gap: 10 (ref 5–15)
BUN: 25 mg/dL — ABNORMAL HIGH (ref 8–23)
CO2: 22 mmol/L (ref 22–32)
Calcium: 9.3 mg/dL (ref 8.9–10.3)
Chloride: 107 mmol/L (ref 98–111)
Creatinine, Ser: 0.77 mg/dL (ref 0.44–1.00)
GFR, Estimated: >60 mL/min (ref >60)
Glucose, Bld: 166 mg/dL — ABNORMAL HIGH (ref 70–99)
Potassium: 3.1 mmol/L — ABNORMAL LOW (ref 3.5–5.1)
Sodium: 139 mmol/L (ref 135–145)

## 2022-04-08 LAB — CBC
HCT: 36.1 % (ref 36.0–46.0)
Hemoglobin: 11.3 g/dL — ABNORMAL LOW (ref 12.0–15.0)
MCH: 31.3 pg (ref 26.0–34.0)
MCHC: 31.3 g/dL (ref 30.0–36.0)
MCV: 100 fL (ref 80.0–100.0)
Platelets: 192 10*3/uL (ref 150–400)
RBC: 3.61 MIL/uL — ABNORMAL LOW (ref 3.87–5.11)
RDW: 14.4 % (ref 11.5–15.5)
WBC: 10.5 10*3/uL (ref 4.0–10.5)
nRBC: 0 % (ref 0.0–0.2)

## 2022-04-08 MED ORDER — METHOCARBAMOL 500 MG PO TABS: 500.0000 mg | ORAL_TABLET | Freq: Four times a day (QID) | ORAL | 0 refills | Status: DC | PRN

## 2022-04-08 MED ORDER — POTASSIUM CHLORIDE CRYS ER 20 MEQ PO TBCR
40.0000 meq | EXTENDED_RELEASE_TABLET | ORAL | Status: AC
  Administered 2022-04-08 (×2): 40 meq via ORAL
  Filled 2022-04-08 (×2): qty 2

## 2022-04-08 MED ORDER — TRAMADOL HCL 50 MG PO TABS: 50.0000 mg | ORAL_TABLET | Freq: Four times a day (QID) | ORAL | 0 refills | Status: DC | PRN

## 2022-04-08 NOTE — Progress Notes (Signed)
Physical Therapy Treatment Patient Details Name: Kathryn Ayala MRN: [id number] DOB: 03-Feb-1935 Today's Date: 04/08/2022   History of Present Illness 87 yo  female s/p R TKA on 04/07/22. PMH: HTN, fibromyalgia, CKD, CVA, sensorineural hearing loss    PT Comments    Pt is progressing well, meeting goals. Pt is ready for d/c with family assist as needed from PT standpoint.   Recommendations for follow up therapy are one component of a multi-disciplinary discharge planning process, led by the attending physician.  Recommendations may be updated based on patient status, additional functional criteria and insurance authorization.  Follow Up Recommendations  Follow physician's recommendations for discharge plan and follow up therapies     Assistance Recommended at Discharge Intermittent Supervision/Assistance  Patient can return home with the following A little help with walking and/or transfers;Assist for transportation;Help with stairs or ramp for entrance;Assistance with cooking/housework   Equipment Recommendations  Rolling walker (2 wheels)    Recommendations for Other Services       Precautions / Restrictions Precautions Precautions: Fall;Knee Restrictions Weight Bearing Restrictions: No Other Position/Activity Restrictions: WBAT     Mobility  Bed Mobility   Bed Mobility: Supine to Sit     Supine to sit: Min guard     General bed mobility comments: incr time, cues for completion    Transfers Overall transfer level: Needs assistance Equipment used: Rolling walker (2 wheels) Transfers: Sit to/from Stand Sit to Stand: Min assist, Min guard           General transfer comment: cues for hand placement,  bil LE position, light assist to rise and stabilize    Ambulation/Gait Ambulation/Gait assistance: Min guard, Supervision Gait Distance (Feet): 70 Feet Assistive device: Rolling walker (2 wheels) Gait Pattern/deviations: Step-to pattern, Decreased stance time  - right Gait velocity: decr     General Gait Details: multi-modal cues for sequence, R knee flexion during swing phase and heel strike on R, for  RW position. pt demonstrating carryover from previous sessions   Stairs Stairs: Yes Stairs assistance: Min guard, Min assist Stair Management: One rail Right, One rail Left, Step to pattern, Sideways Number of Stairs: 1 General stair comments: cues for sequence and position. good stability, no knee buckling, dtr present for session   Wheelchair Mobility    Modified Rankin (Stroke Patients Only)       Balance                                            Cognition Arousal/Alertness: Awake/alert Behavior During Therapy: WFL for tasks assessed/performed Overall Cognitive Status: Within Functional Limits for tasks assessed                                          Exercises Total Joint Exercises Ankle Circles/Pumps: AROM, Both, 10 reps Quad Sets: AROM, Both, 10 reps Heel Slides: AAROM, 10 reps, Right Straight Leg Raises: AROM, Right, 10 reps, AAROM    General Comments        Pertinent Vitals/Pain Pain Assessment Pain Assessment: 0-10 Pain Score: 4  Pain Location: right knee and left knee Pain Descriptors / Indicators: Aching, Grimacing, Sore, Operative site guarding Pain Intervention(s): Limited activity within patient's tolerance, Monitored during session, Repositioned, Premedicated before session    Home Living  Prior Function            PT Goals (current goals can now be found in the care plan section) Acute Rehab PT Goals Patient Stated Goal: have less knee pain PT Goal Formulation: With patient Time For Goal Achievement: 04/14/22 Potential to Achieve Goals: Good Progress towards PT goals: Progressing toward goals    Frequency    7X/week      PT Plan Current plan remains appropriate    Co-evaluation              AM-PAC PT "6  Clicks" Mobility   Outcome Measure  Help needed turning from your back to your side while in a flat bed without using bedrails?: A Little Help needed moving from lying on your back to sitting on the side of a flat bed without using bedrails?: None Help needed moving to and from a bed to a chair (including a wheelchair)?: A Little Help needed standing up from a chair using your arms (e.g., wheelchair or bedside chair)?: A Little Help needed to walk in hospital room?: A Little Help needed climbing 3-5 steps with a railing? : A Little 6 Click Score: 19    End of Session Equipment Utilized During Treatment: Gait belt Activity Tolerance: Patient tolerated treatment well Patient left: in chair;with call bell/phone within reach;with chair alarm set;with family/visitor present Nurse Communication: Mobility status PT Visit Diagnosis: Other abnormalities of gait and mobility (R26.89);Difficulty in walking, not elsewhere classified (R26.2)     Time: 4010-2725 PT Time Calculation (min) (ACUTE ONLY): 35 min  Charges:  $Gait Training: 23-37 mins                     Baxter Flattery, PT  Acute Rehab Dept [facility]) [phone]  [facility] Weekend Pager [facility] only)  [phone]  04/08/2022    [facility] 04/08/2022, 2:59 PM

## 2022-04-08 NOTE — Progress Notes (Signed)
Subjective: 1 Day Post-Op Procedure(s) (LRB): TOTAL KNEE ARTHROPLASTY (Right) Patient reports pain as mild.   Patient seen in rounds by Dr. Wynelle Link. Patient is well, and has had no acute complaints or problems No issues overnight. Denies chest pain, SOB, or calf pain. Foley catheter removed this AM.  We will continue therapy today, ambulated 22' yesterday.   Objective: Vital signs in last 24 hours: Temp:  [97.4 F (36.3 C)-98.3 F (36.8 C)] 98.1 F (36.7 C) (06/20 0105) Pulse Rate:  [48-92] 73 (06/20 0634) Resp:  [10-19] 17 (06/20 0634) BP: (110-139)/(56-82) 139/69 (06/20 0634) SpO2:  [94 %-100 %] 97 % (06/20 0634) Arterial Line BP: (116-128)/(46-55) 116/46 (06/19 1000)  Intake/Output from previous day:  Intake/Output Summary (Last 24 hours) at 04/08/2022 0747 Last data filed at 04/08/2022 0600 Gross per 24 hour  Intake 3561.5 ml  Output 2675 ml  Net 886.5 ml     Intake/Output this shift: No intake/output data recorded.  Labs: Recent Labs    04/08/22 0353  HGB 11.3*   Recent Labs    04/08/22 0353  WBC 10.5  RBC 3.61*  HCT 36.1  PLT 192   Recent Labs    04/08/22 0353  NA 139  K 3.1*  CL 107  CO2 22  BUN 25*  CREATININE 0.77  GLUCOSE 166*  CALCIUM 9.3   No results for input(s): "LABPT", "INR" in the last 72 hours.  Exam: General - Patient is Alert and Oriented Extremity - Neurologically intact Neurovascular intact Sensation intact distally Dorsiflexion/Plantar flexion intact Dressing - dressing C/D/I Motor Function - intact, moving foot and toes well on exam.   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   Weight as of this encounter: 73 kg. Advance diet Up with therapy D/C IV fluids   Patient's anticipated LOS is less than 2 midnights, meeting these requirements: - Lives within 1 hour of care - Has a competent adult at home to recover with post-op recover - NO history of  - Chronic pain requiring opioids  - Diabetes  - Coronary Artery Disease  - Heart failure  - Heart attack  - DVT/VTE  - Respiratory Failure/COPD  - Renal failure  - Anemia  - Advanced Liver disease  DVT Prophylaxis -  Eliquis Weight bearing as tolerated. Continue therapy.  Potassium 3.1 this AM, two doses of 40 mEq Kcl ordered.  Plan is to go Home after hospital stay. Plan for discharge later today if progresses with therapy and meeting goals. Scheduled for OPPT at [facility]). Patient's daughter is a Marine scientist, who will assist with transportation and home needs.  Follow-up in the office in 2 weeks.  The PDMP database was reviewed today prior to any opioid medications being prescribed  to this patient.  Theresa Duty, PA-C Orthopedic Surgery [phone] 04/08/2022, 7:47 AM

## 2022-04-08 NOTE — TOC Transition Note (Signed)
Transition of Care [facility]) - CM/SW Discharge Note  Patient Details  Name: Kathryn Ayala MRN: [id number] Date of Birth: 09/01/1935  [facility] of [facility] CM/SW Contact:  Sherie Don, LCSW Phone Number: 04/08/2022, 10:13 AM  Clinical Narrative: Patient is expected to discharge home after working with PT. CSW met with patient to confirm discharge plan and needs. Patient will go home with OPPT at [facility] in [place]. Patient will need a rolling walker, which was delivered to patient's room by [employer]. TOC signing off.  Final next level of care: OP Rehab Barriers to Discharge: No Barriers Identified  Patient Goals and CMS Choice Patient states their goals for this hospitalization and ongoing recovery are:: Discharge home with [facility] CMS Medicare.gov Compare Post Acute Care list provided to:: Patient Choice offered to / list presented to : Patient  Discharge Plan and Services        DME Arranged: Walker rolling DME Agency: [employer] Representative spoke with at DME Agency: Prearranged in orthopedist's office HH Arranged: NA [place] Agency: NA  Readmission Risk Interventions     No data to display

## 2022-04-08 NOTE — Progress Notes (Signed)
Physical Therapy Treatment Patient Details Name: Kathryn Ayala MRN: [id number] DOB: [birth year] Today's Date: 04/08/2022   History of Present Illness 88 yo  female s/p R TKA on 04/07/22. PMH: HTN, fibromyalgia, CKD, CVA, sensorineural hearing loss    PT Comments    Pt progressing well this am. Amb hallway distance and reviewed TKA HEP. Will see  for a second session and pt should be ready to d/c later today   Recommendations for follow up therapy are one component of a multi-disciplinary discharge planning process, led by the attending physician.  Recommendations may be updated based on patient status, additional functional criteria and insurance authorization.  Follow Up Recommendations  Follow physician's recommendations for discharge plan and follow up therapies     Assistance Recommended at Discharge Intermittent Supervision/Assistance  Patient can return home with the following A little help with walking and/or transfers;Assist for transportation;Help with stairs or ramp for entrance;Assistance with cooking/housework   Equipment Recommendations  Rolling walker (2 wheels)    Recommendations for Other Services       Precautions / Restrictions Precautions Precautions: Fall;Knee Restrictions Weight Bearing Restrictions: No Other Position/Activity Restrictions: WBAT     Mobility  Bed Mobility   Bed Mobility: Supine to Sit     Supine to sit: Min guard     General bed mobility comments: incr time, cues for completion    Transfers Overall transfer level: Needs assistance Equipment used: Rolling walker (2 wheels) Transfers: Sit to/from Stand Sit to Stand: Min assist           General transfer comment: cues for hand placement, RLE position, light assist to rise and stabilize    Ambulation/Gait Ambulation/Gait assistance: Min guard Gait Distance (Feet): 60 Feet Assistive device: Rolling walker (2 wheels) Gait Pattern/deviations: Step-to pattern, Decreased  stance time - right Gait velocity: decr     General Gait Details: multi-modal cues for sequence, RW position. min assist to balance while wt shifting   Stairs             Wheelchair Mobility    Modified Rankin (Stroke Patients Only)       Balance                                            Cognition Arousal/Alertness: Awake/alert Behavior During Therapy: WFL for tasks assessed/performed Overall Cognitive Status: Within Functional Limits for tasks assessed                                          Exercises Total Joint Exercises Ankle Circles/Pumps: AROM, Both, 10 reps Quad Sets: AROM, Both, 10 reps Heel Slides: AAROM, 10 reps, Right Straight Leg Raises: AROM, Right, 10 reps, AAROM    General Comments        Pertinent Vitals/Pain Pain Assessment Pain Assessment: 0-10 Pain Score: 5  Pain Location: right knee and left knee Pain Descriptors / Indicators: Aching, Grimacing, Sore, Operative site guarding Pain Intervention(s): Limited activity within patient's tolerance, Monitored during session, Premedicated before session, Repositioned    Home Living                          Prior Function            PT Goals (  current goals can now be found in the care plan section) Acute Rehab PT Goals Patient Stated Goal: have less knee pain PT Goal Formulation: With patient Time For Goal Achievement: 04/14/22 Potential to Achieve Goals: Good Progress towards PT goals: Progressing toward goals    Frequency    7X/week      PT Plan Current plan remains appropriate    Co-evaluation              AM-PAC PT "6 Clicks" Mobility   Outcome Measure  Help needed turning from your back to your side while in a flat bed without using bedrails?: A Little Help needed moving from lying on your back to sitting on the side of a flat bed without using bedrails?: A Little Help needed moving to and from a bed to a chair  (including a wheelchair)?: A Little Help needed standing up from a chair using your arms (e.g., wheelchair or bedside chair)?: A Little Help needed to walk in hospital room?: A Little Help needed climbing 3-5 steps with a railing? : A Little 6 Click Score: 18    End of Session Equipment Utilized During Treatment: Gait belt Activity Tolerance: Patient tolerated treatment well Patient left: in chair;with call bell/phone within reach;with chair alarm set;with family/visitor present Nurse Communication: Mobility status PT Visit Diagnosis: Other abnormalities of gait and mobility (R26.89);Difficulty in walking, not elsewhere classified (R26.2)     Time: 1610-9604 PT Time Calculation (min) (ACUTE ONLY): 27 min  Charges:  $Gait Training: 8-22 mins $Therapeutic Exercise: 8-22 mins                     Baxter Flattery, PT  Acute Rehab Dept ([place]) [phone] Pager [phone]  04/08/2022    [facility] 04/08/2022, 9:59 AM

## 2022-04-08 NOTE — Progress Notes (Signed)
Patient discharged to home w/ family. Given all belongings, instructions, equipment. Verbalized understanding of all instructions. Escorted to pov via w/c. 

## 2022-04-09 NOTE — Discharge Summary (Signed)
Patient ID: Kathryn Ayala MRN: [id number] DOB/AGE: 1935/08/02 87 y.o.  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 female who was admitted 04/07/2022 for operative treatment ofUnilateral primary osteoarthritis, right knee. Patient has severe unremitting pain that affects sleep, daily activities, and work/hobbies. After pre-op clearance the patient was taken to the operating room on 04/07/2022 and underwent  Procedure(s): TOTAL KNEE ARTHROPLASTY.    Patient was given perioperative antibiotics:  Anti-infectives (From admission, onward)    Start     Dose/Rate Route Frequency Ordered Stop   04/07/22 2000  vancomycin (VANCOCIN) IVPB 1000 mg/200 mL premix        1,000 mg 200 mL/hr over 60 Minutes Intravenous Every 12 hours 04/07/22 0934 04/07/22 2117   04/07/22 0630  vancomycin (VANCOCIN) IVPB 1000 mg/200 mL premix        1,000 mg 200 mL/hr over 60 Minutes Intravenous On call to O.R. 04/07/22 3382 04/07/22 0844        Patient was given sequential compression devices, early ambulation, and chemoprophylaxis to prevent DVT.  Patient benefited maximally from hospital stay and there were no complications.    Recent vital signs: Patient Vitals for the past 24 hrs:  BP Temp Temp src Pulse  Resp SpO2  04/08/22 0930 118/67 97.8 F (36.6 C) Oral 77 18 98 %     Recent laboratory studies:  Recent Labs    04/08/22 0353  WBC 10.5  HGB 11.3*  HCT 36.1  PLT 192  NA 139  K 3.1*  CL 107  CO2 22  BUN 25*  CREATININE 0.77  GLUCOSE 166*  CALCIUM 9.3     Discharge Medications:   Allergies as of 04/08/2022       Reactions   Penicillins Anaphylaxis, Swelling, Rash   Has patient had a PCN reaction causing immediate rash, facial/tongue/throat swelling, SOB or lightheadedness with hypotension: Yes Has patient had a PCN reaction causing severe rash involving mucus membranes or skin necrosis: Yes Has patient had a PCN reaction that required hospitalization No Has patient had a PCN reaction occurring within the last 10 years: No If all of the above answers are "NO", then may proceed with Cephalosporin use.   Azithromycin    Unknown   Codeine Other (See Comments)   UNSPECIFIED REACTION Patient was told not to take it.   Levaquin [levofloxacin] Other (See Comments)   UNSPECIFIED REACTION Patient was told not to take it.   Elemental Sulfur Itching, Rash   Sulfa Antibiotics Itching, Rash        Medication List     STOP taking these medications    diclofenac Sodium 1 % Gel Commonly known as: VOLTAREN  TAKE these medications    acetaminophen 500 MG tablet Commonly known as: TYLENOL Take 1,000 mg by mouth every 6 (six) hours as needed for moderate pain.   allopurinol 100 MG tablet Commonly known as: ZYLOPRIM Take 100 mg by mouth 2 (two) times daily.   atenolol 50 MG tablet Commonly known as: TENORMIN Take 50 mg by mouth 2 (two) times daily.   Biotin 5000 5 MG Caps Generic drug: Biotin Take 5 mg by mouth daily.   Cholecalciferol 50 MCG (2000 UT) Tabs Take 2,000 Units by mouth daily.   Eliquis 5 MG Tabs tablet Generic drug: apixaban TAKE 1 TABLET BY MOUTH TWICE A DAY   furosemide 20 MG tablet Commonly known as: LASIX Take 20 mg by mouth daily  as needed for edema.   loratadine 10 MG tablet Commonly known as: CLARITIN Take 10 mg by mouth daily.   methocarbamol 500 MG tablet Commonly known as: ROBAXIN Take 1 tablet (500 mg total) by mouth every 6 (six) hours as needed for muscle spasms.   rosuvastatin 20 MG tablet Commonly known as: CRESTOR Take 10 mg by mouth daily.   traMADol 50 MG tablet Commonly known as: ULTRAM Take 1-2 tablets (50-100 mg total) by mouth every 6 (six) hours as needed for moderate pain or severe pain.   triamcinolone cream 0.1 % Commonly known as: KENALOG Apply 1 application. topically at bedtime as needed (redness / itching).   triamterene-hydrochlorothiazide 37.5-25 MG tablet Commonly known as: MAXZIDE-25 TAKE 1 TABLET BY MOUTH EVERY DAY   venlafaxine XR 37.5 MG 24 hr capsule Commonly known as: EFFEXOR-XR Take 37.5 mg by mouth daily.   vitamin B-12 500 MCG tablet Commonly known as: CYANOCOBALAMIN Take 500 mcg by mouth daily.   vitamin E 180 MG (400 UNITS) capsule Take 400 Units by mouth daily.               Discharge Care Instructions  (From admission, onward)           Start     Ordered   04/08/22 0000  Weight bearing as tolerated        04/08/22 0750   04/08/22 0000  Change dressing       Comments: You may remove the bulky bandage (ACE wrap and gauze) two days after surgery. You will have an adhesive waterproof bandage underneath. Leave this in place until your first follow-up appointment.   04/08/22 0750            Diagnostic Studies: No results found.  Disposition: Discharge disposition: 01-Home or Self Care       Discharge Instructions     Call MD / Call 911   Complete by: As directed    If you experience chest pain or shortness of breath, CALL 911 and be transported to the hospital emergency room.  If you develope a fever above 101 F, pus (white drainage) or increased drainage or redness at the wound, or calf pain, call your surgeon's office.   Change  dressing   Complete by: As directed    You may remove the bulky bandage (ACE wrap and gauze) two days after surgery. You will have an adhesive waterproof bandage underneath. Leave this in place until your first follow-up appointment.   Constipation Prevention   Complete by: As directed    Drink plenty of fluids.  Prune juice may be helpful.  You may use a stool softener, such as Colace (over the counter) 100 mg twice a day.  Use MiraLax (over the counter) for constipation as needed.   Diet - low sodium heart healthy   Complete by: As directed    Do not put a pillow under the knee. Place it under the heel.   Complete by: As directed    Driving restrictions   Complete by: As directed    No driving for two weeks   Post-operative opioid taper instructions:   Complete by: As directed    POST-OPERATIVE OPIOID TAPER INSTRUCTIONS: It is important to wean off of your opioid medication as soon as possible. If you do not need pain medication after your surgery it is ok to stop day one. Opioids include: Codeine, Hydrocodone(Norco, Vicodin), Oxycodone(Percocet, oxycontin) and hydromorphone amongst others.  Long term and even short term use of opiods can cause: Increased pain response Dependence Constipation Depression Respiratory depression And more.  Withdrawal symptoms can include Flu like symptoms Nausea, vomiting And more Techniques to manage these symptoms Hydrate well Eat regular healthy meals Stay active Use relaxation techniques(deep breathing, meditating, yoga) Do Not substitute Alcohol to help with tapering If you have been on opioids for less than two weeks and do not have pain than it is ok to stop all together.  Plan to wean off of opioids This plan should start within one week post op of your joint replacement. Maintain the same interval or time between taking each dose and first decrease the dose.  Cut the total daily intake of opioids by one tablet each day Next start to  increase the time between doses. The last dose that should be eliminated is the evening dose.      TED hose   Complete by: As directed    Use stockings (TED hose) for three weeks on both leg(s).  You may remove them at night for sleeping.   Weight bearing as tolerated   Complete by: As directed         Follow-up Information     Jearld Lesch, [place]. Go on 04/24/2022.   Specialty: Orthopedic Surgery Why: You are scheduled for a follow up appointment on 04-24-22 at 11:00 am. Contact information: [address], [address] [address] [phone]                  Signed: Theresa Duty 04/09/2022, 8:20 AM

## 2022-04-14 DIAGNOSIS — Z96651 Presence of right artificial knee joint: Secondary | ICD-10-CM | POA: Diagnosis not present

## 2022-04-14 DIAGNOSIS — M25561 Pain in right knee: Secondary | ICD-10-CM | POA: Diagnosis not present

## 2022-04-14 DIAGNOSIS — R2689 Other abnormalities of gait and mobility: Secondary | ICD-10-CM | POA: Diagnosis not present

## 2022-04-21 DIAGNOSIS — Z96651 Presence of right artificial knee joint: Secondary | ICD-10-CM | POA: Diagnosis not present

## 2022-04-21 DIAGNOSIS — R2689 Other abnormalities of gait and mobility: Secondary | ICD-10-CM | POA: Diagnosis not present

## 2022-04-21 DIAGNOSIS — M25561 Pain in right knee: Secondary | ICD-10-CM | POA: Diagnosis not present

## 2022-04-29 DIAGNOSIS — Z96651 Presence of right artificial knee joint: Secondary | ICD-10-CM | POA: Diagnosis not present

## 2022-04-29 DIAGNOSIS — M25561 Pain in right knee: Secondary | ICD-10-CM | POA: Diagnosis not present

## 2022-04-29 DIAGNOSIS — R2689 Other abnormalities of gait and mobility: Secondary | ICD-10-CM | POA: Diagnosis not present

## 2022-05-02 DIAGNOSIS — R2689 Other abnormalities of gait and mobility: Secondary | ICD-10-CM | POA: Diagnosis not present

## 2022-05-02 DIAGNOSIS — M25561 Pain in right knee: Secondary | ICD-10-CM | POA: Diagnosis not present

## 2022-05-02 DIAGNOSIS — Z96651 Presence of right artificial knee joint: Secondary | ICD-10-CM | POA: Diagnosis not present

## 2022-05-06 DIAGNOSIS — Z96651 Presence of right artificial knee joint: Secondary | ICD-10-CM | POA: Diagnosis not present

## 2022-05-06 DIAGNOSIS — R2689 Other abnormalities of gait and mobility: Secondary | ICD-10-CM | POA: Diagnosis not present

## 2022-05-06 DIAGNOSIS — M25561 Pain in right knee: Secondary | ICD-10-CM | POA: Diagnosis not present

## 2022-05-09 DIAGNOSIS — Z96651 Presence of right artificial knee joint: Secondary | ICD-10-CM | POA: Diagnosis not present

## 2022-05-09 DIAGNOSIS — R2689 Other abnormalities of gait and mobility: Secondary | ICD-10-CM | POA: Diagnosis not present

## 2022-05-09 DIAGNOSIS — M25561 Pain in right knee: Secondary | ICD-10-CM | POA: Diagnosis not present

## 2022-05-12 DIAGNOSIS — R2689 Other abnormalities of gait and mobility: Secondary | ICD-10-CM | POA: Diagnosis not present

## 2022-05-12 DIAGNOSIS — Z96651 Presence of right artificial knee joint: Secondary | ICD-10-CM | POA: Diagnosis not present

## 2022-05-12 DIAGNOSIS — M25561 Pain in right knee: Secondary | ICD-10-CM | POA: Diagnosis not present

## 2022-05-20 DIAGNOSIS — Z5189 Encounter for other specified aftercare: Secondary | ICD-10-CM | POA: Diagnosis not present

## 2022-05-20 DIAGNOSIS — M25561 Pain in right knee: Secondary | ICD-10-CM | POA: Diagnosis not present

## 2022-05-20 DIAGNOSIS — Z96651 Presence of right artificial knee joint: Secondary | ICD-10-CM | POA: Diagnosis not present

## 2022-05-20 DIAGNOSIS — R2689 Other abnormalities of gait and mobility: Secondary | ICD-10-CM | POA: Diagnosis not present

## 2022-05-20 DIAGNOSIS — M1712 Unilateral primary osteoarthritis, left knee: Secondary | ICD-10-CM | POA: Diagnosis not present

## 2022-05-27 DIAGNOSIS — Z96651 Presence of right artificial knee joint: Secondary | ICD-10-CM | POA: Diagnosis not present

## 2022-05-27 DIAGNOSIS — R2689 Other abnormalities of gait and mobility: Secondary | ICD-10-CM | POA: Diagnosis not present

## 2022-05-27 DIAGNOSIS — M25561 Pain in right knee: Secondary | ICD-10-CM | POA: Diagnosis not present

## 2022-06-03 DIAGNOSIS — Z96651 Presence of right artificial knee joint: Secondary | ICD-10-CM | POA: Diagnosis not present

## 2022-06-03 DIAGNOSIS — R2689 Other abnormalities of gait and mobility: Secondary | ICD-10-CM | POA: Diagnosis not present

## 2022-06-03 DIAGNOSIS — M25561 Pain in right knee: Secondary | ICD-10-CM | POA: Diagnosis not present

## 2022-06-10 DIAGNOSIS — R2689 Other abnormalities of gait and mobility: Secondary | ICD-10-CM | POA: Diagnosis not present

## 2022-06-10 DIAGNOSIS — Z96651 Presence of right artificial knee joint: Secondary | ICD-10-CM | POA: Diagnosis not present

## 2022-06-10 DIAGNOSIS — M25561 Pain in right knee: Secondary | ICD-10-CM | POA: Diagnosis not present

## 2022-06-17 ENCOUNTER — Other Ambulatory Visit: Payer: Self-pay | Admitting: Cardiology

## 2022-06-17 DIAGNOSIS — I10 Essential (primary) hypertension: Secondary | ICD-10-CM

## 2022-06-18 DIAGNOSIS — Z85828 Personal history of other malignant neoplasm of skin: Secondary | ICD-10-CM | POA: Diagnosis not present

## 2022-06-18 DIAGNOSIS — D044 Carcinoma in situ of skin of scalp and neck: Secondary | ICD-10-CM | POA: Diagnosis not present

## 2022-06-18 DIAGNOSIS — L570 Actinic keratosis: Secondary | ICD-10-CM | POA: Diagnosis not present

## 2022-06-18 DIAGNOSIS — L821 Other seborrheic keratosis: Secondary | ICD-10-CM | POA: Diagnosis not present

## 2022-06-25 ENCOUNTER — Other Ambulatory Visit: Payer: Self-pay | Admitting: Cardiology

## 2022-06-25 DIAGNOSIS — I340 Nonrheumatic mitral (valve) insufficiency: Secondary | ICD-10-CM

## 2022-07-09 DIAGNOSIS — M25512 Pain in left shoulder: Secondary | ICD-10-CM | POA: Diagnosis not present

## 2022-07-09 DIAGNOSIS — M25511 Pain in right shoulder: Secondary | ICD-10-CM | POA: Diagnosis not present

## 2022-07-09 DIAGNOSIS — M19011 Primary osteoarthritis, right shoulder: Secondary | ICD-10-CM | POA: Diagnosis not present

## 2022-07-14 DIAGNOSIS — R922 Inconclusive mammogram: Secondary | ICD-10-CM | POA: Diagnosis not present

## 2022-07-23 DIAGNOSIS — M65312 Trigger thumb, left thumb: Secondary | ICD-10-CM | POA: Diagnosis not present

## 2022-07-23 DIAGNOSIS — M13842 Other specified arthritis, left hand: Secondary | ICD-10-CM | POA: Diagnosis not present

## 2022-07-23 DIAGNOSIS — M79645 Pain in left finger(s): Secondary | ICD-10-CM | POA: Diagnosis not present

## 2022-07-23 DIAGNOSIS — S5332XA Traumatic rupture of left ulnar collateral ligament, initial encounter: Secondary | ICD-10-CM | POA: Diagnosis not present

## 2022-07-23 DIAGNOSIS — R2232 Localized swelling, mass and lump, left upper limb: Secondary | ICD-10-CM | POA: Diagnosis not present

## 2022-07-24 ENCOUNTER — Other Ambulatory Visit: Payer: Medicare PPO

## 2022-07-24 ENCOUNTER — Ambulatory Visit: Payer: Medicare PPO

## 2022-07-24 DIAGNOSIS — I340 Nonrheumatic mitral (valve) insufficiency: Secondary | ICD-10-CM

## 2022-08-04 ENCOUNTER — Ambulatory Visit: Payer: Medicare PPO | Admitting: Cardiology

## 2022-08-04 ENCOUNTER — Encounter: Payer: Self-pay | Admitting: Cardiology

## 2022-08-04 VITALS — BP 135/69 | HR 80 | Temp 98.7°F | Resp 16 | Ht 63.0 in | Wt 151.0 lb

## 2022-08-04 DIAGNOSIS — I4821 Permanent atrial fibrillation: Secondary | ICD-10-CM | POA: Diagnosis not present

## 2022-08-04 DIAGNOSIS — I340 Nonrheumatic mitral (valve) insufficiency: Secondary | ICD-10-CM

## 2022-08-04 DIAGNOSIS — I10 Essential (primary) hypertension: Secondary | ICD-10-CM | POA: Diagnosis not present

## 2022-08-04 NOTE — Progress Notes (Signed)
Primary Physician/Referring:  Crist Infante, MD  Patient ID: Kathryn Ayala, female    DOB: 05-15-1935, 87 y.o.   MRN: [id number]  Chief Complaint  Patient presents with   Results    Echocardiogram    Cardiac Valve Problem   HPI:    Kathryn Ayala  is a 87 y.o.  Caucasian female with permanent atrial fibrillation, essential hypertension, hyperlipidemia, mild chronic dyspnea with a negative nuclear stress test in 2018, moderate to moderately severe MR and TR with mild pulmonary hypertension presents here for 56-monthoffice visit.  She has continued to remain active and denies any dyspnea or chest pain or palpitations.  No PND or orthopnea.    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   Urinary frequency    Past Surgical History:  Procedure Laterality Date   [place] 05[place]   BREAST BIOPSY Bilateral 1956, 1980   BREAST LUMPECTOMY WITH RADIOACTIVE SEED LOCALIZATION Left 06/16/2016   Procedure: LEFT BREAST LUMPECTOMY WITH RADIOACTIVE SEED LOCALIZATION;  Surgeon: PAutumn MessingIII, MD;  [place]  Service: General;  Laterality: Left;   COLONOSCOPY     KIDNEY STONE SURGERY     KNEE SURGERY  11/20/2004   TOTAL KNEE ARTHROPLASTY Right 04/07/2022   Procedure: TOTAL KNEE ARTHROPLASTY;  Surgeon: AGaynelle Arabian MD;  Location: [facility];  Service: Orthopedics;  Laterality: Right;   WRIST SURGERY Left 2018   Social History   Tobacco Use   Smoking status: Never    Passive exposure: Never   Smokeless tobacco: Never  Substance Use Topics   Alcohol use: No  Marital Status: Married    ROS  Review of  Systems  Cardiovascular:  Positive for dyspnea on exertion and palpitations (occasional). Negative for chest pain and leg swelling.  Gastrointestinal:  Negative for melena.   Objective      08/04/2022    3:06 PM 04/08/2022    9:30 AM 04/08/2022    6:34 AM  Vitals with BMI  Height '5\' 3"'     Weight 151 lbs    BMI 228.31   Systolic [id number] Diastolic 69 67 69  Pulse 80 77 73      Physical Exam Constitutional:      Appearance: She is obese.     Comments: She is moderately built and well-nourished in no acute distress.  Neck:     Thyroid: No thyromegaly.     Vascular: No carotid bruit or JVD.  Cardiovascular:     Rate and Rhythm: Normal rate. Rhythm irregular.     Pulses: Normal pulses and intact distal pulses.     Heart sounds: S1 normal and S2 normal. Murmur heard.     High-pitched early systolic murmur is present with a grade of 2/6 at the upper right sternal border and apex.     High-pitched blowing early diastolic murmur is present with a grade of 2/4 at the upper right sternal border.     No gallop.  No S3 or S4 sounds.     Comments:   Pulmonary:     Effort: Pulmonary effort is normal.     Breath sounds: Normal breath sounds.  Abdominal:     General: Bowel sounds are normal.     Palpations: Abdomen is soft.  Musculoskeletal:        General: No swelling.     Cervical back: Neck supple.     Right lower leg: No edema.     Left lower leg: No edema.  Skin:    General: Skin is warm and dry.     Capillary Refill: Capillary refill takes less than 2 seconds.     Findings: Lesion (right ankle and shin cellulitis, mild) present.  Neurological:     General: No focal deficit present.    Laboratory examination:   External labs:  Labs 12/25/2021:  Seromucous 86 mg, BUN 39, creatinine 1.0, EGFR 52 mL, potassium 3.9.  Hb 14.1/HCT 40.9, platelets 173, normal indicis.  Labs 09/09/2021:  BUN 31, creatinine 1.1, EGFR 47 mL, potassium 3.8, CMP otherwise normal.  Hb  14.5/HCT 43.8, platelets 190.  Normal indicis.  Total cholesterol 198, triglycerides 206, HDL 69, LDL 88.  TSH normal.  A1c 6.1%.  Medications and allergies   Allergies  Allergen Reactions   Penicillins Anaphylaxis, Swelling and Rash    Has patient had a PCN reaction causing immediate rash, facial/tongue/throat swelling, SOB or lightheadedness with hypotension: Yes Has patient had a PCN reaction causing severe rash involving mucus membranes or skin necrosis: Yes Has patient had a PCN reaction that required hospitalization No Has patient had a PCN reaction occurring within the last 10 years: No If all of the above answers are "NO", then may proceed with Cephalosporin use.    Azithromycin     Unknown   Codeine Other (See Comments)    UNSPECIFIED REACTION Patient was told not to take it.   Levaquin [Levofloxacin] Other (See Comments)    UNSPECIFIED REACTION Patient was told not to take it.   Elemental Sulfur Itching and Rash   Sulfa Antibiotics Itching and Rash    Current Outpatient Medications on File Prior to Visit  Medication Sig Dispense Refill   acetaminophen (TYLENOL) 500 MG tablet Take 1,000 mg by mouth every 6 (six) hours as needed for moderate pain.     allopurinol (ZYLOPRIM) 100 MG tablet Take 100 mg by mouth 2 (two) times daily.   3   atenolol (TENORMIN) 50 MG tablet Take 50 mg by mouth 2 (two) times daily.  2   Biotin (BIOTIN 5000) 5 MG CAPS Take 5 mg by mouth daily.     Cholecalciferol 50 MCG (2000 UT) TABS Take 2,000 Units by mouth daily.     ELIQUIS 5 MG TABS tablet TAKE 1 TABLET BY MOUTH TWICE A DAY 180 tablet 1   furosemide (LASIX) 20 MG tablet Take 20 mg by mouth daily as needed for edema.     loratadine (CLARITIN) 10 MG tablet Take 10 mg by mouth daily.     rosuvastatin (CRESTOR) 20 MG tablet Take 10 mg by mouth daily.     triamterene-hydrochlorothiazide (MAXZIDE-25) 37.5-25 MG tablet TAKE 1 TABLET BY MOUTH EVERY DAY 90 tablet 2   venlafaxine XR (EFFEXOR-XR)  37.5 MG 24 hr capsule Take 37.5 mg by mouth daily.     vitamin B-12 (CYANOCOBALAMIN) 500 MCG tablet Take 500 mcg by mouth daily.     vitamin E 180 MG (400 UNITS) capsule Take 400 Units  by mouth daily.     methocarbamol (ROBAXIN) 500 MG tablet Take 1 tablet (500 mg total) by mouth every 6 (six) hours as needed for muscle spasms. 40 tablet 0   traMADol (ULTRAM) 50 MG tablet Take 1-2 tablets (50-100 mg total) by mouth every 6 (six) hours as needed for moderate pain or severe pain. 40 tablet 0   No current facility-administered medications on file prior to visit.    Radiology:  No results found. Cardiac Studies:   Lexiscan myoview stress test 12/12/2016: 1. The resting electrocardiogram demonstrated atrial fibrillation, normal resting conduction and nonspecific ST-T changes, cannot exclude inferolateral ischemia.  Stress EKG was nondiagnostic for ischemia as it's a pharmacologic stress test.  Stress symptoms included dyspnea. 2. Myocardial perfusion imaging is normal. Overall left ventricular systolic function was normal without regional wall motion abnormalities. The left ventricular ejection fraction was 73%. No significant change from [place] stress 12/19/11.  PCV ECHOCARDIOGRAM COMPLETE 07/24/2022  Narrative Echocardiogram 07/24/2022: Normal LV systolic function with visual EF 60-65%. Left ventricle cavity is normal in size. Normal left ventricular wall thickness. Normal global wall motion. Indeterminate diastolic filling pattern. Calculated EF 69%. Left atrial cavity is severely dilated at 78.3 ml/m^2. Right atrial cavity is severely dilated. Right ventricle cavity is mildly dilated. Normal right ventricular function. Structurally normal trileaflet aortic valve.  Mild (Grade I) aortic regurgitation. Moderate to severe mitral regurgitation. Mild mitral valve leaflet calcification. Structurally normal tricuspid valve.  Severe tricuspid regurgitation. Moderate pulmonary hypertension. RVSP  measures 53 mmHg. Structurally normal pulmonic valve.  Mild pulmonic regurgitation. IVC is dilated with respiratory variation. No significant change compared to 08/2021.   EKG:  EKG 08/04/2022: Atrial fibrillation with controlled ventricular response at the rate of 78 bpm, normal axis, poor R progression, cannot exclude anteroseptal infarct old.  Low-voltage complexes.  Pulmonary disease pattern.  Nonspecific T abnormality.  Compared to 11/04/2021, no significant change.  Assessment     ICD-10-CM   1. Moderate to severe mitral regurgitation  I34.0 EKG 12-Lead    2. Permanent atrial fibrillation (HCC)  I48.21     3. Primary hypertension  I10      CHA2DS2-VASc Score is 4.  Yearly risk of stroke: 4.8% (A, F, HTN).  Score of 1=0.6; 2=2.2; 3=3.2; 4=4.8; 5=7.2; 6=9.8; 7=>9.8) -(CHF; HTN; vasc disease DM,  Female = 1; Age <65 =0; 65-74 = 1,  >75 =2; stroke/embolism= 2).    No orders of the defined types were placed in this encounter.    Medications Discontinued During This Encounter  Medication Reason   triamcinolone cream (KENALOG) 0.1 %     Recommendations:   Kathryn Ayala  is a 87 y.o.   Caucasian female with permanent atrial fibrillation, essential hypertension, hyperlipidemia, mild chronic dyspnea with a negative nuclear stress test in 2018, moderate to moderately severe MR and TR with mild pulmonary hypertension presents here for 27-monthoffice visit.  She has continued to remain active and denies any dyspnea or chest pain or palpitations.  No change in physical exam, no clinical evidence of heart failure.  Although by TTE the mitral regurgitation appears to be moderately severe, on auscultation, she has a very soft mid-to-late systolic murmur in the apex with crisp first heart sound and also second heart sound is not augmented.  Hence clinically mitral regurgitation is at most moderate.  Reviewed the echocardiogram again with the patient, no change from prior echocardiogram in  2018.  Overall from valvular heart disease she is remained stable.  With regard to atrial fibrillation, she is tolerating anticoagulation without any bleeding diathesis.  She does have mild chronic renal insufficiency stage IIIa and that is remained stable and CBC is normal.    No changes were done today, blood pressure is well controlled, I will see her back in 6 months.   Patient underwent successful total knee arthroplasty on 04/07/2022 without periprocedural complications and now has been scheduled for left knee arthroplasty on 09/22/2022.  From cardiac standpoint she can be taken up for the surgery with low risk.      Adrian Prows, MD, [facility] 08/04/2022, 3:36 PM Office: [phone] Pager: [phone]

## 2022-08-05 DIAGNOSIS — L942 Calcinosis cutis: Secondary | ICD-10-CM | POA: Diagnosis not present

## 2022-08-05 DIAGNOSIS — L570 Actinic keratosis: Secondary | ICD-10-CM | POA: Diagnosis not present

## 2022-08-05 DIAGNOSIS — D485 Neoplasm of uncertain behavior of skin: Secondary | ICD-10-CM | POA: Diagnosis not present

## 2022-08-27 ENCOUNTER — Other Ambulatory Visit: Payer: Self-pay | Admitting: Internal Medicine

## 2022-08-27 ENCOUNTER — Telehealth: Payer: Self-pay

## 2022-08-27 DIAGNOSIS — M79672 Pain in left foot: Secondary | ICD-10-CM

## 2022-08-27 NOTE — Telephone Encounter (Signed)
Pt called with request to be seen for worsening "black and blue" in BLE. She is having a TKA next month and wants this to get examined before surgery. She states it has been going on for several months but has been taking care of her husband who is now in hospice. Pt has been given appt to see APP with ABIs next week.

## 2022-08-28 ENCOUNTER — Other Ambulatory Visit: Payer: Self-pay | Admitting: *Deleted

## 2022-08-28 DIAGNOSIS — I872 Venous insufficiency (chronic) (peripheral): Secondary | ICD-10-CM

## 2022-09-04 ENCOUNTER — Ambulatory Visit (HOSPITAL_COMMUNITY)
Admission: RE | Admit: 2022-09-04 | Discharge: 2022-09-04 | Disposition: A | Discharge status: Home / Self Care | Payer: Medicare PPO | Source: Ambulatory Visit | Attending: Vascular Surgery | Admitting: Vascular Surgery

## 2022-09-04 ENCOUNTER — Ambulatory Visit: Payer: Medicare PPO | Admitting: Physician Assistant

## 2022-09-04 VITALS — BP 134/92 | HR 82 | Temp 97.6°F | Ht 63.0 in | Wt 144.9 lb

## 2022-09-04 DIAGNOSIS — I872 Venous insufficiency (chronic) (peripheral): Secondary | ICD-10-CM | POA: Diagnosis not present

## 2022-09-04 DIAGNOSIS — R7301 Impaired fasting glucose: Secondary | ICD-10-CM | POA: Diagnosis not present

## 2022-09-04 DIAGNOSIS — F418 Other specified anxiety disorders: Secondary | ICD-10-CM | POA: Diagnosis not present

## 2022-09-04 DIAGNOSIS — E785 Hyperlipidemia, unspecified: Secondary | ICD-10-CM | POA: Diagnosis not present

## 2022-09-04 DIAGNOSIS — I130 Hypertensive heart and chronic kidney disease with heart failure and stage 1 through stage 4 chronic kidney disease, or unspecified chronic kidney disease: Secondary | ICD-10-CM | POA: Diagnosis not present

## 2022-09-04 DIAGNOSIS — I83893 Varicose veins of bilateral lower extremities with other complications: Secondary | ICD-10-CM | POA: Diagnosis not present

## 2022-09-04 DIAGNOSIS — E876 Hypokalemia: Secondary | ICD-10-CM | POA: Diagnosis not present

## 2022-09-04 DIAGNOSIS — I480 Paroxysmal atrial fibrillation: Secondary | ICD-10-CM | POA: Diagnosis not present

## 2022-09-04 DIAGNOSIS — N1831 Chronic kidney disease, stage 3a: Secondary | ICD-10-CM | POA: Diagnosis not present

## 2022-09-04 DIAGNOSIS — M1712 Unilateral primary osteoarthritis, left knee: Secondary | ICD-10-CM | POA: Diagnosis not present

## 2022-09-04 NOTE — Progress Notes (Signed)
Office Note     CC:  follow up Requesting Provider:  Crist Infante, MD  HPI: Kathryn Ayala is a 87 y.o. ([birth year]) female who presents for evaluation of discoloration of her feet.  She believes have become more more dark over the past few months.  She denies any claudication, rest pain, or nonhealing wounds of bilateral lower extremities.  In the past she has had swelling of bilateral lower extremities however states this has improved since she was prescribed Lasix to take as needed by her PCP.  She denies any history of DVT, venous ulcerations, trauma, or any prior vascular interventions.  She underwent total knee replacement of the right knee and has plans for left knee replacement next month.  Unfortunately her husband is active with home hospice currently so she is unsure if she will proceed with surgery next month at this time.  She is ambulatory with a walker.  She denies tobacco use.   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   Urinary frequency     Past Surgical History:  Procedure Laterality Date   [place] Left 05/07/2016   BREAST BIOPSY Bilateral 1956, 1980   BREAST LUMPECTOMY WITH RADIOACTIVE SEED LOCALIZATION Left 06/16/2016   Procedure: LEFT BREAST LUMPECTOMY WITH RADIOACTIVE SEED LOCALIZATION;  Surgeon: Autumn Messing III, MD;  Location: [facility];  Service: General;  Laterality: Left;   COLONOSCOPY     KIDNEY STONE SURGERY     KNEE SURGERY  11/20/2004   TOTAL KNEE ARTHROPLASTY Right 04/07/2022   Procedure: TOTAL KNEE ARTHROPLASTY;  Surgeon: Gaynelle Arabian, MD;  Location: [facility];  Service: Orthopedics;   Laterality: Right;   WRIST SURGERY Left 2018    Social History   Socioeconomic History   Marital status: Married    Spouse name: Not on file   Number of children: 2   Years of education: Not on file   Highest education level: Not on file  Occupational History   Not on file  Tobacco Use   Smoking status: Never    Passive exposure: Never   Smokeless tobacco: Never  Vaping Use   Vaping Use: Never used  Substance and Sexual Activity   Alcohol use: No   Drug use: No   Sexual activity: Not on file  Other Topics Concern   Not on file  Social History Narrative   Not on file   Social Determinants of Health   Financial Resource Strain: Not on file  Food Insecurity: Not on file  Transportation Needs: Not on file  Physical Activity: Not on file  Stress: Not on file  Social Connections: Not on file  Intimate Partner Violence: Not on file    Family History  Problem Relation Age of Onset   Lung cancer Brother    Stroke Mother    Pneumonia Father    Cancer Brother     Current Outpatient Medications  Medication Sig Dispense Refill   acetaminophen (TYLENOL) 500 MG tablet Take 1,000 mg by mouth  every 6 (six) hours as needed for moderate pain.     allopurinol (ZYLOPRIM) 100 MG tablet Take 100 mg by mouth 2 (two) times daily.   3   atenolol (TENORMIN) 50 MG tablet Take 50 mg by mouth 2 (two) times daily.  2   Biotin (BIOTIN 5000) 5 MG CAPS Take 5 mg by mouth daily.     Cholecalciferol 50 MCG (2000 UT) TABS Take 2,000 Units by mouth daily.     ELIQUIS 5 MG TABS tablet TAKE 1 TABLET BY MOUTH TWICE A DAY 180 tablet 1   furosemide (LASIX) 20 MG tablet Take 20 mg by mouth daily as needed for edema.     loratadine (CLARITIN) 10 MG tablet Take 10 mg by mouth daily.     methocarbamol (ROBAXIN) 500 MG tablet Take 1 tablet (500 mg total) by mouth every 6 (six) hours as needed for muscle spasms. 40 tablet 0   rosuvastatin (CRESTOR) 20 MG tablet Take 10 mg by mouth daily.     traMADol  (ULTRAM) 50 MG tablet Take 1-2 tablets (50-100 mg total) by mouth every 6 (six) hours as needed for moderate pain or severe pain. 40 tablet 0   triamterene-hydrochlorothiazide (MAXZIDE-25) 37.5-25 MG tablet TAKE 1 TABLET BY MOUTH EVERY DAY 90 tablet 2   venlafaxine XR (EFFEXOR-XR) 37.5 MG 24 hr capsule Take 37.5 mg by mouth daily.     vitamin B-12 (CYANOCOBALAMIN) 500 MCG tablet Take 500 mcg by mouth daily.     vitamin E 180 MG (400 UNITS) capsule Take 400 Units by mouth daily.     No current facility-administered medications for this visit.    Allergies  Allergen Reactions   Penicillins Anaphylaxis, Swelling and Rash    Has patient had a PCN reaction causing immediate rash, facial/tongue/throat swelling, SOB or lightheadedness with hypotension: Yes Has patient had a PCN reaction causing severe rash involving mucus membranes or skin necrosis: Yes Has patient had a PCN reaction that required hospitalization No Has patient had a PCN reaction occurring within the last 10 years: No If all of the above answers are "NO", then may proceed with Cephalosporin use.    Azithromycin     Unknown   Codeine Other (See Comments)    UNSPECIFIED REACTION Patient was told not to take it.   Levaquin [Levofloxacin] Other (See Comments)    UNSPECIFIED REACTION Patient was told not to take it.   Elemental Sulfur Itching and Rash   Sulfa Antibiotics Itching and Rash     REVIEW OF SYSTEMS:   '[X]'$  denotes positive finding, '[ ]'$  denotes negative finding Cardiac  Comments:  Chest pain or chest pressure:    Shortness of breath upon exertion:    Short of breath when lying flat:    Irregular heart rhythm:        Vascular    Pain in calf, thigh, or hip brought on by ambulation:    Pain in feet at night that wakes you up from your sleep:     Blood clot in your veins:    Leg swelling:         Pulmonary    Oxygen at home:    Productive cough:     Wheezing:         Neurologic    Sudden weakness in arms  or legs:     Sudden numbness in arms or legs:     Sudden onset of difficulty speaking or slurred speech:    Temporary loss  of vision in one eye:     Problems with dizziness:         Gastrointestinal    Blood in stool:     Vomited blood:         Genitourinary    Burning when urinating:     Blood in urine:        Psychiatric    Major depression:         Hematologic    Bleeding problems:    Problems with blood clotting too easily:        Skin    Rashes or ulcers:        Constitutional    Fever or chills:      PHYSICAL EXAMINATION:  Vitals:   09/04/22 1220  BP: (!) 134/92  Pulse: 82  Temp: 97.6 F (36.4 C)  SpO2: 99%  Weight: 144 lb 14.4 oz (65.7 kg)  Height: '5\' 3"'$  (1.6 m)    General:  WDWN in NAD; vital signs documented above Gait: Not observed HENT: WNL, normocephalic Pulmonary: normal non-labored breathing , without Rales, rhonchi,  wheezing Cardiac: regular HR Abdomen: soft, NT, no masses Skin: without rashes Vascular Exam/Pulses:  Right Left  DP 2+ (normal) 2+ (normal)   Extremities: Discoloration of both feet that improves with elevation; no open wounds of bilateral lower extremities; extensive spider veins of the ankles and feet Musculoskeletal: no muscle wasting or atrophy  Neurologic: A&O X 3;  No focal weakness or paresthesias are detected Psychiatric:  The pt has Normal affect.   Non-Invasive Vascular Imaging:   ABI/TBIToday's ABIToday's TBIPrevious ABIPrevious TBI  +-------+-----------+-----------+------------+------------+  Right 1.16       0.38                                 +-------+-----------+-----------+------------+------------+  Left  1.21       0                                    +-------+-----------+-----------+------------+------------+     ASSESSMENT/PLAN:: 87 y.o. female here for evaluation of discoloration of both feet  -Despite decreased toe pressure bilaterally, her feet are warm and well-perfused with  symmetric 2+ DP pulses.  She does not have any rest pain or tissue loss at the level of her toes thus no intervention is warranted.  She does have extensive spider veins of her ankles and feet with stasis pigmentation changes.  We discussed she likely has a component of venous insufficiency and that edema can be managed with conservative measures such as light knee-high compression to be worn during the day, proper elevation of her legs above the level of her heart periodically throughout the day, and avoiding prolonged sitting and standing.  Nothing further to offer from a vascular surgery standpoint.  She can follow-up on an as-needed basis.   Dagoberto Ligas, PA-C [facility] [phone]  Clinic MD:   Scot Dock

## 2022-09-09 NOTE — H&P (Signed)
TOTAL KNEE ADMISSION H&P  Patient is being admitted for left total knee arthroplasty.  Subjective:  Chief Complaint: Left knee pain.  HPI: Kathryn Ayala, 87 y.o. female has a history of pain and functional disability in the left knee due to arthritis and has failed non-surgical conservative treatments for greater than 12 weeks to include NSAID's and/or analgesics, corticosteriod injections, viscosupplementation injections, and activity modification. Onset of symptoms was gradual, starting several years ago with gradually worsening course since that time. The patient noted no past surgery on the left knee.  Patient currently rates pain in the left knee at 8 out of 10 with activity. Patient has night pain, worsening of pain with activity and weight bearing, pain that interferes with activities of daily living, and crepitus. Patient has evidence of  bone-on-bone arthritis in the medial and patellofemoral compartments  by imaging studies. There is no active infection.  Patient Active Problem List   Diagnosis Date Noted   Osteoarthritis of right knee 04/07/2022   Cellulitis of right lower limb 02/06/2022   Chronic kidney disease, stage 3a ([place]) 02/06/2022   Dizzy 02/06/2022   Peripheral venous insufficiency 02/06/2022   Varicose veins of bilateral lower extremities with other complications 45/40/9811   Peripheral vascular disease ([place]) 01/30/2022   Thrombophilia ([place]) 12/15/2019   Pulmonary hypertension ([place]) 09/09/2019   Vitamin D deficiency 08/25/2019   Primary osteoarthritis of both knees 08/16/2019   Acute otitis externa of left ear 03/08/2019   Sensorineural hearing loss (SNHL) of right ear with unrestricted hearing of left ear 03/08/2019   Current chronic use of systemic steroids 02/11/2019   Muscle pain 12/24/2018   Unilateral primary osteoarthritis, right knee 04/06/2018   Dermatitis 12/29/2017   Impacted cerumen 09/24/2017   Asymptomatic microscopic hematuria 06/24/2017    Trigger finger, right middle finger 02/16/2017   Acute pain of right knee 02/16/2017   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[place]) 05/26/2016   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/12/2016   Encounter for general adult medical examination without abnormal findings 05/05/2016   Chronic kidney disease due to hypertension 02/22/2015   Disorder of bone 06/21/2013   Gout 04/26/2012   Hyperlipidemia 04/26/2012   Proteinuria 04/26/2012   Paroxysmal atrial fibrillation ([place]) 12/08/2011   Underimmunization status 08/01/2010   Hypokalemia 01/07/2010   Impaired fasting glucose 01/07/2010   Allergic rhinitis 12/11/2009   Anemia 12/11/2009   Anxiety disorder 12/11/2009   Diaphragmatic hernia 12/11/2009   Primary hypertension 12/11/2009   Fibromyalgia 12/11/2009    Past Medical History:  Diagnosis Date   Anxiety    Arthritis    Atrial fibrillation ([place])    Breast cancer, left ([place])    Complication of anesthesia    rapid heart beat afterwards sometimes   Depression    Dysrhythmia    A-Fib   Hyperlipidemia    Hypertension    Kidney stones    Stroke [facility])    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, 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   Urinary frequency     Past Surgical History:  Procedure Laterality Date   [place] Left 05/07/2016   BREAST BIOPSY Bilateral 1956, 1980   BREAST LUMPECTOMY WITH RADIOACTIVE SEED LOCALIZATION Left 06/16/2016   Procedure: LEFT BREAST LUMPECTOMY WITH RADIOACTIVE SEED LOCALIZATION;  Surgeon: Autumn Messing III, MD;  Location: [place];  Service: General;  Laterality: Left;   COLONOSCOPY  KIDNEY STONE SURGERY     KNEE SURGERY  11/20/2004   TOTAL KNEE ARTHROPLASTY Right 04/07/2022   Procedure: TOTAL KNEE ARTHROPLASTY;  Surgeon: Gaynelle Arabian, MD;  Location: [facility];  Service: Orthopedics;  Laterality: Right;   WRIST SURGERY Left 2018    Prior to Admission medications   Medication Sig  Start Date End Date Taking? Authorizing Provider  acetaminophen (TYLENOL) 500 MG tablet Take 1,000 mg by mouth every 6 (six) hours as needed for moderate pain.    [provider]  allopurinol (ZYLOPRIM) 100 MG tablet Take 100 mg by mouth 2 (two) times daily.  03/04/16   [provider]  atenolol (TENORMIN) 50 MG tablet Take 50 mg by mouth 2 (two) times daily. 05/05/16   [provider]  Biotin (BIOTIN 5000) 5 MG CAPS Take 5 mg by mouth daily.    [provider]  Cholecalciferol 50 MCG (2000 UT) TABS Take 2,000 Units by mouth daily.    [provider]  ELIQUIS 5 MG TABS tablet TAKE 1 TABLET BY MOUTH TWICE A DAY 11/14/19   Adrian Prows, MD  furosemide (LASIX) 20 MG tablet Take 20 mg by mouth daily as needed for edema.    [provider]  loratadine (CLARITIN) 10 MG tablet Take 10 mg by mouth daily.    [provider]  methocarbamol (ROBAXIN) 500 MG tablet Take 1 tablet (500 mg total) by mouth every 6 (six) hours as needed for muscle spasms. 04/08/22   Edmisten, Kristie L, PA  rosuvastatin (CRESTOR) 20 MG tablet Take 10 mg by mouth daily.    [provider]  traMADol (ULTRAM) 50 MG tablet Take 1-2 tablets (50-100 mg total) by mouth every 6 (six) hours as needed for moderate pain or severe pain. 04/08/22   Edmisten, Ok Anis, PA  triamterene-hydrochlorothiazide (MAXZIDE-25) 37.5-25 MG tablet TAKE 1 TABLET BY MOUTH EVERY DAY 06/17/22   Adrian Prows, MD  venlafaxine XR (EFFEXOR-XR) 37.5 MG 24 hr capsule Take 37.5 mg by mouth daily.    [provider]  vitamin B-12 (CYANOCOBALAMIN) 500 MCG tablet Take 500 mcg by mouth daily.    [provider]  vitamin E 180 MG (400 UNITS) capsule Take 400 Units by mouth daily.    [provider]    Allergies  Allergen Reactions   Penicillins Anaphylaxis, Swelling and Rash    Has patient had a PCN reaction causing immediate rash, facial/tongue/throat swelling, SOB or  lightheadedness with hypotension: Yes Has patient had a PCN reaction causing severe rash involving mucus membranes or skin necrosis: Yes Has patient had a PCN reaction that required hospitalization No Has patient had a PCN reaction occurring within the last 10 years: No If all of the above answers are "NO", then may proceed with Cephalosporin use.    Azithromycin     Unknown   Codeine Other (See Comments)    UNSPECIFIED REACTION Patient was told not to take it.   Levaquin [Levofloxacin] Other (See Comments)    UNSPECIFIED REACTION Patient was told not to take it.   Elemental Sulfur Itching and Rash   Sulfa Antibiotics Itching and Rash    Social History   Socioeconomic History   Marital status: Married    Spouse name: Not on file   Number of children: 2   Years of education: Not on file   Highest education level: Not on file  Occupational History   Not on file  Tobacco Use   Smoking status: Never  Passive exposure: Never   Smokeless tobacco: Never  Vaping Use   Vaping Use: Never used  Substance and Sexual Activity   Alcohol use: No   Drug use: No   Sexual activity: Not on file  Other Topics Concern   Not on file  Social History Narrative   Not on file   Social Determinants of Health   Financial Resource Strain: Not on file  Food Insecurity: Not on file  Transportation Needs: Not on file  Physical Activity: Not on file  Stress: Not on file  Social Connections: Not on file  Intimate Partner Violence: Not on file    Tobacco Use: Low Risk  (09/04/2022)   Patient History    Smoking Tobacco Use: Never    Smokeless Tobacco Use: Never    Passive Exposure: Never   Social History   Substance and Sexual Activity  Alcohol Use No    Family History  Problem Relation Age of Onset   Lung cancer Brother    Stroke Mother    Pneumonia Father    Cancer Brother     Review of Systems  Constitutional:  Negative for chills and fever.  HENT: Negative.    Eyes:  Negative.   Respiratory:  Negative for cough and shortness of breath.   Cardiovascular:  Negative for chest pain and palpitations.  Gastrointestinal:  Negative for abdominal pain, constipation, diarrhea, nausea and vomiting.  Genitourinary:  Negative for dysuria, frequency and urgency.  Musculoskeletal:  Positive for joint pain.  Skin:  Negative for rash.   Objective:  Physical Exam: Well nourished and well developed.  General: Alert and oriented x3, cooperative and pleasant, no acute distress.  Head: normocephalic, atraumatic, neck supple.  Eyes: EOMI. Abdomen: non-tender to palpation and soft, normoactive bowel sounds. Musculoskeletal:  Left Knee Exam: No effusion present. Varus deformity. The range of motion is: 5 to 115 degrees. Marked crepitus on range of motion of the knee. Positive medial greater than lateral joint line tenderness. The knee is stable.  Calves soft and nontender. Motor function intact in LE. Strength 5/5 LE bilaterally. Neuro: Distal pulses 2+. Sensation to light touch intact in LE.  Vital signs in last 24 hours:    Imaging Review Plain radiographs demonstrate moderate degenerative joint disease of the left knee. The overall alignment is mild varus. The bone quality appears to be adequate for age and reported activity level.  Assessment/Plan:  End stage arthritis, left knee   The patient history, physical examination, clinical judgment of the provider and imaging studies are consistent with end stage degenerative joint disease of the left knee and total knee arthroplasty is deemed medically necessary. The treatment options including medical management, injection therapy arthroscopy and arthroplasty were discussed at length. The risks and benefits of total knee arthroplasty were presented and reviewed. The risks due to aseptic loosening, infection, stiffness, patella tracking problems, thromboembolic complications and other imponderables were discussed. The  patient acknowledged the explanation, agreed to proceed with the plan and consent was signed. Patient is being admitted for inpatient treatment for surgery, pain control, PT, OT, prophylactic antibiotics, VTE prophylaxis, progressive ambulation and ADLs and discharge planning. The patient is planning to be discharged  home .  Patient's anticipated LOS is less than 2 midnights, meeting these requirements: - Lives within 1 hour of care - Has a competent adult at home to recover with post-op - NO history of  - Chronic pain requiring opioids  - Diabetes  - Coronary Artery Disease  -  Heart failure  - Heart attack  - Stroke  - DVT/VTE  - Respiratory Failure/COPD  - Renal failure  - Anemia  - Advanced Liver disease  Therapy Plans: Nicole Kindred PT [facility]) Disposition: Home with Daughter Planned DVT Prophylaxis: Eliquis (hx of a-fib) DME Needed: None PCP: Crist Infante, MD (clearance received) Cardiologist: Kela Millin, MD (clearance received) TXA: IV Allergies: codeine, penicillin, levaquin Anesthesia Concerns: None BMI: 27.5 Last HgbA1c: not diabetic  Ph[facility] [place])  Other: -Per cardiologist, stop Eliquis 2 days prior to surgery -only tramadol and methocarbamol with right TKA -Husband passed away 25-Jan-2023 - may need to reschedule. Will call to let us know if so, at which point I told patient I would talk to Dr. Wynelle Link to see when he could reschedule her given circumstances  - Patient was instructed on what medications to stop prior to surgery. - Follow-up visit in 2 weeks with Dr. Wynelle Link - Begin physical therapy following surgery - Pre-operative lab work as pre-surgical testing - Prescriptions will be provided in hospital at time of discharge  R. Jaynie Bream, PA-C Orthopedic Surgery [facility] [place]

## 2022-09-15 NOTE — Patient Instructions (Signed)
DUE TO COVID-19 ONLY TWO VISITORS  (aged 85 and older)  ARE ALLOWED TO COME WITH YOU AND STAY IN THE WAITING ROOM ONLY DURING PRE OP AND PROCEDURE.   **NO VISITORS ARE ALLOWED IN THE SHORT STAY AREA OR RECOVERY ROOM!!**  IF YOU WILL BE ADMITTED INTO THE HOSPITAL YOU ARE ALLOWED ONLY FOUR SUPPORT PEOPLE DURING VISITATION HOURS ONLY (7 AM -8PM)   The support person(s) must pass our screening, gel in and out, and wear a mask at all times, including in the patient's room. Patients must also wear a mask when staff or their support person are in the room. Visitors GUEST BADGE MUST BE WORN VISIBLY  One adult visitor may remain with you overnight and MUST be in the room by 8 P.M.     Your procedure is scheduled on: 09/22/22   Report to [facility] Main Entrance    Report to admitting at : 9:20 AM   Call this number if you have problems the morning of surgery [phone]   Do not eat food :After Midnight.   After Midnight you may have the following liquids until : 8:45 AM DAY OF SURGERY  Water Black Coffee (sugar ok, NO MILK/CREAM OR CREAMERS)  Tea (sugar ok, NO MILK/CREAM OR CREAMERS) regular and decaf                             Plain Jell-O (NO RED)                                           Fruit ices (not with fruit pulp, NO RED)                                     Popsicles (NO RED)                                                                  Juice: apple, WHITE grape, WHITE cranberry Sports drinks like Gatorade (NO RED)   The day of surgery:  Drink ONE (1) Pre-Surgery Clear Ensure or G2 at: 8:45 AM the morning of surgery. Drink in one sitting. Do not sip.  This drink was given to you during your hospital  pre-op appointment visit. Nothing else to drink after completing the  Pre-Surgery Clear Ensure or G2.          If you have questions, please contact your surgeon's office.  Oral Hygiene is also important to reduce your risk of infection.                                     Remember - BRUSH YOUR TEETH THE MORNING OF SURGERY WITH YOUR REGULAR TOOTHPASTE   Do NOT smoke after Midnight   Take these medicines the morning of surgery with A SIP OF WATER: loratadine,atenolol,allopurinol.  You may not have any metal on your body including hair pins, jewelry, and body piercing             Do not wear make-up, lotions, powders, perfumes/cologne, or deodorant  Do not wear nail polish including gel and S&S, artificial/acrylic nails, or any other type of covering on natural nails including finger and toenails. If you have artificial nails, gel coating, etc. that needs to be removed by a nail salon please have this removed prior to surgery or surgery may need to be canceled/ delayed if the surgeon/ anesthesia feels like they are unable to be safely monitored.   Do not shave  48 hours prior to surgery.    Do not bring valuables to the hospital. [place].   Contacts, dentures or bridgework may not be worn into surgery.   Bring small overnight bag day of surgery.   DO NOT [place]. PHARMACY WILL DISPENSE MEDICATIONS LISTED ON YOUR MEDICATION LIST TO YOU DURING YOUR ADMISSION [place]!    Patients discharged on the day of surgery will not be allowed to drive home.  Someone NEEDS to stay with you for the first 24 hours after anesthesia.   Special Instructions: Bring a copy of your healthcare power of attorney and living will documents         the day of surgery if you haven't scanned them before.              Please read over the following fact sheets you were given: IF YOU HAVE QUESTIONS ABOUT YOUR PRE-OP INSTRUCTIONS PLEASE CALL [phone]    [facility] - Preparing for Surgery Before surgery, you can play an important role.  Because skin is not sterile, your skin needs to be as free of germs as possible.  You can reduce the number of germs on  your skin by washing with CHG (chlorahexidine gluconate) soap before surgery.  CHG is an antiseptic cleaner which kills germs and bonds with the skin to continue killing germs even after washing. Please DO NOT use if you have an allergy to CHG or antibacterial soaps.  If your skin becomes reddened/irritated stop using the CHG and inform your nurse when you arrive at Short Stay. Do not shave (including legs and underarms) for at least 48 hours prior to the first CHG shower.  You may shave your face/neck. Please follow these instructions carefully:  1.  Shower with CHG Soap the night before surgery and the  morning of Surgery.  2.  If you choose to wash your hair, wash your hair first as usual with your  normal  shampoo.  3.  After you shampoo, rinse your hair and body thoroughly to remove the  shampoo.                           4.  Use CHG as you would any other liquid soap.  You can apply chg directly  to the skin and wash                       Gently with a scrungie or clean washcloth.  5.  Apply the CHG Soap to your body ONLY FROM THE NECK DOWN.   Do not use on face/ open  Wound or open sores. Avoid contact with eyes, ears mouth and genitals (private parts).                       Wash face,  Genitals (private parts) with your normal soap.             6.  Wash thoroughly, paying special attention to the area where your surgery  will be performed.  7.  Thoroughly rinse your body with warm water from the neck down.  8.  DO NOT shower/wash with your normal soap after using and rinsing off  the CHG Soap.                9.  Pat yourself dry with a clean towel.            10.  Wear clean pajamas.            11.  Place clean sheets on your bed the night of your first shower and do not  sleep with pets. Day of Surgery : Do not apply any lotions/deodorants the morning of surgery.  Please wear clean clothes to the hospital/surgery center.  FAILURE TO FOLLOW THESE INSTRUCTIONS MAY  RESULT IN THE CANCELLATION OF YOUR SURGERY PATIENT SIGNATURE_________________________________  NURSE SIGNATURE__________________________________  ________________________________________________________________________  Kathryn Ayala  An incentive spirometer is a tool that can help keep your lungs clear and active. This tool measures how well you are filling your lungs with each breath. Taking long deep breaths may help reverse or decrease the chance of developing breathing (pulmonary) problems (especially infection) following: A long period of time when you are unable to move or be active. BEFORE THE PROCEDURE  If the spirometer includes an indicator to show your best effort, your nurse or respiratory therapist will set it to a desired goal. If possible, sit up straight or lean slightly forward. Try not to slouch. Hold the incentive spirometer in an upright position. INSTRUCTIONS FOR USE  Sit on the edge of your bed if possible, or sit up as far as you can in bed or on a chair. Hold the incentive spirometer in an upright position. Breathe out normally. Place the mouthpiece in your mouth and seal your lips tightly around it. Breathe in slowly and as deeply as possible, raising the piston or the ball toward the top of the column. Hold your breath for 3-5 seconds or for as long as possible. Allow the piston or ball to fall to the bottom of the column. Remove the mouthpiece from your mouth and breathe out normally. Rest for a few seconds and repeat Steps 1 through 7 at least 10 times every 1-2 hours when you are awake. Take your time and take a few normal breaths between deep breaths. The spirometer may include an indicator to show your best effort. Use the indicator as a goal to work toward during each repetition. After each set of 10 deep breaths, practice coughing to be sure your lungs are clear. If you have an incision (the cut made at the time of surgery), support your incision when  coughing by placing a pillow or rolled up towels firmly against it. Once you are able to get out of bed, walk around indoors and cough well. You may stop using the incentive spirometer when instructed by your caregiver.  RISKS AND COMPLICATIONS Take your time so you do not get dizzy or light-headed. If you are in pain, you may need to take or ask  for pain medication before doing incentive spirometry. It is harder to take a deep breath if you are having pain. AFTER USE Rest and breathe slowly and easily. It can be helpful to keep track of a log of your progress. Your caregiver can provide you with a simple table to help with this. If you are using the spirometer at home, follow these instructions: [place] IF:  You are having difficultly using the spirometer. You have trouble using the spirometer as often as instructed. Your pain medication is not giving enough relief while using the spirometer. You develop fever of 100.5 F (38.1 C) or higher. SEEK IMMEDIATE MEDICAL CARE IF:  You cough up bloody sputum that had not been present before. You develop fever of 102 F (38.9 C) or greater. You develop worsening pain at or near the incision site. MAKE SURE YOU:  Understand these instructions. Will watch your condition. Will get help right away if you are not doing well or get worse. Document Released: 02/16/2007 Document Revised: 12/29/2011 Document Reviewed: 04/19/2007 [facility] Patient Information 2014 [place], [place].   ________________________________________________________________________

## 2022-09-16 ENCOUNTER — Encounter (HOSPITAL_COMMUNITY)
Admission: RE | Admit: 2022-09-16 | Discharge: 2022-09-16 | Disposition: A | Discharge status: Home / Self Care | Payer: Medicare PPO | Source: Ambulatory Visit | Attending: Orthopedic Surgery | Admitting: Orthopedic Surgery

## 2022-09-16 ENCOUNTER — Other Ambulatory Visit: Payer: Self-pay

## 2022-09-16 ENCOUNTER — Encounter (HOSPITAL_COMMUNITY): Payer: Self-pay

## 2022-09-16 VITALS — BP 135/75 | HR 78 | Temp 98.5°F | Ht 63.0 in | Wt 142.0 lb

## 2022-09-16 DIAGNOSIS — I10 Essential (primary) hypertension: Secondary | ICD-10-CM

## 2022-09-16 DIAGNOSIS — Z01818 Encounter for other preprocedural examination: Secondary | ICD-10-CM | POA: Diagnosis not present

## 2022-09-16 HISTORY — DX: Tachycardia, unspecified: R00.0

## 2022-09-16 HISTORY — DX: Personal history of urinary calculi: Z87.442

## 2022-09-16 LAB — BASIC METABOLIC PANEL
Anion gap: 11 (ref 5–15)
BUN: 26 mg/dL — ABNORMAL HIGH (ref 8–23)
CO2: 22 mmol/L (ref 22–32)
Calcium: 9.8 mg/dL (ref 8.9–10.3)
Chloride: 108 mmol/L (ref 98–111)
Creatinine, Ser: 0.86 mg/dL (ref 0.44–1.00)
GFR, Estimated: >60 mL/min (ref >60)
Glucose, Bld: 110 mg/dL — ABNORMAL HIGH (ref 70–99)
Potassium: 3.2 mmol/L — ABNORMAL LOW (ref 3.5–5.1)
Sodium: 141 mmol/L (ref 135–145)

## 2022-09-16 LAB — CBC
HCT: 43.2 % (ref 36.0–46.0)
Hemoglobin: 13.2 g/dL (ref 12.0–15.0)
MCH: 30.8 pg (ref 26.0–34.0)
MCHC: 30.6 g/dL (ref 30.0–36.0)
MCV: 100.7 fL — ABNORMAL HIGH (ref 80.0–100.0)
Platelets: 223 10*3/uL (ref 150–400)
RBC: 4.29 MIL/uL (ref 3.87–5.11)
RDW: 15.1 % (ref 11.5–15.5)
WBC: 5.2 10*3/uL (ref 4.0–10.5)
nRBC: 0 % (ref 0.0–0.2)

## 2022-09-16 LAB — SURGICAL PCR SCREEN
MRSA, PCR: NEGATIVE
Staphylococcus aureus: NEGATIVE

## 2022-09-16 NOTE — Progress Notes (Addendum)
For Short Stay: [place] appointment date:  Bowel Prep reminder:   For Anesthesia: PCP - Dr. Crist Infante: [place]: 09/04/22: Chart Cardiologist - Dr. Adrian Prows.: L[place]: 08/04/22  Chest x-ray -  EKG - 08/04/22 Stress Test -  ECHO - 07/24/22 Cardiac Cath -  Pacemaker/ICD device last checked: Pacemaker orders received: Device Rep notified:  Spinal Cord Stimulator:  Sleep Study -  CPAP -   Fasting Blood Sugar -  Checks Blood Sugar _____ times a day Date and result of last Hgb A1c-  Last dose of GLP1 agonist-  GLP1 instructions:   Last dose of SGLT-2 inhibitors-  SGLT-2 instructions:   Blood Thinner Instructions: Eliquis will be held on 09/18/22 after the night dose. Aspirin Instructions: Last Dose:  Activity level: Can go up a flight of stairs and activities of daily living without stopping and without chest pain and/or shortness of breath   Able to exercise without chest pain and/or shortness of breath   Unable to go up a flight of stairs without chest pain and/or shortness of breath     Anesthesia review: Hx: Afib,SAtroke,HTN  Patient denies shortness of breath, fever, cough and chest pain at PAT appointment   Patient verbalized understanding of instructions that were given to them at the PAT appointment. Patient was also instructed that they will need to review over the PAT instructions again at home before surgery.

## 2022-09-17 NOTE — Anesthesia Preprocedure Evaluation (Addendum)
Anesthesia Evaluation  Patient identified by MRN, date of birth, ID band Patient awake    Reviewed: Allergy & Precautions, NPO status , Patient's Chart, lab work & pertinent test results  Airway Mallampati: II  TM Distance: >3 FB Neck ROM: Full    Dental no notable dental hx.    Pulmonary neg pulmonary ROS   Pulmonary exam normal        Cardiovascular hypertension, Pt. on medications and Pt. on home beta blockers + Peripheral Vascular Disease  + dysrhythmias Atrial Fibrillation  Rhythm:Irregular Rate:Normal  Echocardiogram 07/24/2022:  Normal LV systolic function with visual EF 60-65%. Left ventricle cavity  is normal in size. Normal left ventricular wall thickness. Normal global  wall motion. Indeterminate diastolic filling pattern. Calculated EF 69%.  Left atrial cavity is severely dilated at 78.3 ml/m^2.  Right atrial cavity is severely dilated.  Right ventricle cavity is mildly dilated. Normal right ventricular  function.  Structurally normal trileaflet aortic valve.  Mild (Grade I) aortic  regurgitation.  Moderate to severe mitral regurgitation. Mild mitral valve leaflet  calcification.  Structurally normal tricuspid valve.  Severe tricuspid regurgitation.  Moderate pulmonary hypertension. RVSP measures 53 mmHg.  Structurally normal pulmonic valve.  Mild pulmonic regurgitation.  IVC is dilated with respiratory variation.  No significant change compared to 08/2021.     Neuro/Psych   Anxiety Depression    CVA    GI/Hepatic negative GI ROS, Neg liver ROS,,,  Endo/Other  negative endocrine ROS    Renal/GU   negative genitourinary   Musculoskeletal  (+) Arthritis , Osteoarthritis,  Fibromyalgia -  Abdominal Normal abdominal exam  (+)   Peds  Hematology  (+) Blood dyscrasia, anemia   Anesthesia Other Findings   Reproductive/Obstetrics                             Anesthesia  Physical Anesthesia Plan  ASA: 3  Anesthesia Plan: MAC, Regional and Spinal   Post-op Pain Management: Regional block*   Induction: Intravenous  PONV Risk Score and Plan: 2 and Ondansetron, Dexamethasone, Propofol infusion and Treatment may vary due to age or medical condition  Airway Management Planned: Simple Face Mask, Natural Airway and Nasal Cannula  Additional Equipment: None  Intra-op Plan:   Post-operative Plan:   Informed Consent: I have reviewed the patients History and Physical, chart, labs and discussed the procedure including the risks, benefits and alternatives for the proposed anesthesia with the patient or authorized representative who has indicated his/her understanding and acceptance.     Dental advisory given  Plan Discussed with: CRNA  Anesthesia Plan Comments: (See PAT note 09/16/2022)       Anesthesia Quick Evaluation

## 2022-09-17 NOTE — Progress Notes (Signed)
Anesthesia Chart Review   Case: [id number] Date/Time: 09/22/22 1135   Procedure: TOTAL KNEE ARTHROPLASTY (Left: Knee)   Anesthesia type: Choice   Pre-op diagnosis: Left knee osteoarthritis   Location: [facility] ROOM 09 / [facility]   Surgeons: Gaynelle Arabian, MD       DISCUSSION:87 y.o. never smoker with h/o HTN, atrial fibrillation, moderate-severe MR and TR with mild pulmonary HTN , left knee OA scheduled for above procedure 09/22/2022 with Dr. Gaynelle Arabian.   Pt last seen by cardiology 08/04/2022. Per OV note, "Patient underwent successful total knee arthroplasty on 04/07/2022 without periprocedural complications and now has been scheduled for left knee arthroplasty on 09/22/2022.  From cardiac standpoint she can be taken up for the surgery with low risk."  Pt reports last dose of Eliquis evening of 09/18/22.   Anticipate pt can proceed with planned procedure barring acute status change.   VS: BP 135/75   Pulse 78   Temp 36.9 C (Oral)   Ht '5\' 3"'$  (1.6 m)   Wt 64.4 kg   SpO2 98%   BMI 25.15 kg/m   PROVIDERS: Crist Infante, MD is PCP   Cardiologist - Dr. Adrian Prows  LABS: Labs reviewed: Acceptable for surgery. (all labs ordered are listed, but only abnormal results are displayed)  Labs Reviewed  BASIC METABOLIC PANEL - Abnormal; Notable for the following components:      Result Value   Potassium 3.2 (*)    Glucose, Bld 110 (*)    BUN 26 (*)    All other components within normal limits  CBC - Abnormal; Notable for the following components:   MCV 100.7 (*)    All other components within normal limits  SURGICAL PCR SCREEN     IMAGES:   EKG:   CV: Echocardiogram 07/24/2022:  Normal LV systolic function with visual EF 60-65%. Left ventricle cavity  is normal in size. Normal left ventricular wall thickness. Normal global  wall motion. Indeterminate diastolic filling pattern. Calculated EF 69%.  Left atrial cavity is severely dilated at 78.3 ml/m^2.  Right atrial cavity is  severely dilated.  Right ventricle cavity is mildly dilated. Normal right ventricular  function.  Structurally normal trileaflet aortic valve.  Mild (Grade I) aortic  regurgitation.  Moderate to severe mitral regurgitation. Mild mitral valve leaflet  calcification.  Structurally normal tricuspid valve.  Severe tricuspid regurgitation.  Moderate pulmonary hypertension. RVSP measures 53 mmHg.  Structurally normal pulmonic valve.  Mild pulmonic regurgitation.  IVC is dilated with respiratory variation.  No significant change compared to 08/2021.  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   Tachycardia    Urinary frequency     Past Surgical History:  Procedure Laterality Date   [place] Left 05/07/2016   BREAST BIOPSY Bilateral 1956, 1980   BREAST LUMPECTOMY WITH RADIOACTIVE SEED LOCALIZATION Left 06/16/2016   Procedure: LEFT BREAST LUMPECTOMY WITH RADIOACTIVE SEED LOCALIZATION;  Surgeon: Autumn Messing III, MD;  Location: [facility];  Service: General;  Laterality: Left;   COLONOSCOPY     KIDNEY STONE SURGERY     KNEE SURGERY  11/20/2004   TOTAL KNEE ARTHROPLASTY Right 04/07/2022   Procedure: TOTAL KNEE ARTHROPLASTY;  Surgeon: Gaynelle Arabian, MD;  Location[facility];  Service: Orthopedics;  Laterality: Right;   WRIST SURGERY Left 2018    MEDICATIONS:  acetaminophen (TYLENOL) 500 MG tablet   allopurinol (ZYLOPRIM) 100 MG tablet   atenolol (TENORMIN) 50 MG tablet   Biotin (BIOTIN 5000) 5 MG CAPS   Cholecalciferol (VITAMIN D3) 50 MCG (2000 UT) CAPS   ELIQUIS 5 MG TABS tablet   furosemide (LASIX) 20 MG  tablet   loratadine (CLARITIN) 10 MG tablet   methocarbamol (ROBAXIN) 500 MG tablet   rosuvastatin (CRESTOR) 5 MG tablet   traMADol (ULTRAM) 50 MG tablet   triamterene-hydrochlorothiazide (MAXZIDE-25) 37.5-25 MG tablet   venlafaxine XR (EFFEXOR-XR) 37.5 MG 24 hr capsule   vitamin B-12 (CYANOCOBALAMIN) 500 MCG tablet   vitamin E 180 MG (400 UNITS) capsule   No current facility-administered medications for this encounter.    Konrad Felix Ward, PA-C [facility] [phone]

## 2022-09-22 ENCOUNTER — Ambulatory Visit (HOSPITAL_COMMUNITY): Payer: Medicare PPO | Admitting: Physician Assistant

## 2022-09-22 ENCOUNTER — Other Ambulatory Visit: Payer: Self-pay

## 2022-09-22 ENCOUNTER — Encounter (HOSPITAL_COMMUNITY): Payer: Self-pay | Admitting: Orthopedic Surgery

## 2022-09-22 ENCOUNTER — Ambulatory Visit (HOSPITAL_BASED_OUTPATIENT_CLINIC_OR_DEPARTMENT_OTHER): Payer: Medicare PPO | Admitting: Certified Registered Nurse Anesthetist

## 2022-09-22 ENCOUNTER — Observation Stay (HOSPITAL_COMMUNITY)
Admission: RE | Admit: 2022-09-22 | Discharge: 2022-09-23 | Disposition: A | Discharge status: Home / Self Care | Payer: Medicare PPO | Source: Ambulatory Visit | Attending: Orthopedic Surgery | Admitting: Orthopedic Surgery

## 2022-09-22 ENCOUNTER — Encounter (HOSPITAL_COMMUNITY)
Admission: RE | Disposition: A | Discharge status: Home / Self Care | Payer: Self-pay | Source: Ambulatory Visit | Attending: Orthopedic Surgery

## 2022-09-22 DIAGNOSIS — Z8673 Personal history of transient ischemic attack (TIA), and cerebral infarction without residual deficits: Secondary | ICD-10-CM | POA: Insufficient documentation

## 2022-09-22 DIAGNOSIS — Z853 Personal history of malignant neoplasm of breast: Secondary | ICD-10-CM | POA: Insufficient documentation

## 2022-09-22 DIAGNOSIS — Z7901 Long term (current) use of anticoagulants: Secondary | ICD-10-CM | POA: Diagnosis not present

## 2022-09-22 DIAGNOSIS — F418 Other specified anxiety disorders: Secondary | ICD-10-CM

## 2022-09-22 DIAGNOSIS — M1712 Unilateral primary osteoarthritis, left knee: Secondary | ICD-10-CM

## 2022-09-22 DIAGNOSIS — Z79899 Other long term (current) drug therapy: Secondary | ICD-10-CM | POA: Insufficient documentation

## 2022-09-22 DIAGNOSIS — M797 Fibromyalgia: Secondary | ICD-10-CM

## 2022-09-22 DIAGNOSIS — I10 Essential (primary) hypertension: Secondary | ICD-10-CM | POA: Diagnosis not present

## 2022-09-22 DIAGNOSIS — N1831 Chronic kidney disease, stage 3a: Secondary | ICD-10-CM | POA: Insufficient documentation

## 2022-09-22 DIAGNOSIS — M170 Bilateral primary osteoarthritis of knee: Secondary | ICD-10-CM | POA: Diagnosis present

## 2022-09-22 DIAGNOSIS — G8918 Other acute postprocedural pain: Secondary | ICD-10-CM | POA: Diagnosis not present

## 2022-09-22 DIAGNOSIS — I4891 Unspecified atrial fibrillation: Secondary | ICD-10-CM | POA: Diagnosis not present

## 2022-09-22 DIAGNOSIS — I480 Paroxysmal atrial fibrillation: Secondary | ICD-10-CM | POA: Insufficient documentation

## 2022-09-22 DIAGNOSIS — I129 Hypertensive chronic kidney disease with stage 1 through stage 4 chronic kidney disease, or unspecified chronic kidney disease: Secondary | ICD-10-CM | POA: Insufficient documentation

## 2022-09-22 DIAGNOSIS — Z96651 Presence of right artificial knee joint: Secondary | ICD-10-CM | POA: Diagnosis not present

## 2022-09-22 HISTORY — PX: TOTAL KNEE ARTHROPLASTY: SHX125

## 2022-09-22 SURGERY — ARTHROPLASTY, KNEE, TOTAL
Anesthesia: Monitor Anesthesia Care | Site: Knee | Laterality: Left

## 2022-09-22 MED ORDER — SODIUM CHLORIDE (PF) 0.9 % IJ SOLN
INTRAMUSCULAR | Status: DC | PRN
  Administered 2022-09-22: 60 mL

## 2022-09-22 MED ORDER — PHENYLEPHRINE HCL-NACL 20-0.9 MG/250ML-% IV SOLN
INTRAVENOUS | Status: DC | PRN
  Administered 2022-09-22: 30 ug/min via INTRAVENOUS

## 2022-09-22 MED ORDER — PROPOFOL 10 MG/ML IV BOLUS
INTRAVENOUS | Status: DC | PRN
  Administered 2022-09-22 (×2): 10 mg via INTRAVENOUS
  Administered 2022-09-22: 30 mg via INTRAVENOUS

## 2022-09-22 MED ORDER — BISACODYL 10 MG RE SUPP: 10.0000 mg | Freq: Every day | RECTAL | Status: DC | PRN

## 2022-09-22 MED ORDER — DEXAMETHASONE SODIUM PHOSPHATE 10 MG/ML IJ SOLN
INTRAMUSCULAR | Status: AC
  Filled 2022-09-22: qty 1

## 2022-09-22 MED ORDER — TRAMADOL HCL 50 MG PO TABS
50.0000 mg | ORAL_TABLET | Freq: Four times a day (QID) | ORAL | Status: DC | PRN
  Administered 2022-09-22 – 2022-09-23 (×4): 100 mg via ORAL
  Filled 2022-09-22 (×4): qty 2

## 2022-09-22 MED ORDER — VANCOMYCIN HCL IN DEXTROSE 1-5 GM/200ML-% IV SOLN
1000.0000 mg | Freq: Two times a day (BID) | INTRAVENOUS | Status: AC
  Administered 2022-09-22: 1000 mg via INTRAVENOUS
  Filled 2022-09-22: qty 200

## 2022-09-22 MED ORDER — PHENOL 1.4 % MT LIQD: 1.0000 | OROMUCOSAL | Status: DC | PRN

## 2022-09-22 MED ORDER — APIXABAN 2.5 MG PO TABS
2.5000 mg | ORAL_TABLET | Freq: Two times a day (BID) | ORAL | Status: DC
  Administered 2022-09-23: 2.5 mg via ORAL
  Filled 2022-09-22: qty 1

## 2022-09-22 MED ORDER — ESMOLOL HCL 100 MG/10ML IV SOLN
INTRAVENOUS | Status: DC | PRN
  Administered 2022-09-22: 30 mg via INTRAVENOUS

## 2022-09-22 MED ORDER — BUPIVACAINE LIPOSOME 1.3 % IJ SUSP
INTRAMUSCULAR | Status: DC | PRN
  Administered 2022-09-22: 20 mL

## 2022-09-22 MED ORDER — ACETAMINOPHEN 500 MG PO TABS
1000.0000 mg | ORAL_TABLET | Freq: Four times a day (QID) | ORAL | Status: DC
  Administered 2022-09-22 – 2022-09-23 (×3): 1000 mg via ORAL
  Filled 2022-09-22 (×3): qty 2

## 2022-09-22 MED ORDER — PHENYLEPHRINE HCL (PRESSORS) 10 MG/ML IV SOLN
INTRAVENOUS | Status: AC
  Filled 2022-09-22: qty 1

## 2022-09-22 MED ORDER — LORATADINE 10 MG PO TABS
10.0000 mg | ORAL_TABLET | Freq: Every day | ORAL | Status: DC
  Administered 2022-09-23: 10 mg via ORAL
  Filled 2022-09-22: qty 1

## 2022-09-22 MED ORDER — VANCOMYCIN HCL IN DEXTROSE 1-5 GM/200ML-% IV SOLN
1000.0000 mg | INTRAVENOUS | Status: AC
  Administered 2022-09-22: 1000 mg via INTRAVENOUS
  Filled 2022-09-22: qty 200

## 2022-09-22 MED ORDER — PHENYLEPHRINE 80 MCG/ML (10ML) SYRINGE FOR IV PUSH (FOR BLOOD PRESSURE SUPPORT)
PREFILLED_SYRINGE | INTRAVENOUS | Status: DC | PRN
  Administered 2022-09-22: 80 ug via INTRAVENOUS

## 2022-09-22 MED ORDER — LACTATED RINGERS IV SOLN: INTRAVENOUS | Status: DC

## 2022-09-22 MED ORDER — DEXAMETHASONE SODIUM PHOSPHATE 10 MG/ML IJ SOLN
10.0000 mg | Freq: Once | INTRAMUSCULAR | Status: AC
  Administered 2022-09-23: 10 mg via INTRAVENOUS
  Filled 2022-09-22: qty 1

## 2022-09-22 MED ORDER — METHOCARBAMOL 500 MG PO TABS
500.0000 mg | ORAL_TABLET | Freq: Four times a day (QID) | ORAL | Status: DC | PRN
  Administered 2022-09-22 – 2022-09-23 (×4): 500 mg via ORAL
  Filled 2022-09-22 (×4): qty 1

## 2022-09-22 MED ORDER — HYDROMORPHONE HCL 1 MG/ML IJ SOLN: 0.5000 mg | INTRAMUSCULAR | Status: DC | PRN

## 2022-09-22 MED ORDER — ONDANSETRON HCL 4 MG/2ML IJ SOLN: 4.0000 mg | Freq: Four times a day (QID) | INTRAMUSCULAR | Status: DC | PRN

## 2022-09-22 MED ORDER — ONDANSETRON HCL 4 MG/2ML IJ SOLN
INTRAMUSCULAR | Status: DC | PRN
  Administered 2022-09-22: 4 mg via INTRAVENOUS

## 2022-09-22 MED ORDER — ATENOLOL 50 MG PO TABS
50.0000 mg | ORAL_TABLET | Freq: Two times a day (BID) | ORAL | Status: DC
  Administered 2022-09-22 – 2022-09-23 (×2): 50 mg via ORAL
  Filled 2022-09-22 (×2): qty 1

## 2022-09-22 MED ORDER — TRIAMTERENE-HCTZ 37.5-25 MG PO TABS
1.0000 | ORAL_TABLET | Freq: Every day | ORAL | Status: DC
  Administered 2022-09-23: 1 via ORAL
  Filled 2022-09-22: qty 1

## 2022-09-22 MED ORDER — METOCLOPRAMIDE HCL 5 MG PO TABS: 5.0000 mg | ORAL_TABLET | Freq: Three times a day (TID) | ORAL | Status: DC | PRN

## 2022-09-22 MED ORDER — FENTANYL CITRATE PF 50 MCG/ML IJ SOSY: 50.0000 ug | PREFILLED_SYRINGE | INTRAMUSCULAR | Status: DC

## 2022-09-22 MED ORDER — DEXAMETHASONE SODIUM PHOSPHATE 10 MG/ML IJ SOLN
8.0000 mg | Freq: Once | INTRAMUSCULAR | Status: AC
  Administered 2022-09-22: 4 mg via INTRAVENOUS

## 2022-09-22 MED ORDER — POLYETHYLENE GLYCOL 3350 17 G PO PACK: 17.0000 g | PACK | Freq: Every day | ORAL | Status: DC | PRN

## 2022-09-22 MED ORDER — BUPIVACAINE LIPOSOME 1.3 % IJ SUSP
INTRAMUSCULAR | Status: AC
  Filled 2022-09-22: qty 20

## 2022-09-22 MED ORDER — ROPIVACAINE HCL 7.5 MG/ML IJ SOLN
INTRAMUSCULAR | Status: DC | PRN
  Administered 2022-09-22: 20 mL via PERINEURAL

## 2022-09-22 MED ORDER — ONDANSETRON HCL 4 MG PO TABS: 4.0000 mg | ORAL_TABLET | Freq: Four times a day (QID) | ORAL | Status: DC | PRN

## 2022-09-22 MED ORDER — ONDANSETRON HCL 4 MG/2ML IJ SOLN
INTRAMUSCULAR | Status: AC
  Filled 2022-09-22: qty 2

## 2022-09-22 MED ORDER — SODIUM CHLORIDE (PF) 0.9 % IJ SOLN
INTRAMUSCULAR | Status: AC
  Filled 2022-09-22: qty 50

## 2022-09-22 MED ORDER — ORAL CARE MOUTH RINSE
15.0000 mL | Freq: Once | OROMUCOSAL | Status: AC
  Administered 2022-09-22: 15 mL via OROMUCOSAL

## 2022-09-22 MED ORDER — PROPOFOL 500 MG/50ML IV EMUL
INTRAVENOUS | Status: DC | PRN
  Administered 2022-09-22: 50 ug/kg/min via INTRAVENOUS

## 2022-09-22 MED ORDER — POVIDONE-IODINE 10 % EX SWAB
2.0000 | Freq: Once | CUTANEOUS | Status: AC
  Administered 2022-09-22: 2 via TOPICAL

## 2022-09-22 MED ORDER — MIDAZOLAM HCL 2 MG/2ML IJ SOLN: 1.0000 mg | INTRAMUSCULAR | Status: DC

## 2022-09-22 MED ORDER — SODIUM CHLORIDE 0.9 % IR SOLN
Status: DC | PRN
  Administered 2022-09-22 (×2): 1000 mL

## 2022-09-22 MED ORDER — PROPOFOL 1000 MG/100ML IV EMUL
INTRAVENOUS | Status: AC
  Filled 2022-09-22: qty 200

## 2022-09-22 MED ORDER — SODIUM CHLORIDE (PF) 0.9 % IJ SOLN
INTRAMUSCULAR | Status: AC
  Filled 2022-09-22: qty 10

## 2022-09-22 MED ORDER — METHOCARBAMOL 500 MG IVPB - SIMPLE MED: 500.0000 mg | Freq: Four times a day (QID) | INTRAVENOUS | Status: DC | PRN

## 2022-09-22 MED ORDER — PROPOFOL 1000 MG/100ML IV EMUL
INTRAVENOUS | Status: AC
  Filled 2022-09-22: qty 100

## 2022-09-22 MED ORDER — TRANEXAMIC ACID-NACL 1000-0.7 MG/100ML-% IV SOLN
1000.0000 mg | INTRAVENOUS | Status: AC
  Administered 2022-09-22: 1000 mg via INTRAVENOUS
  Filled 2022-09-22: qty 100

## 2022-09-22 MED ORDER — CHLORHEXIDINE GLUCONATE 0.12 % MT SOLN: 15.0000 mL | Freq: Once | OROMUCOSAL | Status: AC

## 2022-09-22 MED ORDER — MIDAZOLAM HCL 2 MG/2ML IJ SOLN
INTRAMUSCULAR | Status: AC
  Filled 2022-09-22: qty 2

## 2022-09-22 MED ORDER — SODIUM CHLORIDE 0.9 % IV SOLN: INTRAVENOUS | Status: DC

## 2022-09-22 MED ORDER — DEXAMETHASONE SODIUM PHOSPHATE 10 MG/ML IJ SOLN
INTRAMUSCULAR | Status: DC | PRN
  Administered 2022-09-22: 5 mg

## 2022-09-22 MED ORDER — DIPHENHYDRAMINE HCL 12.5 MG/5ML PO ELIX: 12.5000 mg | ORAL_SOLUTION | ORAL | Status: DC | PRN

## 2022-09-22 MED ORDER — PROPOFOL 10 MG/ML IV BOLUS
INTRAVENOUS | Status: AC
  Filled 2022-09-22: qty 20

## 2022-09-22 MED ORDER — FENTANYL CITRATE PF 50 MCG/ML IJ SOSY
PREFILLED_SYRINGE | INTRAMUSCULAR | Status: AC
  Administered 2022-09-22: 50 ug via INTRAVENOUS
  Filled 2022-09-22: qty 2

## 2022-09-22 MED ORDER — ROSUVASTATIN CALCIUM 5 MG PO TABS
5.0000 mg | ORAL_TABLET | Freq: Every day | ORAL | Status: DC
  Administered 2022-09-23: 5 mg via ORAL
  Filled 2022-09-22: qty 1

## 2022-09-22 MED ORDER — MENTHOL 3 MG MT LOZG
1.0000 | LOZENGE | OROMUCOSAL | Status: DC | PRN
  Administered 2022-09-22: 3 mg via ORAL
  Filled 2022-09-22: qty 9

## 2022-09-22 MED ORDER — ALLOPURINOL 100 MG PO TABS
100.0000 mg | ORAL_TABLET | Freq: Two times a day (BID) | ORAL | Status: DC
  Administered 2022-09-22 – 2022-09-23 (×2): 100 mg via ORAL
  Filled 2022-09-22 (×2): qty 1

## 2022-09-22 MED ORDER — FLEET ENEMA 7-19 GM/118ML RE ENEM: 1.0000 | ENEMA | Freq: Once | RECTAL | Status: DC | PRN

## 2022-09-22 MED ORDER — DOCUSATE SODIUM 100 MG PO CAPS
100.0000 mg | ORAL_CAPSULE | Freq: Two times a day (BID) | ORAL | Status: DC
  Administered 2022-09-22 – 2022-09-23 (×2): 100 mg via ORAL
  Filled 2022-09-22 (×2): qty 1

## 2022-09-22 MED ORDER — BUPIVACAINE LIPOSOME 1.3 % IJ SUSP: 20.0000 mL | Freq: Once | INTRAMUSCULAR | Status: DC

## 2022-09-22 MED ORDER — BUPIVACAINE IN DEXTROSE 0.75-8.25 % IT SOLN
INTRATHECAL | Status: DC | PRN
  Administered 2022-09-22: 1.6 mL via INTRATHECAL

## 2022-09-22 MED ORDER — METOCLOPRAMIDE HCL 5 MG/ML IJ SOLN: 5.0000 mg | Freq: Three times a day (TID) | INTRAMUSCULAR | Status: DC | PRN

## 2022-09-22 MED ORDER — ACETAMINOPHEN 10 MG/ML IV SOLN
1000.0000 mg | Freq: Four times a day (QID) | INTRAVENOUS | Status: DC
  Administered 2022-09-22: 1000 mg via INTRAVENOUS
  Filled 2022-09-22: qty 100

## 2022-09-22 MED ORDER — VENLAFAXINE HCL ER 37.5 MG PO CP24
37.5000 mg | ORAL_CAPSULE | Freq: Every day | ORAL | Status: DC
  Administered 2022-09-22 – 2022-09-23 (×2): 37.5 mg via ORAL
  Filled 2022-09-22 (×2): qty 1

## 2022-09-22 SURGICAL SUPPLY — 53 items
ATTUNE PSFEM LTSZ5 NARCEM KNEE (Femur) IMPLANT
ATTUNE PSRP INSR SZ5 8 KNEE (Insert) IMPLANT
BAG COUNTER SPONGE SURGICOUNT (BAG) IMPLANT
BAG ZIPLOCK 12X15 (MISCELLANEOUS) ×1 IMPLANT
BASEPLATE TIBIAL ROTATING SZ 4 (Knees) IMPLANT
BLADE SAG 18X100X1.27 (BLADE) ×1 IMPLANT
BLADE SAW SGTL 11.0X1.19X90.0M (BLADE) ×1 IMPLANT
BNDG ELASTIC 6X5.8 VLCR STR LF (GAUZE/BANDAGES/DRESSINGS) ×1 IMPLANT
BOWL SMART MIX CTS (DISPOSABLE) ×1 IMPLANT
CEMENT HV SMART SET (Cement) ×2 IMPLANT
COVER SURGICAL LIGHT HANDLE (MISCELLANEOUS) ×1 IMPLANT
CUFF TOURN SGL QUICK 34 (TOURNIQUET CUFF) ×1
CUFF TRNQT CYL 34X4.125X (TOURNIQUET CUFF) ×1 IMPLANT
DRAPE INCISE IOBAN 66X45 STRL (DRAPES) ×1 IMPLANT
DRAPE U-SHAPE 47X51 STRL (DRAPES) ×1 IMPLANT
DRSG AQUACEL AG ADV 3.5X10 (GAUZE/BANDAGES/DRESSINGS) ×1 IMPLANT
DURAPREP 26ML APPLICATOR (WOUND CARE) ×1 IMPLANT
ELECT REM PT RETURN 15FT ADLT (MISCELLANEOUS) ×1 IMPLANT
GLOVE BIO SURGEON STRL SZ 6.5 (GLOVE) IMPLANT
GLOVE BIO SURGEON STRL SZ7.5 (GLOVE) IMPLANT
GLOVE BIO SURGEON STRL SZ8 (GLOVE) ×1 IMPLANT
GLOVE BIOGEL PI IND STRL 6.5 (GLOVE) IMPLANT
GLOVE BIOGEL PI IND STRL 7.0 (GLOVE) IMPLANT
GLOVE BIOGEL PI IND STRL 8 (GLOVE) ×1 IMPLANT
GOWN STRL REUS W/ TWL LRG LVL3 (GOWN DISPOSABLE) ×1 IMPLANT
GOWN STRL REUS W/ TWL XL LVL3 (GOWN DISPOSABLE) IMPLANT
GOWN STRL REUS W/TWL LRG LVL3 (GOWN DISPOSABLE) ×1
GOWN STRL REUS W/TWL XL LVL3 (GOWN DISPOSABLE)
HANDPIECE INTERPULSE COAX TIP (DISPOSABLE) ×1
HOLDER FOLEY CATH W/STRAP (MISCELLANEOUS) IMPLANT
IMMOBILIZER KNEE 20 (SOFTGOODS) ×1
IMMOBILIZER KNEE 20 THIGH 36 (SOFTGOODS) ×1 IMPLANT
KIT TURNOVER KIT A (KITS) IMPLANT
MANIFOLD NEPTUNE II (INSTRUMENTS) ×1 IMPLANT
NS IRRIG 1000ML POUR BTL (IV SOLUTION) ×1 IMPLANT
PACK TOTAL KNEE CUSTOM (KITS) ×1 IMPLANT
PADDING CAST COTTON 6X4 STRL (CAST SUPPLIES) ×2 IMPLANT
PATELLA MEDIAL ATTUN 35MM KNEE (Knees) IMPLANT
PIN STEINMAN FIXATION KNEE (PIN) IMPLANT
PROTECTOR NERVE ULNAR (MISCELLANEOUS) ×1 IMPLANT
SET HNDPC FAN SPRY TIP SCT (DISPOSABLE) ×1 IMPLANT
SPIKE FLUID TRANSFER (MISCELLANEOUS) ×1 IMPLANT
STRIP CLOSURE SKIN 1/2X4 (GAUZE/BANDAGES/DRESSINGS) ×2 IMPLANT
SUT MNCRL AB 4-0 PS2 18 (SUTURE) ×1 IMPLANT
SUT STRATAFIX 0 PDS 27 VIOLET (SUTURE) ×1
SUT VIC AB 2-0 CT1 27 (SUTURE) ×4
SUT VIC AB 2-0 CT1 TAPERPNT 27 (SUTURE) ×3 IMPLANT
SUTURE STRATFX 0 PDS 27 VIOLET (SUTURE) ×1 IMPLANT
TRAY FOLEY MTR SLVR 14FR STAT (SET/KITS/TRAYS/PACK) IMPLANT
TRAY FOLEY MTR SLVR 16FR STAT (SET/KITS/TRAYS/PACK) ×1 IMPLANT
TUBE SUCTION HIGH CAP CLEAR NV (SUCTIONS) ×1 IMPLANT
WATER STERILE IRR 1000ML POUR (IV SOLUTION) ×2 IMPLANT
WRAP KNEE MAXI GEL POST OP (GAUZE/BANDAGES/DRESSINGS) ×1 IMPLANT

## 2022-09-22 NOTE — Anesthesia Procedure Notes (Addendum)
Spinal  Patient location during procedure: OR Start time: 09/22/2022 11:45 AM End time: 09/22/2022 11:47 AM Staffing Performed: anesthesiologist  Anesthesiologist: Darral Dash, DO Performed by: Darral Dash, DO Authorized by: Darral Dash, DO   Preanesthetic Checklist Completed: patient identified, IV checked, site marked, risks and benefits discussed, surgical consent, monitors and equipment checked, pre-op evaluation and timeout performed Spinal Block Patient position: sitting Prep: DuraPrep Patient monitoring: heart rate, cardiac monitor, continuous pulse ox and blood pressure Approach: midline Location: L3-4 Injection technique: single-shot Needle Needle type: Quincke  Needle gauge: 22 G Needle length: 12.7 cm Assessment Events: CSF return and second provider Additional Notes Patient identified. Risks/Benefits/Options discussed with patient including but not limited to bleeding, infection, nerve damage, paralysis, failed block, incomplete pain control, headache, blood pressure changes, nausea, vomiting, reactions to medications, itching and postpartum back pain. Confirmed with bedside nurse the patient's most recent platelet count. Confirmed with patient that they are not currently taking any anticoagulation, have any bleeding history or any family history of bleeding disorders. Patient expressed understanding and wished to proceed. All questions were answered. Sterile technique was used throughout the entire procedure. Please see nursing notes for vital signs. Warning signs of high block given to the patient including shortness of breath, tingling/numbness in hands, complete motor block, or any concerning symptoms with instructions to call for help. Patient was given instructions on fall risk and not to get out of bed. All questions and concerns addressed with instructions to call with any issues or inadequate analgesia.

## 2022-09-22 NOTE — Progress Notes (Signed)
Orthopedic Tech Progress Note Patient Details:  Kathryn Ayala [birth year] [id number]  CPM Left Knee Left Knee Flexion (Degrees): 40 Left Knee Extension (Degrees): 10  Post Interventions Patient Tolerated: Well  Kathryn Ayala Kathryn Ayala 09/22/2022, 2:13 PM

## 2022-09-22 NOTE — Care Plan (Signed)
Ortho Bundle Case Management Note  Patient Details  Name: Kathryn Ayala MRN: [id number] Date of Birth: [birth year]  L TKA on 09-22-22 DCP:  Home with dtr DME:  No needs, has a [facility] PT:  Nicole Kindred PT on 09-25-22                   DME Arranged:  N/A DME Agency:  NA  HH Arranged:  NA HH Agency:  NA  Additional Comments: Please contact me with any questions of if this plan should need to change.  Marianne Sofia, RN,CCM [facility]  [phone] 09/22/2022, 4:13 PM

## 2022-09-22 NOTE — Anesthesia Procedure Notes (Signed)
Anesthesia Regional Block: Adductor canal block   Pre-Anesthetic Checklist: , timeout performed,  Correct Patient, Correct Site, Correct Laterality,  Correct Procedure, Correct Position, site marked,  Risks and benefits discussed,  Surgical consent,  Pre-op evaluation,  At surgeon's request and post-op pain management  Laterality: Left  Prep: Dura Prep       Needles:  Injection technique: Single-shot  Needle Type: Echogenic Stimulator Needle     Needle Length: 10cm  Needle Gauge: 20     Additional Needles:   Procedures:,,,, ultrasound used (permanent image in chart),,    Narrative:  Start time: 09/22/2022 11:00 AM End time: 09/22/2022 11:14 AM Injection made incrementally with aspirations every 5 mL.  Performed by: Personally  Anesthesiologist: Darral Dash, DO  Additional Notes: Patient identified. Risks/Benefits/Options discussed with patient including but not limited to bleeding, infection, nerve damage, failed block, incomplete pain control. Patient expressed understanding and wished to proceed. All questions were answered. Sterile technique was used throughout the entire procedure. Please see nursing notes for vital signs. Aspirated in 5cc intervals with injection for negative confirmation. Patient was given instructions on fall risk and not to get out of bed. All questions and concerns addressed with instructions to call with any issues or inadequate analgesia.

## 2022-09-22 NOTE — Anesthesia Procedure Notes (Addendum)
Procedure Name: MAC Date/Time: 09/22/2022 11:32 AM  Performed by: West Pugh, CRNAPre-anesthesia Checklist: Patient identified, Emergency Drugs available, Suction available, Patient being monitored and Timeout performed Patient Re-evaluated:Patient Re-evaluated prior to induction Oxygen Delivery Method: Simple face mask Placement Confirmation: positive ETCO2 Dental Injury: Teeth and Oropharynx as per pre-operative assessment

## 2022-09-22 NOTE — Discharge Instructions (Signed)
 Frank Aluisio, MD Total Joint Specialist [facility] [place] [address] [place], [place] [address] [phone]  TOTAL KNEE REPLACEMENT POSTOPERATIVE DIRECTIONS    Knee Rehabilitation, Guidelines Following Surgery  Results after knee surgery are often greatly improved when you follow the exercise, range of motion and muscle strengthening exercises prescribed by your doctor. Safety measures are also important to protect the knee from further injury. If any of these exercises cause you to have increased pain or swelling in your knee joint, decrease the amount until you are comfortable again and slowly increase them. If you have problems or questions, call your caregiver or physical therapist for advice.   HOME CARE INSTRUCTIONS  Remove items at home which could result in a fall. This includes throw rugs or furniture in walking pathways.  ICE to the affected knee as much as tolerated. Icing helps control swelling. If the swelling is well controlled you will be more comfortable and rehab easier. Continue to use ice on the knee for pain and swelling from surgery. You may notice swelling that will progress down to the foot and ankle. This is normal after surgery. Elevate the leg when you are not up walking on it.    Continue to use the breathing machine which will help keep your temperature down. It is common for your temperature to cycle up and down following surgery, especially at night when you are not up moving around and exerting yourself. The breathing machine keeps your lungs expanded and your temperature down. Do not place pillow under the operative knee, focus on keeping the knee straight while resting  DIET You may resume your previous home diet once you are discharged from the hospital.  DRESSING / WOUND CARE / SHOWERING Keep your bulky bandage on for 2 days. On the third post-operative day you may remove the Ace bandage and gauze. There is a waterproof  adhesive bandage on your skin which will stay in place until your first follow-up appointment. Once you remove this you will not need to place another bandage You may begin showering 3 days following surgery, but do not submerge the incision under water.  ACTIVITY For the first 5 days, the key is rest and control of pain and swelling Do your home exercises twice a day starting on post-operative day 3. On the days you go to physical therapy, just do the home exercises once that day. You should rest, ice and elevate the leg for 50 minutes out of every hour. Get up and walk/stretch for 10 minutes per hour. After 5 days you can increase your activity slowly as tolerated. Walk with your walker as instructed. Use the walker until you are comfortable transitioning to a cane. Walk with the cane in the opposite hand of the operative leg. You may discontinue the cane once you are comfortable and walking steadily. Avoid periods of inactivity such as sitting longer than an hour when not asleep. This helps prevent blood clots.  You may discontinue the knee immobilizer once you are able to perform a straight leg raise while lying down. You may resume a sexual relationship in one month or when given the OK by your doctor.  You may return to work once you are cleared by your doctor.  Do not drive a car for 6 weeks or until released by your surgeon.  Do not drive while taking narcotics.  TED HOSE STOCKINGS Wear the elastic stockings on both legs for three weeks following surgery during the   day. You may remove them at night for sleeping.  WEIGHT BEARING Weight bearing as tolerated with assist device (walker, cane, etc) as directed, use it as long as suggested by your surgeon or therapist, typically at least 4-6 weeks.  POSTOPERATIVE CONSTIPATION PROTOCOL Constipation - defined medically as fewer than three stools per week and severe constipation as less than one stool per week.  One of the most common issues  patients have following surgery is constipation.  Even if you have a regular bowel pattern at home, your normal regimen is likely to be disrupted due to multiple reasons following surgery.  Combination of anesthesia, postoperative narcotics, change in appetite and fluid intake all can affect your bowels.  In order to avoid complications following surgery, here are some recommendations in order to help you during your recovery period.  Colace (docusate) - Pick up an over-the-counter form of Colace or another stool softener and take twice a day as long as you are requiring postoperative pain medications.  Take with a full glass of water daily.  If you experience loose stools or diarrhea, hold the colace until you stool forms back up. If your symptoms do not get better within 1 week or if they get worse, check with your doctor. Dulcolax (bisacodyl) - Pick up over-the-counter and take as directed by the product packaging as needed to assist with the movement of your bowels.  Take with a full glass of water.  Use this product as needed if not relieved by Colace only.  MiraLax (polyethylene glycol) - Pick up over-the-counter to have on hand. MiraLax is a solution that will increase the amount of water in your bowels to assist with bowel movements.  Take as directed and can mix with a glass of water, juice, soda, coffee, or tea. Take if you go more than two days without a movement. Do not use MiraLax more than once per day. Call your doctor if you are still constipated or irregular after using this medication for 7 days in a row.  If you continue to have problems with postoperative constipation, please contact the office for further assistance and recommendations.  If you experience "the worst abdominal pain ever" or develop nausea or vomiting, please contact the office immediatly for further recommendations for treatment.  ITCHING If you experience itching with your medications, try taking only a single pain  pill, or even half a pain pill at a time.  You can also use Benadryl over the counter for itching or also to help with sleep.   MEDICATIONS See your medication summary on the "After Visit Summary" that the nursing staff will review with you prior to discharge.  You may have some home medications which will be placed on hold until you complete the course of blood thinner medication.  It is important for you to complete the blood thinner medication as prescribed by your surgeon.  Continue your approved medications as instructed at time of discharge.  PRECAUTIONS If you experience chest pain or shortness of breath - call 911 immediately for transfer to the hospital emergency department.  If you develop a fever greater that 101 F, purulent drainage from wound, increased redness or drainage from wound, foul odor from the wound/dressing, or calf pain - CONTACT YOUR SURGEON.                                                     FOLLOW-UP APPOINTMENTS Make sure you keep all of your appointments after your operation with your surgeon and caregivers. You should call the office at the above phone number and make an appointment for approximately two weeks after the date of your surgery or on the date instructed by your surgeon outlined in the "After Visit Summary".  RANGE OF MOTION AND STRENGTHENING EXERCISES  Rehabilitation of the knee is important following a knee injury or an operation. After just a few days of immobilization, the muscles of the thigh which control the knee become weakened and shrink (atrophy). Knee exercises are designed to build up the tone and strength of the thigh muscles and to improve knee motion. Often times heat used for twenty to thirty minutes before working out will loosen up your tissues and help with improving the range of motion but do not use heat for the first two weeks following surgery. These exercises can be done on a training (exercise) mat, on the floor, on a table or on a bed.  Use what ever works the best and is most comfortable for you Knee exercises include:  Leg Lifts - While your knee is still immobilized in a splint or cast, you can do straight leg raises. Lift the leg to 60 degrees, hold for 3 sec, and slowly lower the leg. Repeat 10-20 times 2-3 times daily. Perform this exercise against resistance later as your knee gets better.  Quad and Hamstring Sets - Tighten up the muscle on the front of the thigh (Quad) and hold for 5-10 sec. Repeat this 10-20 times hourly. Hamstring sets are done by pushing the foot backward against an object and holding for 5-10 sec. Repeat as with quad sets.  Leg Slides: Lying on your back, slowly slide your foot toward your buttocks, bending your knee up off the floor (only go as far as is comfortable). Then slowly slide your foot back down until your leg is flat on the floor again. Angel Wings: Lying on your back spread your legs to the side as far apart as you can without causing discomfort.  A rehabilitation program following serious knee injuries can speed recovery and prevent re-injury in the future due to weakened muscles. Contact your doctor or a physical therapist for more information on knee rehabilitation.   POST-OPERATIVE OPIOID TAPER INSTRUCTIONS: It is important to wean off of your opioid medication as soon as possible. If you do not need pain medication after your surgery it is ok to stop day one. Opioids include: Codeine, Hydrocodone(Norco, Vicodin), Oxycodone(Percocet, oxycontin) and hydromorphone amongst others.  Long term and even short term use of opiods can cause: Increased pain response Dependence Constipation Depression Respiratory depression And more.  Withdrawal symptoms can include Flu like symptoms Nausea, vomiting And more Techniques to manage these symptoms Hydrate well Eat regular healthy meals Stay active Use relaxation techniques(deep breathing, meditating, yoga) Do Not substitute Alcohol to help  with tapering If you have been on opioids for less than two weeks and do not have pain than it is ok to stop all together.  Plan to wean off of opioids This plan should start within one week post op of your joint replacement. Maintain the same interval or time between taking each dose and first decrease the dose.  Cut the total daily intake of opioids by one tablet each day Next start to increase the time between doses. The last dose that should be eliminated is the evening dose.   IF YOU ARE TRANSFERRED TO   A SKILLED REHAB FACILITY If the patient is transferred to a skilled rehab facility following release from the hospital, a list of the current medications will be sent to the facility for the patient to continue.  When discharged from the skilled rehab facility, please have the facility set up the patient's Home Health Physical Therapy prior to being released. Also, the skilled facility will be responsible for providing the patient with their medications at time of release from the facility to include their pain medication, the muscle relaxants, and their blood thinner medication. If the patient is still at the rehab facility at time of the two week follow up appointment, the skilled rehab facility will also need to assist the patient in arranging follow up appointment in our office and any transportation needs.  MAKE SURE YOU:  Understand these instructions.  Get help right away if you are not doing well or get worse.   DENTAL ANTIBIOTICS:  In most cases prophylactic antibiotics for Dental procdeures after total joint surgery are not necessary.  Exceptions are as follows:  1. History of prior total joint infection  2. Severely immunocompromised (Organ Transplant, cancer chemotherapy, Rheumatoid biologic medications such as Humera)  3. Poorly controlled diabetes (A1C &gt; 8.0, blood glucose over 200)  If you have one of these conditions, contact your surgeon for an antibiotic  prescription, prior to your dental procedure.    Pick up stool softner and laxative for home use following surgery while on pain medications. Do not submerge incision under water. Please use good hand washing techniques while changing dressing each day. May shower starting three days after surgery. Please use a clean towel to pat the incision dry following showers. Continue to use ice for pain and swelling after surgery. Do not use any lotions or creams on the incision until instructed by your surgeon.  

## 2022-09-22 NOTE — Op Note (Signed)
OPERATIVE REPORT-TOTAL KNEE ARTHROPLASTY   Pre-operative diagnosis- Osteoarthritis  Left knee(s)  Post-operative diagnosis- Osteoarthritis Left knee(s)  Procedure-  Left  Total Knee Arthroplasty  Surgeon- Dione Plover. Andree Heeg, MD  Assistant- Raeford Razor, PA-C   Anesthesia-   Adductor canal block and spinal  EBL- 25 ml   Drains None  Tourniquet time-  Total Tourniquet Time Documented: Thigh (Left) - 32 minutes Total: Thigh (Left) - 32 minutes     Complications- None  Condition-PACU - hemodynamically stable.   Brief Clinical Note  Kathryn Ayala is a 87 y.o. year old female with end stage OA of her left knee with progressively worsening pain and dysfunction. She has constant pain, with activity and at rest and significant functional deficits with difficulties even with ADLs. She has had extensive non-op management including analgesics, injections of cortisone and viscosupplements, and home exercise program, but remains in significant pain with significant dysfunction. Radiographs show bone on bone arthritis medial and patelofemoral. She presents now for left Total Knee Arthroplasty.     Procedure in detail---   The patient is brought into the operating room and positioned supine on the operating table. After successful administration of  Adductor canal block and spinal,   a tourniquet is placed high on the  Left thigh(s) and the lower extremity is prepped and draped in the usual sterile fashion. Time out is performed by the operating team and then the  Left lower extremity is wrapped in Esmarch, knee flexed and the tourniquet inflated to 300 mmHg.       A midline incision is made with a ten blade through the subcutaneous tissue to the level of the extensor mechanism. A fresh blade is used to make a medial parapatellar arthrotomy. Soft tissue over the proximal medial tibia is subperiosteally elevated to the joint line with a knife and into the semimembranosus bursa with a Cobb  elevator. Soft tissue over the proximal lateral tibia is elevated with attention being paid to avoiding the patellar tendon on the tibial tubercle. The patella is everted, knee flexed 90 degrees and the ACL and PCL are removed. Findings are bone on bone medial and patellofemoral with large global osteophytes        The drill is used to create a starting hole in the distal femur and the canal is thoroughly irrigated with sterile saline to remove the fatty contents. The 5 degree Left  valgus alignment guide is placed into the femoral canal and the distal femoral cutting block is pinned to remove 9 mm off the distal femur. Resection is made with an oscillating saw.      The tibia is subluxed forward and the menisci are removed. The extramedullary alignment guide is placed referencing proximally at the medial aspect of the tibial tubercle and distally along the second metatarsal axis and tibial crest. The block is pinned to remove 26m off the more deficient medial  side. Resection is made with an oscillating saw. Size 4is the most appropriate size for the tibia and the proximal tibia is prepared with the modular drill and keel punch for that size.      The femoral sizing guide is placed and size 5 is most appropriate. Rotation is marked off the epicondylar axis and confirmed by creating a rectangular flexion gap at 90 degrees. The size 5 cutting block is pinned in this rotation and the anterior, posterior and chamfer cuts are made with the oscillating saw. The intercondylar block is then placed and that cut  is made.      Trial size 4 tibial component, trial size 5 narrow posterior stabilized femur and a 8  mm posterior stabilized rotating platform insert trial is placed. Full extension is achieved with excellent varus/valgus and anterior/posterior balance throughout full range of motion. The patella is everted and thickness measured to be 22  mm. Free hand resection is taken to 12 mm, a 35 template is placed, lug  holes are drilled, trial patella is placed, and it tracks normally. Osteophytes are removed off the posterior femur with the trial in place. All trials are removed and the cut bone surfaces prepared with pulsatile lavage. Cement is mixed and once ready for implantation, the size 4 tibial implant, size  5 narrow posterior stabilized femoral component, and the size 35 patella are cemented in place and the patella is held with the clamp. The trial insert is placed and the knee held in full extension. The Exparel (20 ml mixed with 60 ml saline) is injected into the extensor mechanism, posterior capsule, medial and lateral gutters and subcutaneous tissues.  All extruded cement is removed and once the cement is hard the permanent 8 mm posterior stabilized rotating platform insert is placed into the tibial tray.      The wound is copiously irrigated with saline solution and the extensor mechanism closed with # 0 Stratofix suture. The tourniquet is released for a total tourniquet time of 32  minutes. Flexion against gravity is 140 degrees and the patella tracks normally. Subcutaneous tissue is closed with 2.0 vicryl and subcuticular with running 4.0 Monocryl. The incision is cleaned and dried and steri-strips and a bulky sterile dressing are applied. The limb is placed into a knee immobilizer and the patient is awakened and transported to recovery in stable condition.      Please note that a surgical assistant was a medical necessity for this procedure in order to perform it in a safe and expeditious manner. Surgical assistant was necessary to retract the ligaments and vital neurovascular structures to prevent injury to them and also necessary for proper positioning of the limb to allow for anatomic placement of the prosthesis.   Dione Plover Maisyn Nouri, MD    09/22/2022, 12:51 PM

## 2022-09-22 NOTE — Interval H&P Note (Signed)
History and Physical Interval Note:  09/22/2022 9:26 AM  Kathryn Ayala  has presented today for surgery, with the diagnosis of Left knee osteoarthritis.  The various methods of treatment have been discussed with the patient and family. After consideration of risks, benefits and other options for treatment, the patient has consented to  Procedure(s): TOTAL KNEE ARTHROPLASTY (Left) as a surgical intervention.  The patient's history has been reviewed, patient examined, no change in status, stable for surgery.  I have reviewed the patient's chart and labs.  Questions were answered to the patient's satisfaction.     Pilar Plate Trayden Brandy

## 2022-09-22 NOTE — Evaluation (Signed)
Physical Therapy Evaluation Patient Details Name: Kathryn Ayala MRN: [id number] DOB: Feb 03, 1935 Today's Date: 09/22/2022  History of Present Illness  Pt is an 87yo female presenting s/p L-TKA on 09/22/22. PMH: afib, hx of breast cancer, HLD, HTN, PNA, hx of stroke (deaf R ear), R-TKA 04/07/22, CKD, PVD, gout, fibromyalgia.   Clinical Impression  Kathryn Ayala is a 87 y.o. female POD 0 s/p L-TKA. Patient reports modified independence using RW with mobility at baseline. Patient is now limited by functional impairments (see PT problem list below) and requires min guard for bed mobility and min assist for transfers. Patient was able to ambulate 15 feet with RW and min guard level of assist. Patient instructed in exercise to facilitate ROM and circulation to manage edema. Provided incentive spirometer and with Vcs pt able to achieve 1076m. Patient will benefit from continued skilled PT interventions to address impairments and progress towards PLOF. Acute PT will follow to progress mobility and HEP in preparation for safe discharge home.   Of Note: would appreciate MD & TOC input as pt is requesting HHPT secondary to transportation difficulties, thank you.     Recommendations for follow up therapy are one component of a multi-disciplinary discharge planning process, led by the attending physician.  Recommendations may be updated based on patient status, additional functional criteria and insurance authorization.  Follow Up Recommendations Follow physician's recommendations for discharge plan and follow up therapies (Pt would like to receive HHPT at first secondary to difficulty with transportation)      Assistance Recommended at Discharge Frequent or constant Supervision/Assistance  Patient can return home with the following  A little help with walking and/or transfers;A little help with bathing/dressing/bathroom;Assistance with cooking/housework;Assist for transportation;Help with stairs or  ramp for entrance    Equipment Recommendations None recommended by PT  Recommendations for Other Services       Functional Status Assessment Patient has had a recent decline in their functional status and demonstrates the ability to make significant improvements in function in a reasonable and predictable amount of time.     Precautions / Restrictions Precautions Precautions: Fall;Knee Precaution Booklet Issued: No Precaution Comments: no pillow under the knee Restrictions Weight Bearing Restrictions: No      Mobility  Bed Mobility Overal bed mobility: Needs Assistance Bed Mobility: Supine to Sit     Supine to sit: Min guard, HOB elevated     General bed mobility comments: For safety, provided gait belt so pt could self-asssist LLE off bed    Transfers Overall transfer level: Needs assistance Equipment used: Rolling walker (2 wheels) Transfers: Sit to/from Stand Sit to Stand: Min assist, From elevated surface           General transfer comment: Pt required min assist for steadying of pt and AD as she pulled up on RW despite cues to push up off bed.    Ambulation/Gait Ambulation/Gait assistance: Min guard, +2 safety/equipment Gait Distance (Feet): 15 Feet Assistive device: Rolling walker (2 wheels) Gait Pattern/deviations: Step-to pattern Gait velocity: decreased     General Gait Details: Pt ambulated with RW and min guard, no physical assist required or overt LOB noted, +2 for IV pole management only. Pt fearful of putting weight on LLE but with increased encouragement able to do so.  Stairs            Wheelchair Mobility    Modified Rankin (Stroke Patients Only)       Balance Overall balance assessment: Needs assistance Sitting-balance support:  Feet supported, No upper extremity supported Sitting balance-Leahy Scale: Good     Standing balance support: Reliant on assistive device for balance, During functional activity, Bilateral upper  extremity supported Standing balance-Leahy Scale: Poor                               Pertinent Vitals/Pain Pain Assessment Pain Assessment: 0-10 Pain Score: 10-Worst pain ever Pain Location: left knee Pain Descriptors / Indicators: Operative site guarding, Contraction, Cramping, Spasm Pain Intervention(s): Limited activity within patient's tolerance, Monitored during session, Repositioned, Patient requesting pain meds-RN notified, Ice applied    Home Living Family/patient expects to be discharged to:: Private residence Living Arrangements: Other relatives (Granddaughter) Available Help at Discharge: Family;Available 24 hours/day Type of Home: House Home Access: Ramped entrance       Home Layout: One level Home Equipment: Grab bars - tub/shower;Grab bars - toilet;Rolling Walker (2 wheels);Shower seat;Hand held shower head;BSC/3in1 Additional Comments: granddaugher lives with her, daughter Kathryn Ayala is an Therapist, sports    Prior Function Prior Level of Function : Independent/Modified Independent             Mobility Comments: RW ADLs Comments: IND     Hand Dominance        Extremity/Trunk Assessment   Upper Extremity Assessment Upper Extremity Assessment: Overall WFL for tasks assessed    Lower Extremity Assessment Lower Extremity Assessment: RLE deficits/detail;LLE deficits/detail RLE Deficits / Details: MMT ank DF/PF 4/5 RLE Sensation: WNL LLE Deficits / Details: MMT ank DF/PF 4/5, pt able to perform SLR barely, maybe 0.5" LLE Sensation: WNL    Cervical / Trunk Assessment Cervical / Trunk Assessment: Kyphotic  Communication   Communication: No difficulties  Cognition Arousal/Alertness: Awake/alert Behavior During Therapy: WFL for tasks assessed/performed Overall Cognitive Status: Within Functional Limits for tasks assessed                                          General Comments General comments (skin integrity, edema, etc.): Daughter  Kathryn Ayala present    Exercises Total Joint Exercises Ankle Circles/Pumps: AROM, Both, 10 reps   Assessment/Plan    PT Assessment Patient needs continued PT services  PT Problem List Decreased strength;Decreased range of motion;Decreased activity tolerance;Decreased balance;Decreased mobility;Decreased coordination;Pain       PT Treatment Interventions DME instruction;Gait training;Stair training;Functional mobility training;Therapeutic activities;Therapeutic exercise;Balance training;Neuromuscular re-education;Patient/family education    PT Goals (Current goals can be found in the Care Plan section)  Acute Rehab PT Goals Patient Stated Goal: to go home PT Goal Formulation: With patient Time For Goal Achievement: 09/29/22 Potential to Achieve Goals: Good    Frequency 7X/week     Co-evaluation               AM-PAC PT "6 Clicks" Mobility  Outcome Measure Help needed turning from your back to your side while in a flat bed without using bedrails?: None Help needed moving from lying on your back to sitting on the side of a flat bed without using bedrails?: A Little Help needed moving to and from a bed to a chair (including a wheelchair)?: A Little Help needed standing up from a chair using your arms (e.g., wheelchair or bedside chair)?: A Little Help needed to walk in hospital room?: A Little Help needed climbing 3-5 steps with a railing? : A Little 6 Click Score:  19    End of Session Equipment Utilized During Treatment: Gait belt Activity Tolerance: Patient tolerated treatment well;No increased pain Patient left: in chair;with call bell/phone within reach;with chair alarm set;with family/visitor present;with SCD's reapplied Nurse Communication: Mobility status PT Visit Diagnosis: Pain;Difficulty in walking, not elsewhere classified (R26.2) Pain - Right/Left: Left Pain - part of body: Knee    Time: 1810-1835 PT Time Calculation (min) (ACUTE ONLY): 25 min   Charges:    PT Evaluation $PT Eval Low Complexity: 1 Low PT Treatments $Gait Training: 8-22 mins        Coolidge Breeze, PT, DPT [facility] Office: [phone] Weekend pager: [phone]  Coolidge Breeze 09/22/2022, 6:40 PM

## 2022-09-22 NOTE — Anesthesia Postprocedure Evaluation (Signed)
Anesthesia Post Note  Patient: Kathryn Ayala  Procedure(s) Performed: TOTAL KNEE ARTHROPLASTY (Left: Knee)     Patient location during evaluation: PACU Anesthesia Type: Regional, MAC and Spinal Level of consciousness: awake and alert Pain management: pain level controlled Vital Signs Assessment: post-procedure vital signs reviewed and stable Respiratory status: spontaneous breathing, nonlabored ventilation, respiratory function stable and patient connected to nasal cannula oxygen Cardiovascular status: stable and blood pressure returned to baseline Postop Assessment: no apparent nausea or vomiting Anesthetic complications: no   No notable events documented.  Last Vitals:  Vitals:   09/22/22 1600 09/22/22 1648  BP: 129/87 (!) 165/85  Pulse: 98 98  Resp: 19   Temp:  (!) 36.4 C  SpO2: 98% 99%    Last Pain:  Vitals:   09/22/22 1648  TempSrc: Oral  PainSc:                  March Rummage Neena Beecham

## 2022-09-22 NOTE — Transfer of Care (Signed)
Immediate Anesthesia Transfer of Care Note  Patient: Kathryn Ayala  Procedure(s) Performed: TOTAL KNEE ARTHROPLASTY (Left: Knee)  Patient Location: PACU  Anesthesia Type:Spinal and MAC combined with regional for post-op pain  Level of Consciousness: awake, alert , and patient cooperative  Airway & Oxygen Therapy: Patient Spontanous Breathing and Patient connected to nasal cannula oxygen  Post-op Assessment: Report given to RN and Post -op Vital signs reviewed and stable  Post vital signs: Reviewed and stable  Last Vitals:  Vitals Value Taken Time  BP 127/96 09/22/22 1315  Temp    Pulse 81 09/22/22 1318  Resp 16 09/22/22 1318  SpO2 100 % 09/22/22 1318  Vitals shown include unvalidated device data.  Last Pain:  Vitals:   09/22/22 1112  TempSrc:   PainSc: 0-No pain         Complications: No notable events documented.

## 2022-09-23 ENCOUNTER — Encounter (HOSPITAL_COMMUNITY): Payer: Self-pay | Admitting: Orthopedic Surgery

## 2022-09-23 DIAGNOSIS — Z8673 Personal history of transient ischemic attack (TIA), and cerebral infarction without residual deficits: Secondary | ICD-10-CM | POA: Diagnosis not present

## 2022-09-23 DIAGNOSIS — Z853 Personal history of malignant neoplasm of breast: Secondary | ICD-10-CM | POA: Diagnosis not present

## 2022-09-23 DIAGNOSIS — Z79899 Other long term (current) drug therapy: Secondary | ICD-10-CM | POA: Diagnosis not present

## 2022-09-23 DIAGNOSIS — Z7901 Long term (current) use of anticoagulants: Secondary | ICD-10-CM | POA: Diagnosis not present

## 2022-09-23 DIAGNOSIS — Z96651 Presence of right artificial knee joint: Secondary | ICD-10-CM | POA: Diagnosis not present

## 2022-09-23 DIAGNOSIS — M1712 Unilateral primary osteoarthritis, left knee: Secondary | ICD-10-CM | POA: Diagnosis not present

## 2022-09-23 DIAGNOSIS — N1831 Chronic kidney disease, stage 3a: Secondary | ICD-10-CM | POA: Diagnosis not present

## 2022-09-23 DIAGNOSIS — I480 Paroxysmal atrial fibrillation: Secondary | ICD-10-CM | POA: Diagnosis not present

## 2022-09-23 DIAGNOSIS — I129 Hypertensive chronic kidney disease with stage 1 through stage 4 chronic kidney disease, or unspecified chronic kidney disease: Secondary | ICD-10-CM | POA: Diagnosis not present

## 2022-09-23 LAB — BASIC METABOLIC PANEL
Anion gap: 9 (ref 5–15)
BUN: 19 mg/dL (ref 8–23)
CO2: 24 mmol/L (ref 22–32)
Calcium: 9.5 mg/dL (ref 8.9–10.3)
Chloride: 103 mmol/L (ref 98–111)
Creatinine, Ser: 0.74 mg/dL (ref 0.44–1.00)
GFR, Estimated: >60 mL/min (ref >60)
Glucose, Bld: 154 mg/dL — ABNORMAL HIGH (ref 70–99)
Potassium: 4 mmol/L (ref 3.5–5.1)
Sodium: 136 mmol/L (ref 135–145)

## 2022-09-23 LAB — CBC
HCT: 36.8 % (ref 36.0–46.0)
Hemoglobin: 11.9 g/dL — ABNORMAL LOW (ref 12.0–15.0)
MCH: 31.7 pg (ref 26.0–34.0)
MCHC: 32.3 g/dL (ref 30.0–36.0)
MCV: 98.1 fL (ref 80.0–100.0)
Platelets: 205 10*3/uL (ref 150–400)
RBC: 3.75 MIL/uL — ABNORMAL LOW (ref 3.87–5.11)
RDW: 14.9 % (ref 11.5–15.5)
WBC: 9.8 10*3/uL (ref 4.0–10.5)
nRBC: 0 % (ref 0.0–0.2)

## 2022-09-23 MED ORDER — METHOCARBAMOL 500 MG PO TABS: 500.0000 mg | ORAL_TABLET | Freq: Four times a day (QID) | ORAL | 0 refills | Status: DC | PRN

## 2022-09-23 MED ORDER — TRAMADOL HCL 50 MG PO TABS: 50.0000 mg | ORAL_TABLET | Freq: Four times a day (QID) | ORAL | 0 refills | Status: DC | PRN

## 2022-09-23 NOTE — Plan of Care (Signed)
Plan of care reviewed and discussed with the patient and her daughter. 

## 2022-09-23 NOTE — Progress Notes (Signed)
Physical Therapy Treatment Patient Details Name: Kathryn Ayala MRN: [id number] DOB: December 08, 1934 Today's Date: 09/23/2022   History of Present Illness Pt is an 87yo female presenting s/p L-TKA on 09/22/22. PMH: afib, hx of breast cancer, HLD, HTN, PNA, hx of stroke (deaf R ear), R-TKA 04/07/22, CKD, PVD, gout, fibromyalgia.    PT Comments    Pt is progressing well. She rated pain 9/10 during session. Reviewed/practiced exercises and gait training. Recommended she get next dose of pain meds prior to discharge. Encouraged her to ambulate and perform quad sets every hour as tolerated. All PT education completed.     Recommendations for follow up therapy are one component of a multi-disciplinary discharge planning process, led by the attending physician.  Recommendations may be updated based on patient status, additional functional criteria and insurance authorization.  Follow Up Recommendations  Follow physician's recommendations for discharge plan and follow up therapies     Assistance Recommended at Discharge Frequent or constant Supervision/Assistance  Patient can return home with the following A little help with walking and/or transfers;A little help with bathing/dressing/bathroom;Assistance with cooking/housework;Assist for transportation;Help with stairs or ramp for entrance   Equipment Recommendations  None recommended by PT    Recommendations for Other Services       Precautions / Restrictions Precautions Precautions: Fall;Knee Restrictions Weight Bearing Restrictions: No Other Position/Activity Restrictions: wbat     Mobility  Bed Mobility               General bed mobility comments: oob in recliner    Transfers Overall transfer level: Needs assistance Equipment used: Rolling walker (2 wheels) Transfers: Sit to/from Stand Sit to Stand: Supervision           General transfer comment: Cues for safety, hand/LE placement. Increased time.     Ambulation/Gait Ambulation/Gait assistance: Min guard Gait Distance (Feet): 85 Feet Assistive device: Rolling walker (2 wheels) Gait Pattern/deviations: Step-to pattern, Trunk flexed       General Gait Details: Cues for safety, sequencing. Min guard for safety. Pt denied dizziness. No Lob with RW use.   Stairs             Wheelchair Mobility    Modified Rankin (Stroke Patients Only)       Balance Overall balance assessment: Needs assistance         Standing balance support: Reliant on assistive device for balance, During functional activity, Bilateral upper extremity supported Standing balance-Leahy Scale: Poor                              Cognition Arousal/Alertness: Awake/alert Behavior During Therapy: WFL for tasks assessed/performed Overall Cognitive Status: Within Functional Limits for tasks assessed                                          Exercises Total Joint Exercises Ankle Circles/Pumps: AROM, Both, 10 reps Quad Sets: AROM, Both, 10 reps Hip ABduction/ADduction: AROM, AAROM, 10 reps, Left Straight Leg Raises: AROM, AAROM, 10 reps, Left Knee Flexion: AAROM, Left, 10 reps, Seated Goniometric ROM: ~10-85 degrees    General Comments        Pertinent Vitals/Pain Pain Assessment Pain Assessment: 0-10 Pain Score: 9  Pain Location: left knee with activity Pain Descriptors / Indicators: Operative site guarding, Contraction, Cramping, Spasm Pain Intervention(s): Monitored during session, Repositioned, Ice applied  Home Living                          Prior Function            PT Goals (current goals can now be found in the care plan section) Progress towards PT goals: Progressing toward goals    Frequency    7X/week      PT Plan Current plan remains appropriate    Co-evaluation              AM-PAC PT "6 Clicks" Mobility   Outcome Measure  Help needed turning from your back to  your side while in a flat bed without using bedrails?: None Help needed moving from lying on your back to sitting on the side of a flat bed without using bedrails?: A Little Help needed moving to and from a bed to a chair (including a wheelchair)?: A Little Help needed standing up from a chair using your arms (e.g., wheelchair or bedside chair)?: A Little Help needed to walk in hospital room?: A Little Help needed climbing 3-5 steps with a railing? : A Little 6 Click Score: 19    End of Session Equipment Utilized During Treatment: Gait belt Activity Tolerance: Patient tolerated treatment well Patient left: in chair;with call bell/phone within reach;with family/visitor present   PT Visit Diagnosis: Pain;Difficulty in walking, not elsewhere classified (R26.2) Pain - Right/Left: Left Pain - part of body: Knee     Time: 0962-8366 PT Time Calculation (min) (ACUTE ONLY): 30 min  Charges:  $Gait Training: 8-22 mins $Therapeutic Exercise: 8-22 mins                         Doreatha Massed, PT Acute Rehabilitation  Office: [phone]

## 2022-09-23 NOTE — Progress Notes (Signed)
Subjective: 1 Day Post-Op Procedure(s) (LRB): TOTAL KNEE ARTHROPLASTY (Left) Patient reports pain as mild.   Patient seen in rounds by Dr. Wynelle Link. Patient is well, and has had no acute complaints or problems No issues overnight. Denies chest pain, SOB, or calf pain. Foley catheter removed this AM.  We will continue therapy today, ambulated 15' yesterday.   Objective: Vital signs in last 24 hours: Temp:  [97.5 F (36.4 C)-98.8 F (37.1 C)] 98.1 F (36.7 C) (12/05 0617) Pulse Rate:  [69-98] 72 (12/05 0617) Resp:  [12-20] 18 (12/05 0617) BP: (122-165)/(65-101) 127/74 (12/05 0617) SpO2:  [91 %-100 %] 100 % (12/05 0617) Weight:  [64.4 kg] 64.4 kg (12/04 0953)  Intake/Output from previous day:  Intake/Output Summary (Last 24 hours) at 09/23/2022 0817 Last data filed at 09/23/2022 0600 Gross per 24 hour  Intake 3932.59 ml  Output 2600 ml  Net 1332.59 ml     Intake/Output this shift: No intake/output data recorded.  Labs: Recent Labs    09/23/22 0332  HGB 11.9*   Recent Labs    09/23/22 0332  WBC 9.8  RBC 3.75*  HCT 36.8  PLT 205   Recent Labs    09/23/22 0332  NA 136  K 4.0  CL 103  CO2 24  BUN 19  CREATININE 0.74  GLUCOSE 154*  CALCIUM 9.5   No results for input(s): "LABPT", "INR" in the last 72 hours.  Exam: General - Patient is Alert and Oriented Extremity - Neurologically intact Neurovascular intact Sensation intact distally Dorsiflexion/Plantar flexion intact Dressing - dressing C/D/I Motor Function - intact, moving foot and toes well on exam.   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   Weight as of this encounter: 64.4 kg. Advance diet Up with therapy D/C IV fluids   Patient's anticipated LOS is less than 2 midnights, meeting these requirements: - Lives within 1 hour of care - Has a competent adult at home to recover with post-op recover - NO history of  - Chronic pain requiring opioids  - Diabetes  - Coronary Artery Disease  - Heart failure  - Heart attack  - DVT/VTE  - Respiratory Failure/COPD  - Renal failure  - Anemia  - Advanced Liver disease  DVT Prophylaxis -  Eliquis Weight bearing as tolerated. Continue therapy.  Plan is to go Home after hospital stay. Plan for discharge later today if progresses with therapy and meeting goals. Scheduled for OPP[facility] at [facility]). Follow-up in the office in 2 weeks.  The PDMP database was reviewed today prior to any opioid medications being prescribed to this patient.  Theresa Duty, PA-C Orthopedic Surgery [phone])  [phone] 09/23/2022, 8:17 AM

## 2022-09-23 NOTE — TOC Transition Note (Signed)
Transition of Care [facility]) - CM/SW Discharge Note  Patient Details  Name: Kathryn Ayala MRN: [id number] Date of Birth: 1935/01/25  [facility]) CM/SW Contact:  Sherie Don, LCSW Phone Number: 09/23/2022, 9:41 AM  Clinical Narrative: Patient is expected to discharge home after working with PT. CSW met with patient and daughter regarding discharge plan. Patient will go home with OPPT at [facility] in [place]. Patient has a rolling walker, BSC, and raised toilet seat at home so there are no DME needs at this time. TOC signing off.  Final next level of care: OP Rehab Barriers to Discharge: No Barriers Identified  Patient Goals and CMS Choice Patient states their goals for this hospitalization and ongoing recovery are:: Discharge home with OPPT at [facility] PT Choice offered to / list presented to : NA  Discharge Plan and Services         DME Arranged: N/A DME Agency: NA HH Arranged: NA HH Agency: NA  Social Determinants of Health (SDOH) Interventions    Readmission Risk Interventions     No data to display

## 2022-09-24 NOTE — Discharge Summary (Signed)
Patient ID: Kathryn Ayala MRN: [id number] DOB/AGE: 1935-08-27 87 y.o.  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 female who was admitted 09/22/2022 for operative treatment ofPrimary osteoarthritis of both knees. Patient has severe unremitting pain that affects sleep, daily activities, and work/hobbies. After pre-op clearance the patient was taken to the operating room on 09/22/2022 and underwent  Procedure(s): TOTAL KNEE ARTHROPLASTY.    Patient was given perioperative antibiotics:  Anti-infectives (From admission, onward)    Start     Dose/Rate Route Frequency Ordered Stop   09/22/22 2200  vancomycin (VANCOCIN) IVPB 1000 mg/200 mL premix        1,000 mg 200 mL/hr over 60 Minutes Intravenous Every 12 hours 09/22/22 1659 09/23/22 0009   09/22/22 0945  vancomycin (VANCOCIN) IVPB 1000 mg/200 mL premix        1,000 mg 200 mL/hr over 60 Minutes Intravenous On call to O.R. 09/22/22 0935 09/22/22 1212        Patient was given sequential compression devices, early ambulation, and chemoprophylaxis to prevent DVT.  Patient benefited maximally from hospital stay and there were no complications.    Recent vital signs: Patient Vitals for the  past 24 hrs:  BP Temp Temp src Pulse Resp SpO2  09/23/22 1009 132/81 98.1 F (36.7 C) Oral 80 17 97 %     Recent laboratory studies:  Recent Labs    09/23/22 0332  WBC 9.8  HGB 11.9*  HCT 36.8  PLT 205  NA 136  K 4.0  CL 103  CO2 24  BUN 19  CREATININE 0.74  GLUCOSE 154*  CALCIUM 9.5     Discharge Medications:   Allergies as of 09/23/2022       Reactions   Penicillins Anaphylaxis, Swelling, Rash   Has patient had a PCN reaction causing immediate rash, facial/tongue/throat swelling, SOB or lightheadedness with hypotension: Yes Has patient had a PCN reaction causing severe rash involving mucus membranes or skin necrosis: Yes Has patient had a PCN reaction that required hospitalization No Has patient had a PCN reaction occurring within the last 10 years: No If all of the above answers are "NO", then may proceed with Cephalosporin use.   Azithromycin    PT unsure of reaction    Codeine Other (See Comments)   UNSPECIFIED REACTION Patient was told not to take it.   Levaquin [levofloxacin] Other (See Comments)   UNSPECIFIED REACTION Patient was told not to take it.   Sulfa Antibiotics Itching, Rash        Medication List     TAKE these medications    acetaminophen 500 MG  tablet Commonly known as: TYLENOL Take 1,000 mg by mouth every 6 (six) hours as needed for moderate pain.   allopurinol 100 MG tablet Commonly known as: ZYLOPRIM Take 100 mg by mouth 2 (two) times daily.   atenolol 50 MG tablet Commonly known as: TENORMIN Take 50 mg by mouth 2 (two) times daily.   Biotin 5000 5 MG Caps Generic drug: Biotin Take 5 mg by mouth daily.   Eliquis 5 MG Tabs tablet Generic drug: apixaban TAKE 1 TABLET BY MOUTH TWICE A DAY   furosemide 20 MG tablet Commonly known as: LASIX Take 20 mg by mouth daily as needed for edema.   loratadine 10 MG tablet Commonly known as: CLARITIN Take 10 mg by mouth daily.   methocarbamol 500 MG tablet Commonly known as:  ROBAXIN Take 1 tablet (500 mg total) by mouth every 6 (six) hours as needed for muscle spasms.   rosuvastatin 5 MG tablet Commonly known as: CRESTOR Take 5 mg by mouth daily.   traMADol 50 MG tablet Commonly known as: ULTRAM Take 1-2 tablets (50-100 mg total) by mouth every 6 (six) hours as needed for moderate pain or severe pain.   triamterene-hydrochlorothiazide 37.5-25 MG tablet Commonly known as: MAXZIDE-25 TAKE 1 TABLET BY MOUTH EVERY DAY   venlafaxine XR 37.5 MG 24 hr capsule Commonly known as: EFFEXOR-XR Take 37.5 mg by mouth daily.   vitamin B-12 500 MCG tablet Commonly known as: CYANOCOBALAMIN Take 500 mcg by mouth daily.   vitamin D3 50 MCG (2000 UT) Caps Take 2,000 Units by mouth daily.   vitamin E 180 MG (400 UNITS) capsule Take 400 Units by mouth daily.               Discharge Care Instructions  (From admission, onward)           Start     Ordered   09/23/22 0000  Weight bearing as tolerated        09/23/22 0820   09/23/22 0000  Change dressing       Comments: You may remove the bulky bandage (ACE wrap and gauze) two days after surgery. You will have an adhesive waterproof bandage underneath. Leave this in place until your first follow-up appointment.   09/23/22 0820            Diagnostic Studies: VAS [place] ABI WITH/WO TBI  Result Date: 09/04/2022  LOWER EXTREMITY DOPPLER STUDY Patient Name:  Kathryn Ayala  Date of Exam:   09/04/2022 Medical Rec #: [id number]           Accession #:    [id number] Date of Birth: 1934/11/09            Patient Gender: F Patient Age:   25 years Exam Location:  [facility] Procedure:      VAS [place] ABI WITH/WO TBI Referring Phys: EMMA COLLINS --------------------------------------------------------------------------------  Indications: Claudication. High Risk Factors: Hypertension, hyperlipidemia, no history of smoking. Other Factors: PVD, thrombophilia,.  Comparison Study: None available Performing  Technologist: Elta Guadeloupe RVT, RDMS  Examination Guidelines: A complete evaluation includes at minimum, Doppler waveform signals and systolic blood pressure reading at the level of bilateral brachial, anterior tibial, and posterior tibial arteries, when vessel segments are accessible. Bilateral testing is considered an integral part of a complete examination. Photoelectric Plethysmograph (PPG) waveforms and toe systolic pressure readings are included as required and additional duplex testing as needed. Limited examinations for reoccurring indications may be performed as noted.  ABI Findings: +---------+------------------+-----+-----------+--------+  Right    Rt Pressure (mmHg)IndexWaveform   Comment  +---------+------------------+-----+-----------+--------+ Brachial 141                                        +---------+------------------+-----+-----------+--------+ PTA      161               1.14 multiphasic         +---------+------------------+-----+-----------+--------+ DP       163               1.16 multiphasic         +---------+------------------+-----+-----------+--------+ Great Toe53                0.38 Abnormal            +---------+------------------+-----+-----------+--------+ +---------+------------------+-----+-----------+-------+ Left     Lt Pressure (mmHg)IndexWaveform   Comment +---------+------------------+-----+-----------+-------+ Brachial 141                                       +---------+------------------+-----+-----------+-------+ PTA      165               1.17 multiphasic        +---------+------------------+-----+-----------+-------+ DP       170               1.21 multiphasic        +---------+------------------+-----+-----------+-------+ Great Toe0                 0.00 Absent             +---------+------------------+-----+-----------+-------+ +-------+-----------+-----------+------------+------------+ ABI/TBIToday's  ABIToday's TBIPrevious ABIPrevious TBI +-------+-----------+-----------+------------+------------+ Right  1.16       0.38                                +-------+-----------+-----------+------------+------------+ Left   1.21       0                                   +-------+-----------+-----------+------------+------------+  Arterial wall calcification precludes accurate ankle pressures and ABIs.  Summary: Right: Resting right ankle-brachial index is within normal range. The right toe-brachial index is abnormal. PPG tracings appear dampened. Left: Resting left ankle-brachial index is within normal range. The left toe-brachial index is abnormal. No detectable left great toe PPG signal. *See table(s) above for measurements and observations.  Electronically signed by Deitra Mayo MD on 09/04/2022 at 11:44:44 AM.    Final     Disposition: Discharge disposition: 01-Home or Self Care       Discharge Instructions     Call MD / Call 911   Complete by: As directed    If you experience chest pain or shortness of breath, CALL 911 and be transported to the hospital emergency room.  If you develope a fever above 101 F, pus (white drainage) or increased drainage or redness at the wound, or calf pain, call your surgeon's office.   Change dressing   Complete by: As directed    You may remove the bulky bandage (ACE wrap and gauze) two days after surgery. You will have an adhesive waterproof bandage underneath. Leave this in place until your first follow-up appointment.  Constipation Prevention   Complete by: As directed    Drink plenty of fluids.  Prune juice may be helpful.  You may use a stool softener, such as Colace (over the counter) 100 mg twice a day.  Use MiraLax (over the counter) for constipation as needed.   Diet - low sodium heart healthy   Complete by: As directed    Do not put a pillow under the knee. Place it under the heel.   Complete by: As directed    Driving  restrictions   Complete by: As directed    No driving for two weeks   Post-operative opioid taper instructions:   Complete by: As directed    POST-OPERATIVE OPIOID TAPER INSTRUCTIONS: It is important to wean off of your opioid medication as soon as possible. If you do not need pain medication after your surgery it is ok to stop day one. Opioids include: Codeine, Hydrocodone(Norco, Vicodin), Oxycodone(Percocet, oxycontin) and hydromorphone amongst others.  Long term and even short term use of opiods can cause: Increased pain response Dependence Constipation Depression Respiratory depression And more.  Withdrawal symptoms can include Flu like symptoms Nausea, vomiting And more Techniques to manage these symptoms Hydrate well Eat regular healthy meals Stay active Use relaxation techniques(deep breathing, meditating, yoga) Do Not substitute Alcohol to help with tapering If you have been on opioids for less than two weeks and do not have pain than it is ok to stop all together.  Plan to wean off of opioids This plan should start within one week post op of your joint replacement. Maintain the same interval or time between taking each dose and first decrease the dose.  Cut the total daily intake of opioids by one tablet each day Next start to increase the time between doses. The last dose that should be eliminated is the evening dose.      TED hose   Complete by: As directed    Use stockings (TED hose) for three weeks on both leg(s).  You may remove them at night for sleeping.   Weight bearing as tolerated   Complete by: As directed         Follow-up Information     Gaynelle Arabian, MD. Go on 10/07/2022.   Specialty: Orthopedic Surgery Why: You are scheduled for a follow up appointment on 10-07-22 at 3:30 pm. Contact information: [address] [place] [address] [phone]                  Signed: Theresa Duty 09/24/2022, 8:18  AM

## 2022-09-25 DIAGNOSIS — R2689 Other abnormalities of gait and mobility: Secondary | ICD-10-CM | POA: Diagnosis not present

## 2022-09-25 DIAGNOSIS — M25562 Pain in left knee: Secondary | ICD-10-CM | POA: Diagnosis not present

## 2022-09-30 DIAGNOSIS — M25562 Pain in left knee: Secondary | ICD-10-CM | POA: Diagnosis not present

## 2022-09-30 DIAGNOSIS — R2689 Other abnormalities of gait and mobility: Secondary | ICD-10-CM | POA: Diagnosis not present

## 2022-10-02 DIAGNOSIS — M25562 Pain in left knee: Secondary | ICD-10-CM | POA: Diagnosis not present

## 2022-10-02 DIAGNOSIS — R2689 Other abnormalities of gait and mobility: Secondary | ICD-10-CM | POA: Diagnosis not present

## 2022-10-07 DIAGNOSIS — M25562 Pain in left knee: Secondary | ICD-10-CM | POA: Diagnosis not present

## 2022-10-07 DIAGNOSIS — R2689 Other abnormalities of gait and mobility: Secondary | ICD-10-CM | POA: Diagnosis not present

## 2022-10-09 DIAGNOSIS — M25562 Pain in left knee: Secondary | ICD-10-CM | POA: Diagnosis not present

## 2022-10-09 DIAGNOSIS — R2689 Other abnormalities of gait and mobility: Secondary | ICD-10-CM | POA: Diagnosis not present

## 2022-10-22 DIAGNOSIS — R2689 Other abnormalities of gait and mobility: Secondary | ICD-10-CM | POA: Diagnosis not present

## 2022-10-22 DIAGNOSIS — M25562 Pain in left knee: Secondary | ICD-10-CM | POA: Diagnosis not present

## 2022-10-24 DIAGNOSIS — R2689 Other abnormalities of gait and mobility: Secondary | ICD-10-CM | POA: Diagnosis not present

## 2022-10-24 DIAGNOSIS — M25562 Pain in left knee: Secondary | ICD-10-CM | POA: Diagnosis not present

## 2022-10-29 DIAGNOSIS — Z5189 Encounter for other specified aftercare: Secondary | ICD-10-CM | POA: Diagnosis not present

## 2022-11-03 DIAGNOSIS — R2689 Other abnormalities of gait and mobility: Secondary | ICD-10-CM | POA: Diagnosis not present

## 2022-11-03 DIAGNOSIS — M25562 Pain in left knee: Secondary | ICD-10-CM | POA: Diagnosis not present

## 2022-11-05 ENCOUNTER — Ambulatory Visit: Payer: Medicare PPO | Admitting: Cardiology

## 2022-11-07 ENCOUNTER — Emergency Department (HOSPITAL_COMMUNITY): Payer: Medicare PPO

## 2022-11-07 ENCOUNTER — Other Ambulatory Visit: Payer: Self-pay

## 2022-11-07 ENCOUNTER — Inpatient Hospital Stay (HOSPITAL_COMMUNITY)
Admission: EM | Admit: 2022-11-07 | Discharge: 2022-11-11 | DRG: 522 | Disposition: A | Discharge status: Home / Self Care | Payer: Medicare PPO | Attending: Internal Medicine | Admitting: Internal Medicine

## 2022-11-07 DIAGNOSIS — Z79899 Other long term (current) drug therapy: Secondary | ICD-10-CM

## 2022-11-07 DIAGNOSIS — I480 Paroxysmal atrial fibrillation: Secondary | ICD-10-CM | POA: Diagnosis present

## 2022-11-07 DIAGNOSIS — Z853 Personal history of malignant neoplasm of breast: Secondary | ICD-10-CM

## 2022-11-07 DIAGNOSIS — N1831 Chronic kidney disease, stage 3a: Secondary | ICD-10-CM | POA: Diagnosis not present

## 2022-11-07 DIAGNOSIS — Z823 Family history of stroke: Secondary | ICD-10-CM | POA: Diagnosis not present

## 2022-11-07 DIAGNOSIS — M797 Fibromyalgia: Secondary | ICD-10-CM | POA: Diagnosis present

## 2022-11-07 DIAGNOSIS — D62 Acute posthemorrhagic anemia: Secondary | ICD-10-CM | POA: Diagnosis not present

## 2022-11-07 DIAGNOSIS — E785 Hyperlipidemia, unspecified: Secondary | ICD-10-CM | POA: Diagnosis not present

## 2022-11-07 DIAGNOSIS — G629 Polyneuropathy, unspecified: Secondary | ICD-10-CM | POA: Diagnosis present

## 2022-11-07 DIAGNOSIS — E876 Hypokalemia: Secondary | ICD-10-CM | POA: Diagnosis present

## 2022-11-07 DIAGNOSIS — Z881 Allergy status to other antibiotic agents status: Secondary | ICD-10-CM | POA: Diagnosis not present

## 2022-11-07 DIAGNOSIS — M25562 Pain in left knee: Secondary | ICD-10-CM | POA: Diagnosis not present

## 2022-11-07 DIAGNOSIS — Z880 Allergy status to penicillin: Secondary | ICD-10-CM

## 2022-11-07 DIAGNOSIS — Z801 Family history of malignant neoplasm of trachea, bronchus and lung: Secondary | ICD-10-CM

## 2022-11-07 DIAGNOSIS — W19XXXA Unspecified fall, initial encounter: Principal | ICD-10-CM

## 2022-11-07 DIAGNOSIS — R400 Somnolence: Secondary | ICD-10-CM | POA: Diagnosis not present

## 2022-11-07 DIAGNOSIS — S72002A Fracture of unspecified part of neck of left femur, initial encounter for closed fracture: Secondary | ICD-10-CM | POA: Diagnosis not present

## 2022-11-07 DIAGNOSIS — F419 Anxiety disorder, unspecified: Secondary | ICD-10-CM | POA: Diagnosis present

## 2022-11-07 DIAGNOSIS — I129 Hypertensive chronic kidney disease with stage 1 through stage 4 chronic kidney disease, or unspecified chronic kidney disease: Secondary | ICD-10-CM | POA: Diagnosis not present

## 2022-11-07 DIAGNOSIS — Z7901 Long term (current) use of anticoagulants: Secondary | ICD-10-CM

## 2022-11-07 DIAGNOSIS — Z043 Encounter for examination and observation following other accident: Secondary | ICD-10-CM | POA: Diagnosis not present

## 2022-11-07 DIAGNOSIS — H9191 Unspecified hearing loss, right ear: Secondary | ICD-10-CM | POA: Diagnosis present

## 2022-11-07 DIAGNOSIS — I739 Peripheral vascular disease, unspecified: Secondary | ICD-10-CM | POA: Diagnosis not present

## 2022-11-07 DIAGNOSIS — Z96642 Presence of left artificial hip joint: Secondary | ICD-10-CM | POA: Diagnosis not present

## 2022-11-07 DIAGNOSIS — Z96653 Presence of artificial knee joint, bilateral: Secondary | ICD-10-CM | POA: Diagnosis present

## 2022-11-07 DIAGNOSIS — M1612 Unilateral primary osteoarthritis, left hip: Secondary | ICD-10-CM | POA: Diagnosis not present

## 2022-11-07 DIAGNOSIS — W109XXA Fall (on) (from) unspecified stairs and steps, initial encounter: Secondary | ICD-10-CM | POA: Diagnosis present

## 2022-11-07 DIAGNOSIS — M109 Gout, unspecified: Secondary | ICD-10-CM | POA: Diagnosis present

## 2022-11-07 DIAGNOSIS — I10 Essential (primary) hypertension: Secondary | ICD-10-CM | POA: Diagnosis not present

## 2022-11-07 DIAGNOSIS — F32A Depression, unspecified: Secondary | ICD-10-CM | POA: Diagnosis present

## 2022-11-07 DIAGNOSIS — Z471 Aftercare following joint replacement surgery: Secondary | ICD-10-CM | POA: Diagnosis not present

## 2022-11-07 DIAGNOSIS — I272 Pulmonary hypertension, unspecified: Secondary | ICD-10-CM | POA: Diagnosis not present

## 2022-11-07 DIAGNOSIS — Z882 Allergy status to sulfonamides status: Secondary | ICD-10-CM

## 2022-11-07 DIAGNOSIS — S7292XA Unspecified fracture of left femur, initial encounter for closed fracture: Secondary | ICD-10-CM | POA: Diagnosis not present

## 2022-11-07 DIAGNOSIS — D649 Anemia, unspecified: Secondary | ICD-10-CM | POA: Diagnosis not present

## 2022-11-07 DIAGNOSIS — G4700 Insomnia, unspecified: Secondary | ICD-10-CM | POA: Diagnosis not present

## 2022-11-07 DIAGNOSIS — C50212 Malignant neoplasm of upper-inner quadrant of left female breast: Secondary | ICD-10-CM | POA: Diagnosis present

## 2022-11-07 DIAGNOSIS — S72042A Displaced fracture of base of neck of left femur, initial encounter for closed fracture: Secondary | ICD-10-CM | POA: Diagnosis not present

## 2022-11-07 DIAGNOSIS — J189 Pneumonia, unspecified organism: Secondary | ICD-10-CM | POA: Diagnosis not present

## 2022-11-07 DIAGNOSIS — Z885 Allergy status to narcotic agent status: Secondary | ICD-10-CM

## 2022-11-07 DIAGNOSIS — Z8673 Personal history of transient ischemic attack (TIA), and cerebral infarction without residual deficits: Secondary | ICD-10-CM | POA: Diagnosis not present

## 2022-11-07 DIAGNOSIS — F418 Other specified anxiety disorders: Secondary | ICD-10-CM | POA: Diagnosis not present

## 2022-11-07 DIAGNOSIS — I4891 Unspecified atrial fibrillation: Secondary | ICD-10-CM | POA: Diagnosis not present

## 2022-11-07 DIAGNOSIS — R2689 Other abnormalities of gait and mobility: Secondary | ICD-10-CM | POA: Diagnosis not present

## 2022-11-07 LAB — CBC WITH DIFFERENTIAL/PLATELET
Abs Immature Granulocytes: 0.06 10*3/uL (ref 0.00–0.07)
Basophils Absolute: 0 10*3/uL (ref 0.0–0.1)
Basophils Relative: 1 %
Eosinophils Absolute: 0.1 10*3/uL (ref 0.0–0.5)
Eosinophils Relative: 1 %
HCT: 45.1 % (ref 36.0–46.0)
Hemoglobin: 14.1 g/dL (ref 12.0–15.0)
Immature Granulocytes: 1 %
Lymphocytes Relative: 11 %
Lymphs Abs: 0.6 10*3/uL — ABNORMAL LOW (ref 0.7–4.0)
MCH: 31.3 pg (ref 26.0–34.0)
MCHC: 31.3 g/dL (ref 30.0–36.0)
MCV: 100.2 fL — ABNORMAL HIGH (ref 80.0–100.0)
Monocytes Absolute: 0.5 10*3/uL (ref 0.1–1.0)
Monocytes Relative: 9 %
Neutro Abs: 4.4 10*3/uL (ref 1.7–7.7)
Neutrophils Relative %: 77 %
Platelets: 229 10*3/uL (ref 150–400)
RBC: 4.5 MIL/uL (ref 3.87–5.11)
RDW: 14.5 % (ref 11.5–15.5)
WBC: 5.7 10*3/uL (ref 4.0–10.5)
nRBC: 0 % (ref 0.0–0.2)

## 2022-11-07 LAB — BASIC METABOLIC PANEL
Anion gap: 11 (ref 5–15)
BUN: 31 mg/dL — ABNORMAL HIGH (ref 8–23)
CO2: 23 mmol/L (ref 22–32)
Calcium: 10.3 mg/dL (ref 8.9–10.3)
Chloride: 101 mmol/L (ref 98–111)
Creatinine, Ser: 0.96 mg/dL (ref 0.44–1.00)
GFR, Estimated: 58 mL/min — ABNORMAL LOW (ref >60)
Glucose, Bld: 100 mg/dL — ABNORMAL HIGH (ref 70–99)
Potassium: 3.4 mmol/L — ABNORMAL LOW (ref 3.5–5.1)
Sodium: 135 mmol/L (ref 135–145)

## 2022-11-07 LAB — PROTIME-INR
INR: 1.2 (ref 0.8–1.2)
Prothrombin Time: 15.5 seconds — ABNORMAL HIGH (ref 11.4–15.2)

## 2022-11-07 MED ORDER — METHOCARBAMOL 500 MG PO TABS
500.0000 mg | ORAL_TABLET | Freq: Four times a day (QID) | ORAL | Status: DC | PRN
  Administered 2022-11-07 – 2022-11-08 (×3): 500 mg via ORAL
  Filled 2022-11-07 (×3): qty 1

## 2022-11-07 MED ORDER — MORPHINE SULFATE (PF) 4 MG/ML IV SOLN
4.0000 mg | Freq: Once | INTRAVENOUS | Status: AC
  Administered 2022-11-07: 4 mg via INTRAVENOUS
  Filled 2022-11-07: qty 1

## 2022-11-07 MED ORDER — HYDROCODONE-ACETAMINOPHEN 5-325 MG PO TABS
1.0000 | ORAL_TABLET | Freq: Four times a day (QID) | ORAL | Status: DC | PRN
  Administered 2022-11-07: 1 via ORAL
  Administered 2022-11-08: 2 via ORAL
  Filled 2022-11-07: qty 2
  Filled 2022-11-07: qty 1

## 2022-11-07 MED ORDER — ALLOPURINOL 100 MG PO TABS
100.0000 mg | ORAL_TABLET | Freq: Two times a day (BID) | ORAL | Status: DC
  Administered 2022-11-07 – 2022-11-11 (×7): 100 mg via ORAL
  Filled 2022-11-07 (×8): qty 1

## 2022-11-07 MED ORDER — METHOCARBAMOL 1000 MG/10ML IJ SOLN: 500.0000 mg | Freq: Four times a day (QID) | INTRAVENOUS | Status: DC | PRN

## 2022-11-07 MED ORDER — MORPHINE SULFATE (PF) 2 MG/ML IV SOLN
0.5000 mg | INTRAVENOUS | Status: DC | PRN
  Administered 2022-11-08: 0.5 mg via INTRAVENOUS
  Filled 2022-11-07 (×2): qty 1

## 2022-11-07 MED ORDER — SODIUM CHLORIDE 0.9 % IV SOLN: INTRAVENOUS | Status: DC

## 2022-11-07 MED ORDER — ATENOLOL 50 MG PO TABS
50.0000 mg | ORAL_TABLET | Freq: Two times a day (BID) | ORAL | Status: DC
  Administered 2022-11-07 – 2022-11-11 (×7): 50 mg via ORAL
  Filled 2022-11-07 (×7): qty 1

## 2022-11-07 MED ORDER — POTASSIUM CHLORIDE CRYS ER 20 MEQ PO TBCR
20.0000 meq | EXTENDED_RELEASE_TABLET | Freq: Once | ORAL | Status: AC
  Administered 2022-11-07: 20 meq via ORAL
  Filled 2022-11-07: qty 1

## 2022-11-07 MED ORDER — POLYETHYLENE GLYCOL 3350 17 G PO PACK: 17.0000 g | PACK | Freq: Every day | ORAL | Status: DC | PRN

## 2022-11-07 MED ORDER — ONDANSETRON HCL 4 MG/2ML IJ SOLN
4.0000 mg | Freq: Once | INTRAMUSCULAR | Status: AC
  Administered 2022-11-07: 4 mg via INTRAVENOUS
  Filled 2022-11-07: qty 2

## 2022-11-07 MED ORDER — DIPHENHYDRAMINE HCL 25 MG PO CAPS
25.0000 mg | ORAL_CAPSULE | Freq: Four times a day (QID) | ORAL | Status: DC | PRN
  Administered 2022-11-07: 25 mg via ORAL
  Filled 2022-11-07: qty 1

## 2022-11-07 NOTE — H&P (Addendum)
History and Physical    Patient: Kathryn Ayala TTS:[id number] DOB: 09-25-1935 DOA: 11/07/2022 DOS: the patient was seen and examined on 11/07/2022 PCP: Crist Infante, MD  Patient coming from: Home - lives with her granddaughter. Ambulates independently.    Chief Complaint: fall  HPI: Kathryn Ayala is a 88 y.o. female with medical history significant of atrial fib on eliquis, hx of breast cancer, anxiety, CKD stage 3a, HTN, HLD, gout, pulmonary HTN, fibromyalgia, PVD who presented to Ed after fall. She just had her left knee replaced in December of 2023.  She had just graduated from walking with a walker to a cane. She was going out her back door and her foot slipped on the edging and she came down on her left side.  She could not move. She yelled for help and her granddaughter called EMS. She had immediate pain in her left hip. She last took her eliquis at 900AM.   She does not smoke or drink alcohol.   He has been feeling good. Denies any fever/chills, vision changes/headaches, chest pain or palpitations, shortness of breath or cough, abdominal pain, N/V/D, dysuria or leg swelling.    ER Course:  vitals: afebrile, bp: 166/93, HR: 93, RR: 18, oxygen: 100% on RA Pertinent labs: potassium: 3.4, BUN: 31, INR: 1.2 CXR: enlarged cardiopericardial silhouette. No acute finding.  Hip xray: suspected left femoral neck fracture Left knee xray: Status post total knee arthroplasty with no evidence of acute fracture or hardware loosening In ED: ortho consulted. Given pain medication and TRH asked to admit.    Review of Systems: As mentioned in the history of present illness. All other systems reviewed and are negative. 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   Tachycardia    Urinary frequency    Past Surgical History:  Procedure Laterality Date   G[place] Left 05/07/2016   BREAST BIOPSY Bilateral 1956, 1980   BREAST LUMPECTOMY WITH RADIOACTIVE SEED LOCALIZATION Left 06/16/2016   Procedure: LEFT BREAST LUMPECTOMY WITH RADIOACTIVE SEED LOCALIZATION;  Surgeon: Autumn Messing III, MD;  Location: [place];  Service: General;  Laterality: Left;   COLONOSCOPY     KIDNEY STONE SURGERY     KNEE SURGERY  11/20/2004   TOTAL KNEE ARTHROPLASTY Right 04/07/2022   Procedure: TOTAL KNEE ARTHROPLASTY;  Surgeon: Gaynelle Arabian, MD;  Location: [facility];  Service: Orthopedics;  Laterality: Right;   TOTAL KNEE ARTHROPLASTY Left 09/22/2022   Procedure: TOTAL KNEE ARTHROPLASTY;  Surgeon: Gaynelle Arabian, MD;  Location: [facility];  Service: Orthopedics;  Laterality: Left;   WRIST SURGERY Left 2018   Social History:  reports that she has never smoked. She has never been exposed to tobacco smoke. She has never used smokeless tobacco. She reports that she does not drink alcohol and does not use drugs.  Allergies  Allergen Reactions   Penicillins Anaphylaxis, Swelling and Rash    Has patient had a PCN reaction causing immediate rash, facial/tongue/throat swelling,  SOB or lightheadedness with hypotension: Yes Has patient had a PCN reaction causing severe rash involving mucus membranes or skin necrosis: Yes Has patient had a PCN reaction that required hospitalization No Has patient had a PCN reaction occurring within the last 10 years: No If all of the above answers are "NO", then may proceed with Cephalosporin use.    Azithromycin     PT unsure of reaction    Codeine Other (See Comments)    UNSPECIFIED REACTION Patient was told not to take it.   Levaquin [Levofloxacin] Other (See Comments)    UNSPECIFIED  REACTION Patient was told not to take it.   Sulfa Antibiotics Itching and Rash    Family History  Problem Relation Age of Onset   Lung cancer Brother    Stroke Mother    Pneumonia Father    Cancer Brother     Prior to Admission medications   Medication Sig Start Date End Date Taking? Authorizing Provider  acetaminophen (TYLENOL) 500 MG tablet Take 1,000 mg by mouth every 6 (six) hours as needed for moderate pain.    [provider]  allopurinol (ZYLOPRIM) 100 MG tablet Take 100 mg by mouth 2 (two) times daily.  03/04/16   [provider]  atenolol (TENORMIN) 50 MG tablet Take 50 mg by mouth 2 (two) times daily. 05/05/16   [provider]  Biotin (BIOTIN 5000) 5 MG CAPS Take 5 mg by mouth daily.    [provider]  Cholecalciferol (VITAMIN D3) 50 MCG (2000 UT) CAPS Take 2,000 Units by mouth daily.    [provider]  ELIQUIS 5 MG TABS tablet TAKE 1 TABLET BY MOUTH TWICE A DAY 11/14/19   Adrian Prows, MD  furosemide (LASIX) 20 MG tablet Take 20 mg by mouth daily as needed for edema.    [provider]  loratadine (CLARITIN) 10 MG tablet Take 10 mg by mouth daily.    [provider]  methocarbamol (ROBAXIN) 500 MG tablet Take 1 tablet (500 mg total) by mouth every 6 (six) hours as needed for muscle spasms. 09/23/22   Edmisten, Kristie L, PA  rosuvastatin (CRESTOR) 5 MG tablet Take 5 mg by mouth daily.    [provider]  traMADol (ULTRAM) 50 MG tablet Take 1-2 tablets (50-100 mg total) by mouth every 6 (six) hours as needed for moderate pain or severe pain. 09/23/22   Edmisten, Ok Anis, PA  triamterene-hydrochlorothiazide (MAXZIDE-25) 37.5-25 MG tablet TAKE 1 TABLET BY MOUTH EVERY DAY 06/17/22   Adrian Prows, MD  venlafaxine XR (EFFEXOR-XR) 37.5 MG 24 hr capsule Take 37.5 mg by mouth daily.    [provider]  vitamin B-12 (CYANOCOBALAMIN) 500 MCG tablet Take 500 mcg by mouth daily.    [provider]  vitamin  E 180 MG (400 UNITS) capsule Take 400 Units by mouth daily.    [provider]    Physical Exam: Vitals:   11/07/22 2045 11/07/22 2100 11/07/22 2115 11/07/22 2130  BP: (!) 163/89 (!) 163/96 (!) 158/90 (!) 162/98  Pulse: (!) 103 97 98 99  Resp: '20 19 19 19  '$ Temp:      TempSrc:      SpO2: 91% 98% 96% 94%   General:  Appears calm and comfortable and is in NAD Eyes:  PERRL, EOMI, normal lids, iris ENT:  HOH, lips & tongue, dry mucous membranes, appropriate dentition Neck:  no LAD, masses or thyromegaly; no carotid bruits Cardiovascular:  Regularly irregular, no m/r/g.  Trace LE edema.  Respiratory:   CTA bilaterally with no wheezes/rales/rhonchi.  Normal respiratory effort. Abdomen:  soft, NT, ND, NABS Back:   normal alignment, no CVAT Skin:  no rash or induration seen on limited exam Musculoskeletal:  LLE: TTP over greater trochanteric process and superior lateral aspect of femur. No bruising or edema of knee or ankle. Sensation intact. Can move toes. Pedal pulses 2+ Lower extremity:  Limited foot exam with no ulcerations.  2+ distal pulses. Psychiatric:  grossly normal mood and affect, speech fluent and appropriate, AOx3 Neurologic:  CN 2-12 grossly intact, moves all extremities in coordinated fashion, sensation intact   Radiological Exams on Admission: Independently reviewed - see discussion in A/P where applicable  DG Hip Unilat W or Wo Pelvis 2-3 Views Left  Result Date: 11/07/2022 CLINICAL DATA:  Fall, concern for left hip fracture. EXAM: DG HIP (WITH OR WITHOUT PELVIS) 2-3V LEFT COMPARISON:  None Available. FINDINGS: There is a suspected fracture of the left femoral neck with overlapping. No dislocation. The remaining bony structures are intact. Degenerative changes are present in the lumbar spine. IMPRESSION: Suspected left femoral neck fracture. Electronically Signed   By: Brett Fairy M.D.   On: 11/07/2022 20:42   DG Knee 2 Views Left  Result Date:  11/07/2022 CLINICAL DATA:  Fall, left hip fracture. EXAM: LEFT KNEE - 1-2 VIEW COMPARISON:  None Available. FINDINGS: Total knee arthroplasty changes are noted. There is no evidence of fracture, dislocation, or hardware loosening. No joint effusion. The soft tissues are within normal limits. IMPRESSION: Status post total knee arthroplasty with no evidence of acute fracture or hardware loosening. Electronically Signed   By: Brett Fairy M.D.   On: 11/07/2022 20:39   DG Chest Portable 1 View  Result Date: 11/07/2022 CLINICAL DATA:  Left hip fracture.  Fall EXAM: PORTABLE CHEST 1 VIEW COMPARISON:  Rib series 02/06/2005 FINDINGS: Enlarged cardiopericardial silhouette. Tortuous and ectatic aorta. No consolidation, pneumothorax or effusion. Surgical clips overlie the left hemithorax. IMPRESSION: Enlarged cardiopericardial silhouette. No pneumothorax or consolidation. Electronically Signed   By: Jill Side M.D.   On: 11/07/2022 20:36    EKG: Independently reviewed.  Atrial fibrillation with rate 98; nonspecific ST changes with no evidence of acute ischemia   Labs on Admission: I have personally reviewed the available labs and imaging studies at the time of the admission.  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 Last took on 1/19 at 9AM -hip fracture order set utilized -gentle, time limited IVF x 8 hours -NWB   Hypokalemia Check  magnesium Replete x 81mq Trend   Paroxysmal atrial fibrillation (HCC) Rate controlled Continue atenolol Holding eliquis for surgery. Last took this AM Telemetry   Chronic kidney disease, stage 3a ([place] Stable, continue to monitor   Primary hypertension Above goal, continue home atenolol '50mg'$  BID and pain control   Hyperlipidemia Continue crestor   Gout Continue allopurinol  Anxiety disorder On effexor, unsure of dose F/u with medrec tomorrow    Will need med rec fully reconciled tomorrow   Advance Care Planning:   Code Status: DNR  Consults: ortho: Dr. Alvan Dame   DVT Prophylaxis: SCDs   Family Communication: niece at bedside   Severity of Illness: The appropriate patient status for this patient is INPATIENT. Inpatient status is judged to be reasonable and necessary in order to provide the required intensity of service to ensure the patient's safety. The patient's presenting symptoms, physical exam findings, and initial radiographic and laboratory data in the context of their chronic comorbidities is felt to place them at high risk for further clinical deterioration. Furthermore, it is not anticipated that the patient will be medically stable for discharge from the hospital within 2 midnights of admission.   * I certify that at the point of admission it is my clinical judgment that the patient will require inpatient hospital care spanning beyond 2 midnights from the point of admission due to high intensity of service, high risk for further deterioration and high frequency of surveillance required.*  Author: Orma Flaming, MD 11/07/2022 11:12 PM  For on call review [email or web address].

## 2022-11-07 NOTE — Assessment & Plan Note (Signed)
Continue allopurinol 

## 2022-11-07 NOTE — Assessment & Plan Note (Signed)
Check magnesium Replete x 39mq Trend

## 2022-11-07 NOTE — Assessment & Plan Note (Addendum)
88 year old who presented to ED after mechanical fall with suspected left femoral neck fracture  -admit to telemetry -ortho consulted -NPO at midnight -hold eliquis. Last took on 1/19 at 9AM -hip fracture order set utilized -gentle, time limited IVF x 8 hours -NWB

## 2022-11-07 NOTE — Assessment & Plan Note (Signed)
Stable, continue to monitor

## 2022-11-07 NOTE — Assessment & Plan Note (Signed)
Continue crestor 

## 2022-11-07 NOTE — Assessment & Plan Note (Signed)
Rate controlled Continue atenolol Holding eliquis for surgery. Last took this AM Telemetry

## 2022-11-07 NOTE — ED Triage Notes (Signed)
Pt BIBA from home. Pt had mech fall, tripping on step. Fell on L hip. No head trauma. Aox4  Pt on Elequis. Shortening and rotation of L leg noted.

## 2022-11-07 NOTE — Assessment & Plan Note (Signed)
Above goal, continue home atenolol '50mg'$  BID and pain control

## 2022-11-07 NOTE — ED Provider Notes (Signed)
[place] EMERGENCY DEPARTMENT AT [facility] Provider Note   CSN: [id number] Arrival date & time: 11/07/22  1844     History  Chief Complaint  Patient presents with   Fall    hip   Hip Pain    Kathryn Ayala is a 88 y.o. female.  With PMH of A-fib on Eliquis, HTN, HLD who presents after mechanical fall tripping on step falling onto her left hip complaining of left hip pain.  Patient did not hit her head or lose consciousness.  She was unable to stand or walk after the fall.  She said she was walking down her steps to her carport when she missed a step and fell directly onto her left hip.  She is mainly complaining of pain directly in the left hip some rating down to her left knee.  She recently had her left knee replaced by Dr. Vella Raring of [facility] this past December.  She has been ambulating and working with PT since the surgery.  She denies any preceding symptoms leading to fall, no chest pain, no shortness of breath, no lightheadedness or dizziness.  She is on Eliquis and her last dose was this morning around 0700 AM.  Denies any weakness numbness or tingling or loss of sensation of the leg.   Fall  Hip Pain       Home Medications Prior to Admission medications   Medication Sig Start Date End Date Taking? Authorizing Provider  acetaminophen (TYLENOL) 500 MG tablet Take 1,000 mg by mouth every 6 (six) hours as needed for moderate pain.    [provider]  allopurinol (ZYLOPRIM) 100 MG tablet Take 100 mg by mouth 2 (two) times daily.  03/04/16   [provider]  atenolol (TENORMIN) 50 MG tablet Take 50 mg by mouth 2 (two) times daily. 05/05/16   [provider]  Biotin (BIOTIN 5000) 5 MG CAPS Take 5 mg by mouth daily.    [provider]  Cholecalciferol (VITAMIN D3) 50 MCG (2000 UT) CAPS Take 2,000 Units by mouth daily.    [provider]  ELIQUIS 5 MG TABS tablet TAKE 1 TABLET BY MOUTH TWICE A DAY 11/14/19   Adrian Prows, MD  furosemide (LASIX) 20 MG tablet Take 20 mg by mouth daily as needed for edema.    [provider]  loratadine (CLARITIN) 10 MG tablet Take 10 mg by mouth daily.    [provider]  methocarbamol (ROBAXIN) 500 MG tablet Take 1 tablet (500 mg total) by mouth every 6 (six) hours as needed for muscle spasms. 09/23/22   Edmisten, Kristie L, PA  rosuvastatin (CRESTOR) 5 MG tablet Take 5 mg by mouth daily.    [provider]  traMADol (ULTRAM) 50 MG tablet Take 1-2 tablets (50-100 mg total) by mouth every 6 (six) hours as needed for moderate pain or severe pain. 09/23/22   Edmisten, Ok Anis, PA  triamterene-hydrochlorothiazide (MAXZIDE-25) 37.5-25 MG tablet TAKE 1 TABLET BY MOUTH EVERY DAY 06/17/22   Adrian Prows, MD  venlafaxine XR (EFFEXOR-XR) 37.5 MG 24 hr capsule Take 37.5 mg by mouth daily.    [provider]  vitamin B-12 (CYANOCOBALAMIN) 500 MCG tablet Take 500 mcg by mouth daily.    [provider]  vitamin E 180 MG (400 UNITS) capsule Take 400 Units by mouth daily.    [provider]      Allergies    Penicillins, Azithromycin, Codeine, Levaquin [levofloxacin], and Sulfa antibiotics  Review of Systems   Review of Systems  Physical Exam Updated Vital Signs BP (!) 162/98   Pulse 99   Temp 97.8 F (36.6 C) (Oral)   Resp 19   SpO2 94%  Physical Exam Constitutional: Alert and oriented.  Uncomfortable but nontoxic Eyes: Conjunctivae are normal. ENT      Head: Normocephalic and atraumatic.      Nose: No congestion.      Mouth/Throat: Mucous membranes are moist.      Neck/spine: No stridor.  No midline tenderness of the C/T/L-spine, no step-offs or deformities Cardiovascular: S1, S2, irregularly irregular rhythm, regular rate, equal palpable DP pulses, warm well-perfused Respiratory: Normal respiratory effort. Breath sounds are normal.  No chest wall tenderness or deformity O2 sat 99 on RA Gastrointestinal: Soft and  nontender.  Musculoskeletal: Normal range of motion in all extremities.      Right lower leg: No tenderness or edema.      Left lower leg: Left anterior hip tenderness with shortening and external rotation of the left hip, significant pain with logroll of left lower extremity, sensation intact distally, healing left anterior knee surgical scar, no erythema warmth or deformity of the knee or distal to hip Neurologic: Normal speech and language.  No facial droop.  Equal strength bilateral upper extremities 5 out of 5, equal dorsiflexion plantarflexion strength of lower extremities.  Sensation grossly intact of upper and lower extremities.  No gross focal neurologic deficits are appreciated. Skin: Skin is warm, dry and intact. No rash noted. Psychiatric: Mood and affect are normal. Speech and behavior are normal.  ED Results / Procedures / Treatments   Labs (all labs ordered are listed, but only abnormal results are displayed) Labs Reviewed  CBC WITH DIFFERENTIAL/PLATELET - Abnormal; Notable for the following components:      Result Value   MCV 100.2 (*)    Lymphs Abs 0.6 (*)    All other components within normal limits  BASIC METABOLIC PANEL - Abnormal; Notable for the following components:   Potassium 3.4 (*)    Glucose, Bld 100 (*)    BUN 31 (*)    GFR, Estimated 58 (*)    All other components within normal limits  PROTIME-INR - Abnormal; Notable for the following components:   Prothrombin Time 15.5 (*)    All other components within normal limits    EKG EKG Interpretation  Date/Time:  Friday November 07 2022 20:21:10 EST Ventricular Rate:  98 PR Interval:    QRS Duration: 81 QT Interval:  327 QTC Calculation: 418 R Axis:   87 Text Interpretation: Atrial fibrillation Borderline right axis deviation Low voltage, extremity leads Minimal ST depression, lateral leads No significant change since last tracing Since last tracing rate faster Confirmed by Georgina Snell [phone]) on  11/07/2022 9:44:44 PM  Radiology DG Hip Unilat W or Wo Pelvis 2-3 Views Left  Result Date: 11/07/2022 CLINICAL DATA:  Fall, concern for left hip fracture. EXAM: DG HIP (WITH OR WITHOUT PELVIS) 2-3V LEFT COMPARISON:  None Available. FINDINGS: There is a suspected fracture of the left femoral neck with overlapping. No dislocation. The remaining bony structures are intact. Degenerative changes are present in the lumbar spine. IMPRESSION: Suspected left femoral neck fracture. Electronically Signed   By: Brett Fairy M.D.   On: 11/07/2022 20:42   DG Knee 2 Views Left  Result Date: 11/07/2022 CLINICAL DATA:  Fall, left hip fracture. EXAM: LEFT KNEE - 1-2 VIEW COMPARISON:  None Available. FINDINGS: Total knee arthroplasty  changes are noted. There is no evidence of fracture, dislocation, or hardware loosening. No joint effusion. The soft tissues are within normal limits. IMPRESSION: Status post total knee arthroplasty with no evidence of acute fracture or hardware loosening. Electronically Signed   By: Brett Fairy M.D.   On: 11/07/2022 20:39   DG Chest Portable 1 View  Result Date: 11/07/2022 CLINICAL DATA:  Left hip fracture.  Fall EXAM: PORTABLE CHEST 1 VIEW COMPARISON:  Rib series 02/06/2005 FINDINGS: Enlarged cardiopericardial silhouette. Tortuous and ectatic aorta. No consolidation, pneumothorax or effusion. Surgical clips overlie the left hemithorax. IMPRESSION: Enlarged cardiopericardial silhouette. No pneumothorax or consolidation. Electronically Signed   By: Jill Side M.D.   On: 11/07/2022 20:36    Procedures Procedures  Remain on constant cardiac monitor personally reviewed atrial fibrillation with normal rates.  Medications Ordered in ED Medications  morphine (PF) 4 MG/ML injection 4 mg (has no administration in time range)  morphine (PF) 4 MG/ML injection 4 mg (4 mg Intravenous Given 11/07/22 2006)  ondansetron (ZOFRAN) injection 4 mg (4 mg Intravenous Given 11/07/22 2030)    ED  Course/ Medical Decision Making/ A&P Clinical Course as of 11/07/22 2150  Fri Nov 07, 2022  2102 Spoke with Dr Alvan [facility] of [facility], request admission to hospitalist, holding Eliquis and plans for either surgical intervention tomorrow or the next day. [VB]    Clinical Course User Index [VB] Elgie Congo, MD   {                           Medical Decision Making Staisha Winiarski Hammett is a 88 y.o. female.  With PMH of A-fib on Eliquis, HTN, HLD who presents after mechanical fall tripping on step falling onto her left hip complaining of left hip pain.  Patient did not hit her head or lose consciousness.  Patient had mechanical fall without syncope and no head trauma on Eliquis with left hip pain.  Her exam was concerning for left hip fracture, obtained imaging of the left hip which I personally reviewed concerning for left femoral neck fracture.  She is neurovascularly intact, no concern for ischemic limb.  Compartments are soft.  Basic workup obtained due to left hip fracture, chest x-ray obtained no consolidation concern for pneumonia, no pneumothorax.  Labs reviewed generally unremarkable mild hypokalemia 3.4, creatinine 0.96 within normal limits, no leukocytosis white blood cell count 5.7 normal hemoglobin 14.1.  Spoke with Dr. Alvan Dame of orthopedic surgery from [facility] recommending help holding Eliquis and plans for surgical intervention either tomorrow or the next day.  Spoke with Dr. Eliberto Ivory will admit patient under hospitalist service with plans for surgical intervention of left hip fracture.  Amount and/or Complexity of Data Reviewed Labs: ordered. Radiology: ordered.  Risk Prescription drug management. Decision regarding hospitalization.    Final Clinical Impression(s) / ED Diagnoses Final diagnoses:  Fall, initial encounter  Closed displaced fracture of left femoral neck [facility])    Rx / DC Orders ED Discharge Orders     None         Elgie Congo, MD 11/07/22  2152

## 2022-11-07 NOTE — Assessment & Plan Note (Signed)
On effexor, unsure of dose F/u with [facility] tomorrow

## 2022-11-08 ENCOUNTER — Encounter (HOSPITAL_COMMUNITY): Payer: Self-pay | Admitting: Family Medicine

## 2022-11-08 DIAGNOSIS — S72002A Fracture of unspecified part of neck of left femur, initial encounter for closed fracture: Secondary | ICD-10-CM | POA: Diagnosis not present

## 2022-11-08 LAB — CBC
HCT: 34.6 % — ABNORMAL LOW (ref 36.0–46.0)
Hemoglobin: 11 g/dL — ABNORMAL LOW (ref 12.0–15.0)
MCH: 31.5 pg (ref 26.0–34.0)
MCHC: 31.8 g/dL (ref 30.0–36.0)
MCV: 99.1 fL (ref 80.0–100.0)
Platelets: 186 10*3/uL (ref 150–400)
RBC: 3.49 MIL/uL — ABNORMAL LOW (ref 3.87–5.11)
RDW: 14.5 % (ref 11.5–15.5)
WBC: 6.6 10*3/uL (ref 4.0–10.5)
nRBC: 0 % (ref 0.0–0.2)

## 2022-11-08 LAB — TYPE AND SCREEN
ABO/RH(D): A POS
ABO/RH(D): A POS
Antibody Screen: POSITIVE

## 2022-11-08 LAB — BASIC METABOLIC PANEL
Anion gap: 7 (ref 5–15)
BUN: 21 mg/dL (ref 8–23)
CO2: 25 mmol/L (ref 22–32)
Calcium: 9.5 mg/dL (ref 8.9–10.3)
Chloride: 103 mmol/L (ref 98–111)
Creatinine, Ser: 0.72 mg/dL (ref 0.44–1.00)
GFR, Estimated: >60 mL/min (ref >60)
Glucose, Bld: 124 mg/dL — ABNORMAL HIGH (ref 70–99)
Potassium: 3.5 mmol/L (ref 3.5–5.1)
Sodium: 135 mmol/L (ref 135–145)

## 2022-11-08 LAB — URINALYSIS, ROUTINE W REFLEX MICROSCOPIC
Bilirubin Urine: NEGATIVE
Glucose, UA: NEGATIVE mg/dL
Hgb urine dipstick: NEGATIVE
Ketones, ur: NEGATIVE mg/dL
Leukocytes,Ua: NEGATIVE
Nitrite: NEGATIVE
Protein, ur: NEGATIVE mg/dL
Specific Gravity, Urine: 1.012 (ref 1.005–1.030)
pH: 5 (ref 5.0–8.0)

## 2022-11-08 LAB — VITAMIN B12: Vitamin B-12: 543 pg/mL (ref 180–914)

## 2022-11-08 LAB — IRON AND TIBC
Iron: 42 ug/dL (ref 28–170)
Saturation Ratios: 13 % (ref 10.4–31.8)
TIBC: 323 ug/dL (ref 250–450)
UIBC: 281 ug/dL

## 2022-11-08 LAB — SURGICAL PCR SCREEN
MRSA, PCR: NEGATIVE
Staphylococcus aureus: NEGATIVE

## 2022-11-08 LAB — MAGNESIUM: Magnesium: 2 mg/dL (ref 1.7–2.4)

## 2022-11-08 LAB — FOLATE: Folate: 12.5 ng/mL (ref >5.9)

## 2022-11-08 MED ORDER — VENLAFAXINE HCL ER 37.5 MG PO CP24
37.5000 mg | ORAL_CAPSULE | Freq: Every day | ORAL | Status: DC
  Administered 2022-11-08 – 2022-11-11 (×3): 37.5 mg via ORAL
  Filled 2022-11-08 (×3): qty 1

## 2022-11-08 MED ORDER — POVIDONE-IODINE 10 % EX SWAB: 2.0000 | Freq: Once | CUTANEOUS | Status: DC

## 2022-11-08 MED ORDER — LORATADINE 10 MG PO TABS
10.0000 mg | ORAL_TABLET | Freq: Every day | ORAL | Status: DC
  Administered 2022-11-08 – 2022-11-11 (×3): 10 mg via ORAL
  Filled 2022-11-08 (×3): qty 1

## 2022-11-08 MED ORDER — HYDROCODONE-ACETAMINOPHEN 5-325 MG PO TABS
1.0000 | ORAL_TABLET | Freq: Four times a day (QID) | ORAL | Status: DC | PRN
  Administered 2022-11-08 (×2): 1 via ORAL
  Filled 2022-11-08 (×2): qty 1

## 2022-11-08 MED ORDER — ENSURE ENLIVE PO LIQD
237.0000 mL | ORAL | Status: DC
  Administered 2022-11-08 – 2022-11-10 (×2): 237 mL via ORAL

## 2022-11-08 MED ORDER — ROSUVASTATIN CALCIUM 5 MG PO TABS
5.0000 mg | ORAL_TABLET | Freq: Every day | ORAL | Status: DC
  Administered 2022-11-08 – 2022-11-11 (×3): 5 mg via ORAL
  Filled 2022-11-08 (×3): qty 1

## 2022-11-08 MED ORDER — TRANEXAMIC ACID-NACL 1000-0.7 MG/100ML-% IV SOLN
1000.0000 mg | INTRAVENOUS | Status: AC
  Administered 2022-11-09: 1000 mg via INTRAVENOUS

## 2022-11-08 MED ORDER — MORPHINE SULFATE (PF) 2 MG/ML IV SOLN
2.0000 mg | INTRAVENOUS | Status: DC | PRN
  Administered 2022-11-08 – 2022-11-09 (×2): 2 mg via INTRAVENOUS
  Filled 2022-11-08 (×2): qty 1

## 2022-11-08 MED ORDER — CHLORHEXIDINE GLUCONATE 4 % EX LIQD
60.0000 mL | Freq: Once | CUTANEOUS | Status: AC
  Administered 2022-11-09: 4 via TOPICAL

## 2022-11-08 MED ORDER — SODIUM CHLORIDE 0.9 % IV SOLN: INTRAVENOUS | Status: DC

## 2022-11-08 MED ORDER — ADULT MULTIVITAMIN W/MINERALS CH
1.0000 | ORAL_TABLET | Freq: Every day | ORAL | Status: DC
  Administered 2022-11-10 – 2022-11-11 (×2): 1 via ORAL
  Filled 2022-11-08 (×2): qty 1

## 2022-11-08 MED ORDER — VANCOMYCIN HCL 1500 MG/300ML IV SOLN
1500.0000 mg | INTRAVENOUS | Status: DC
  Filled 2022-11-08: qty 300

## 2022-11-08 NOTE — Hospital Course (Addendum)
PMH of HTN, HLD, CKD 3A, breast cancer, PAF on Eliquis, pulmonary HTN, fibromyalgia, PVD.  Presented to hospital after mechanical fall.  No head injury or neck injury.  Found to have left femoral neck fracture.  Orthopedic consulted.  Underwent left total hip replacement through anterior approach on 1/21.  Now monitor postop recovery.

## 2022-11-08 NOTE — Consult Note (Signed)
Reason for Consult:left hip femoral neck fracture Referring Physician: Posey Pronto, MD  Lani Havlik is an 88 y.o. female.  HPI: Kathryn Ayala is a 88 y.o. female with medical history significant of atrial fib on eliquis, hx of breast cancer, anxiety, CKD stage 3a, HTN, HLD, gout, pulmonary HTN, fibromyalgia, PVD who presented to Ed after fall. She just had her left knee replaced in December of 2023.  She had just graduated from walking with a walker to a cane. She was going out her back door and her foot slipped on the edging and she came down on her left side.  She could not move. She yelled for help and her granddaughter called EMS. She had immediate pain in her left hip. She last took her eliquis at 900AM.   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   Tachycardia    Urinary frequency     Past Surgical History:  Procedure Laterality Date   [place] Left 05/07/2016   BREAST BIOPSY Bilateral 1956, 1980   BREAST LUMPECTOMY WITH RADIOACTIVE SEED LOCALIZATION Left 06/16/2016   Procedure: LEFT BREAST LUMPECTOMY WITH RADIOACTIVE SEED LOCALIZATION;  Surgeon: Autumn Messing III, MD;  Location: [place];  Service: General;  Laterality: Left;   COLONOSCOPY     KIDNEY STONE SURGERY     KNEE SURGERY  11/20/2004   TOTAL KNEE ARTHROPLASTY Right 04/07/2022   Procedure: TOTAL KNEE ARTHROPLASTY;  Surgeon: Gaynelle Arabian, MD;  Location: [facility];  Service: Orthopedics;  Laterality: Right;   TOTAL KNEE ARTHROPLASTY Left 09/22/2022   Procedure: TOTAL KNEE ARTHROPLASTY;  Surgeon: Gaynelle Arabian, MD;  Location: [facility];   Service: Orthopedics;  Laterality: Left;   WRIST SURGERY Left 2018    Family History  Problem Relation Age of Onset   Lung cancer Brother    Stroke Mother    Pneumonia Father    Cancer Brother     Social History:  reports that she has never smoked. She has never been exposed to tobacco smoke. She has never used smokeless tobacco. She reports that she does not drink alcohol and does not use drugs.  Allergies:  Allergies  Allergen Reactions   Penicillins Anaphylaxis, Swelling and Rash    Has patient had a PCN reaction causing immediate rash, facial/tongue/throat swelling, SOB or lightheadedness with hypotension: Yes Has patient had a PCN reaction causing severe rash involving mucus membranes or skin necrosis: Yes Has patient had a PCN reaction that required hospitalization No Has patient had a PCN reaction occurring within the last 10 years: No If all of the above answers are "NO", then may proceed with Cephalosporin use.    Azithromycin     PT unsure of reaction    Codeine Other (See Comments)    UNSPECIFIED REACTION Patient was told not to take it.   Levaquin [Levofloxacin] Other (See Comments)    UNSPECIFIED REACTION Patient was told not to take it.   Sulfa Antibiotics Itching and Rash  Medications: I have reviewed the patient's current medications. Scheduled:  allopurinol  100 mg Oral BID   atenolol  50 mg Oral BID   feeding supplement  237 mL Oral Q24H   loratadine  10 mg Oral Daily   multivitamin with minerals  1 tablet Oral Daily   rosuvastatin  5 mg Oral Daily   venlafaxine XR  37.5 mg Oral Daily    Results for orders placed or performed during the hospital encounter of 11/07/22 (from the past 24 hour(s))  CBC with Differential     Status: Abnormal   Collection Time: 11/07/22  8:00 PM  Result Value Ref Range   WBC 5.7 4.0 - 10.5 K/uL   RBC 4.50 3.87 - 5.11 MIL/uL   Hemoglobin 14.1 12.0 - 15.0 g/dL   HCT 45.1 36.0 - 46.0 %   MCV 100.2 (H) 80.0 - 100.0 fL    MCH 31.3 26.0 - 34.0 pg   MCHC 31.3 30.0 - 36.0 g/dL   RDW 14.5 11.5 - 15.5 %   Platelets 229 150 - 400 K/uL   nRBC 0.0 0.0 - 0.2 %   Neutrophils Relative % 77 %   Neutro Abs 4.4 1.7 - 7.7 K/uL   Lymphocytes Relative 11 %   Lymphs Abs 0.6 (L) 0.7 - 4.0 K/uL   Monocytes Relative 9 %   Monocytes Absolute 0.5 0.1 - 1.0 K/uL   Eosinophils Relative 1 %   Eosinophils Absolute 0.1 0.0 - 0.5 K/uL   Basophils Relative 1 %   Basophils Absolute 0.0 0.0 - 0.1 K/uL   Immature Granulocytes 1 %   Abs Immature Granulocytes 0.06 0.00 - 0.07 K/uL  Basic metabolic panel     Status: Abnormal   Collection Time: 11/07/22  8:00 PM  Result Value Ref Range   Sodium 135 135 - 145 mmol/L   Potassium 3.4 (L) 3.5 - 5.1 mmol/L   Chloride 101 98 - 111 mmol/L   CO2 23 22 - 32 mmol/L   Glucose, Bld 100 (H) 70 - 99 mg/dL   BUN 31 (H) 8 - 23 mg/dL   Creatinine, Ser 0.96 0.44 - 1.00 mg/dL   Calcium 10.3 8.9 - 10.3 mg/dL   GFR, Estimated 58 (L) >60 mL/min   Anion gap 11 5 - 15  Protime-INR     Status: Abnormal   Collection Time: 11/07/22  8:00 PM  Result Value Ref Range   Prothrombin Time 15.5 (H) 11.4 - 15.2 seconds   INR 1.2 0.8 - 1.2  Magnesium     Status: None   Collection Time: 11/07/22  8:00 PM  Result Value Ref Range   Magnesium 2.0 1.7 - 2.4 mg/dL  Type and screen [place]     Status: None   Collection Time: 11/07/22 11:58 PM  Result Value Ref Range   ABO/RH(D) A POS    Antibody Screen NOT NEEDED    Sample Expiration 11/07/2022,2359   Urinalysis, Routine w reflex microscopic     Status: Abnormal   Collection Time: 11/08/22 12:26 AM  Result Value Ref Range   Color, Urine STRAW (A) YELLOW   APPearance CLEAR CLEAR   Specific Gravity, Urine 1.012 1.005 - 1.030   pH 5.0 5.0 - 8.0   Glucose, UA NEGATIVE NEGATIVE mg/dL   Hgb urine dipstick NEGATIVE NEGATIVE   Bilirubin Urine NEGATIVE NEGATIVE   Ketones, ur NEGATIVE NEGATIVE mg/dL   Protein, ur NEGATIVE NEGATIVE mg/dL    Nitrite NEGATIVE NEGATIVE  Leukocytes,Ua NEGATIVE NEGATIVE  CBC     Status: Abnormal   Collection Time: 11/08/22  4:14 AM  Result Value Ref Range   WBC 6.6 4.0 - 10.5 K/uL   RBC 3.49 (L) 3.87 - 5.11 MIL/uL   Hemoglobin 11.0 (L) 12.0 - 15.0 g/dL   HCT 34.6 (L) 36.0 - 46.0 %   MCV 99.1 80.0 - 100.0 fL   MCH 31.5 26.0 - 34.0 pg   MCHC 31.8 30.0 - 36.0 g/dL   RDW 14.5 11.5 - 15.5 %   Platelets 186 150 - 400 K/uL   nRBC 0.0 0.0 - 0.2 %  Basic metabolic panel     Status: Abnormal   Collection Time: 11/08/22  4:14 AM  Result Value Ref Range   Sodium 135 135 - 145 mmol/L   Potassium 3.5 3.5 - 5.1 mmol/L   Chloride 103 98 - 111 mmol/L   CO2 25 22 - 32 mmol/L   Glucose, Bld 124 (H) 70 - 99 mg/dL   BUN 21 8 - 23 mg/dL   Creatinine, Ser 0.72 0.44 - 1.00 mg/dL   Calcium 9.5 8.9 - 10.3 mg/dL   GFR, Estimated >60 >60 mL/min   Anion gap 7 5 - 15  Iron and TIBC     Status: None   Collection Time: 11/08/22 10:18 AM  Result Value Ref Range   Iron 42 28 - 170 ug/dL   TIBC 323 250 - 450 ug/dL   Saturation Ratios 13 10.4 - 31.8 %   UIBC 281 ug/dL  Vitamin B12     Status: None   Collection Time: 11/08/22 10:18 AM  Result Value Ref Range   Vitamin B-12 543 180 - 914 pg/mL     X-ray: CLINICAL DATA:  Fall, concern for left hip fracture.   EXAM: DG HIP (WITH OR WITHOUT PELVIS) 2-3V LEFT   COMPARISON:  None Available.   FINDINGS: There is a suspected fracture of the left femoral neck with overlapping. No dislocation. The remaining bony structures are intact. Degenerative changes are present in the lumbar spine.   IMPRESSION: Suspected left femoral neck fracture.     Electronically Signed   By: Brett Fairy M.D.  ROS: As per HPI  Blood pressure 124/66, pulse 100, temperature 97.6 F (36.4 C), resp. rate 16, height '5\' 3"'$  (1.6 m), SpO2 92 %.  Physical Exam: General:  Appears calm and comfortable and is in NAD Eyes:  PERRL, EOMI, normal lids, iris ENT:  HOH, lips & tongue,  dry mucous membranes, appropriate dentition Neck:  no LAD, masses or thyromegaly; no carotid bruits Cardiovascular:  Regularly irregular, no m/r/g. Trace LE edema.  Respiratory:   CTA bilaterally with no wheezes/rales/rhonchi.  Normal respiratory effort. Abdomen:  soft, NT, ND, NABS Back:   normal alignment, no CVAT Skin:  no rash or induration seen on limited exam Musculoskeletal:  LLE: TTP over greater trochanteric process and superior lateral aspect of femur. No bruising or edema of knee or ankle. Sensation intact. Can move toes. Pedal pulses 2+ Lower extremity:  Limited foot exam with no ulcerations.  2+ distal pulses. Psychiatric:  grossly normal mood and affect, speech fluent and appropriate, AOx3 Neurologic:  CN 2-12 grossly intact, moves all extremities in coordinated fashion, sensation intact  Assessment/Plan: Left hip displaced femoral neck fracture  Plan: Will plan to take her to the 1/21 for a left total hip replacement to address her left hip fracture NPO after MN Consent ordered On Eliquis - last dose  Friday 1/19 am  Mauri Pole 11/08/2022, 3:09 PM

## 2022-11-08 NOTE — H&P (View-Only) (Signed)
Reason for Consult:left hip femoral neck fracture Referring Physician: Posey Pronto, MD  Kathryn Ayala is an 88 y.o. female.  HPI: Kathryn Ayala is a 88 y.o. female with medical history significant of atrial fib on eliquis, hx of breast cancer, anxiety, CKD stage 3a, HTN, HLD, gout, pulmonary HTN, fibromyalgia, PVD who presented to Ed after fall. She just had her left knee replaced in December of 2023.  She had just graduated from walking with a walker to a cane. She was going out her back door and her foot slipped on the edging and she came down on her left side.  She could not move. She yelled for help and her granddaughter called EMS. She had immediate pain in her left hip. She last took her eliquis at 900AM.   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   Tachycardia    Urinary frequency     Past Surgical History:  Procedure Laterality Date   [place] Left 05/07/2016   BREAST BIOPSY Bilateral 1956, 1980   BREAST LUMPECTOMY WITH RADIOACTIVE SEED LOCALIZATION Left 06/16/2016   Procedure: LEFT BREAST LUMPECTOMY WITH RADIOACTIVE SEED LOCALIZATION;  Surgeon: Autumn Messing III, MD;  Location: [place];  Service: General;  Laterality: Left;   COLONOSCOPY     KIDNEY STONE SURGERY     KNEE SURGERY  11/20/2004   TOTAL KNEE ARTHROPLASTY Right 04/07/2022   Procedure: TOTAL KNEE ARTHROPLASTY;  Surgeon: Gaynelle Arabian, MD;  [facility];  Service: Orthopedics;  Laterality: Right;   TOTAL KNEE ARTHROPLASTY Left 09/22/2022   Procedure: TOTAL KNEE ARTHROPLASTY;  Surgeon: Gaynelle Arabian, MD;  [facility];   Service: Orthopedics;  Laterality: Left;   WRIST SURGERY Left 2018    Family History  Problem Relation Age of Onset   Lung cancer Brother    Stroke Mother    Pneumonia Father    Cancer Brother     Social History:  reports that she has never smoked. She has never been exposed to tobacco smoke. She has never used smokeless tobacco. She reports that she does not drink alcohol and does not use drugs.  Allergies:  Allergies  Allergen Reactions   Penicillins Anaphylaxis, Swelling and Rash    Has patient had a PCN reaction causing immediate rash, facial/tongue/throat swelling, SOB or lightheadedness with hypotension: Yes Has patient had a PCN reaction causing severe rash involving mucus membranes or skin necrosis: Yes Has patient had a PCN reaction that required hospitalization No Has patient had a PCN reaction occurring within the last 10 years: No If all of the above answers are "NO", then may proceed with Cephalosporin use.    Azithromycin     PT unsure of reaction    Codeine Other (See Comments)    UNSPECIFIED REACTION Patient was told not to take it.   Levaquin [Levofloxacin] Other (See Comments)    UNSPECIFIED REACTION Patient was told not to take it.   Sulfa Antibiotics Itching and Rash  Medications: I have reviewed the patient's current medications. Scheduled:  allopurinol  100 mg Oral BID   atenolol  50 mg Oral BID   feeding supplement  237 mL Oral Q24H   loratadine  10 mg Oral Daily   multivitamin with minerals  1 tablet Oral Daily   rosuvastatin  5 mg Oral Daily   venlafaxine XR  37.5 mg Oral Daily    Results for orders placed or performed during the hospital encounter of 11/07/22 (from the past 24 hour(s))  CBC with Differential     Status: Abnormal   Collection Time: 11/07/22  8:00 PM  Result Value Ref Range   WBC 5.7 4.0 - 10.5 K/uL   RBC 4.50 3.87 - 5.11 MIL/uL   Hemoglobin 14.1 12.0 - 15.0 g/dL   HCT 45.1 36.0 - 46.0 %   MCV 100.2 (H) 80.0 - 100.0 fL    MCH 31.3 26.0 - 34.0 pg   MCHC 31.3 30.0 - 36.0 g/dL   RDW 14.5 11.5 - 15.5 %   Platelets 229 150 - 400 K/uL   nRBC 0.0 0.0 - 0.2 %   Neutrophils Relative % 77 %   Neutro Abs 4.4 1.7 - 7.7 K/uL   Lymphocytes Relative 11 %   Lymphs Abs 0.6 (L) 0.7 - 4.0 K/uL   Monocytes Relative 9 %   Monocytes Absolute 0.5 0.1 - 1.0 K/uL   Eosinophils Relative 1 %   Eosinophils Absolute 0.1 0.0 - 0.5 K/uL   Basophils Relative 1 %   Basophils Absolute 0.0 0.0 - 0.1 K/uL   Immature Granulocytes 1 %   Abs Immature Granulocytes 0.06 0.00 - 0.07 K/uL  Basic metabolic panel     Status: Abnormal   Collection Time: 11/07/22  8:00 PM  Result Value Ref Range   Sodium 135 135 - 145 mmol/L   Potassium 3.4 (L) 3.5 - 5.1 mmol/L   Chloride 101 98 - 111 mmol/L   CO2 23 22 - 32 mmol/L   Glucose, Bld 100 (H) 70 - 99 mg/dL   BUN 31 (H) 8 - 23 mg/dL   Creatinine, Ser 0.96 0.44 - 1.00 mg/dL   Calcium 10.3 8.9 - 10.3 mg/dL   GFR, Estimated 58 (L) >60 mL/min   Anion gap 11 5 - 15  Protime-INR     Status: Abnormal   Collection Time: 11/07/22  8:00 PM  Result Value Ref Range   Prothrombin Time 15.5 (H) 11.4 - 15.2 seconds   INR 1.2 0.8 - 1.2  Magnesium     Status: None   Collection Time: 11/07/22  8:00 PM  Result Value Ref Range   Magnesium 2.0 1.7 - 2.4 mg/dL  Type and screen [place]     Status: None   Collection Time: 11/07/22 11:58 PM  Result Value Ref Range   ABO/RH(D) A POS    Antibody Screen NOT NEEDED    Sample Expiration 11/07/2022,2359   Urinalysis, Routine w reflex microscopic     Status: Abnormal   Collection Time: 11/08/22 12:26 AM  Result Value Ref Range   Color, Urine STRAW (A) YELLOW   APPearance CLEAR CLEAR   Specific Gravity, Urine 1.012 1.005 - 1.030   pH 5.0 5.0 - 8.0   Glucose, UA NEGATIVE NEGATIVE mg/dL   Hgb urine dipstick NEGATIVE NEGATIVE   Bilirubin Urine NEGATIVE NEGATIVE   Ketones, ur NEGATIVE NEGATIVE mg/dL   Protein, ur NEGATIVE NEGATIVE mg/dL    Nitrite NEGATIVE NEGATIVE  Leukocytes,Ua NEGATIVE NEGATIVE  CBC     Status: Abnormal   Collection Time: 11/08/22  4:14 AM  Result Value Ref Range   WBC 6.6 4.0 - 10.5 K/uL   RBC 3.49 (L) 3.87 - 5.11 MIL/uL   Hemoglobin 11.0 (L) 12.0 - 15.0 g/dL   HCT 34.6 (L) 36.0 - 46.0 %   MCV 99.1 80.0 - 100.0 fL   MCH 31.5 26.0 - 34.0 pg   MCHC 31.8 30.0 - 36.0 g/dL   RDW 14.5 11.5 - 15.5 %   Platelets 186 150 - 400 K/uL   nRBC 0.0 0.0 - 0.2 %  Basic metabolic panel     Status: Abnormal   Collection Time: 11/08/22  4:14 AM  Result Value Ref Range   Sodium 135 135 - 145 mmol/L   Potassium 3.5 3.5 - 5.1 mmol/L   Chloride 103 98 - 111 mmol/L   CO2 25 22 - 32 mmol/L   Glucose, Bld 124 (H) 70 - 99 mg/dL   BUN 21 8 - 23 mg/dL   Creatinine, Ser 0.72 0.44 - 1.00 mg/dL   Calcium 9.5 8.9 - 10.3 mg/dL   GFR, Estimated >60 >60 mL/min   Anion gap 7 5 - 15  Iron and TIBC     Status: None   Collection Time: 11/08/22 10:18 AM  Result Value Ref Range   Iron 42 28 - 170 ug/dL   TIBC 323 250 - 450 ug/dL   Saturation Ratios 13 10.4 - 31.8 %   UIBC 281 ug/dL  Vitamin B12     Status: None   Collection Time: 11/08/22 10:18 AM  Result Value Ref Range   Vitamin B-12 543 180 - 914 pg/mL     X-ray: CLINICAL DATA:  Fall, concern for left hip fracture.   EXAM: DG HIP (WITH OR WITHOUT PELVIS) 2-3V LEFT   COMPARISON:  None Available.   FINDINGS: There is a suspected fracture of the left femoral neck with overlapping. No dislocation. The remaining bony structures are intact. Degenerative changes are present in the lumbar spine.   IMPRESSION: Suspected left femoral neck fracture.     Electronically Signed   By: Brett Fairy M.D.  ROS: As per HPI  Blood pressure 124/66, pulse 100, temperature 97.6 F (36.4 C), resp. rate 16, height '5\' 3"'$  (1.6 m), SpO2 92 %.  Physical Exam: General:  Appears calm and comfortable and is in NAD Eyes:  PERRL, EOMI, normal lids, iris ENT:  HOH, lips & tongue,  dry mucous membranes, appropriate dentition Neck:  no LAD, masses or thyromegaly; no carotid bruits Cardiovascular:  Regularly irregular, no m/r/g. Trace LE edema.  Respiratory:   CTA bilaterally with no wheezes/rales/rhonchi.  Normal respiratory effort. Abdomen:  soft, NT, ND, NABS Back:   normal alignment, no CVAT Skin:  no rash or induration seen on limited exam Musculoskeletal:  LLE: TTP over greater trochanteric process and superior lateral aspect of femur. No bruising or edema of knee or ankle. Sensation intact. Can move toes. Pedal pulses 2+ Lower extremity:  Limited foot exam with no ulcerations.  2+ distal pulses. Psychiatric:  grossly normal mood and affect, speech fluent and appropriate, AOx3 Neurologic:  CN 2-12 grossly intact, moves all extremities in coordinated fashion, sensation intact  Assessment/Plan: Left hip displaced femoral neck fracture  Plan: Will plan to take her to the 1/21 for a left total hip replacement to address her left hip fracture NPO after MN Consent ordered On Eliquis - last dose  Friday 1/19 am  Mauri Pole 11/08/2022, 3:09 PM

## 2022-11-08 NOTE — Progress Notes (Addendum)
Initial Nutrition Assessment  DOCUMENTATION CODES:   Not applicable  INTERVENTION:  Liberalize diet to regular in the context of advanced age MVI with minerals daily Ensure Enlive po daily, each supplement provides 350 kcal and 20 grams of protein. Micronutrient labs ordered by MD pending, monitor for need for replacement (folate, iron, vitamin b12, and vitamin D)  NUTRITION DIAGNOSIS:   Increased nutrient needs related to hip fracture as evidenced by estimated needs.  GOAL:   Patient will meet greater than or equal to 90% of their needs  MONITOR:  Cx  PO intake, Supplement acceptance, Labs  REASON FOR ASSESSMENT:   Consult Hip fracture protocol  ASSESSMENT:   Pt with hx of breast cancer, atrial fibrillation, HTN, HLD, CKD3, gout, PVD, and hx CVA presented to ED with pain after a fall at home. Found to have a left hip fx  Attempted to call pt on room phone, no answer at this time. Will follow-up in-person for nutrition hx and physical exam.   Pt planned to go to OR for repair 1/21. No intake recorded yet in chart, but ~11.7% weight loss is noted in the last ~6 months. Will liberalize diet in the context of advanced age and add nutrition supplements to augment intake and promote recovery from surgery.   Several micronutrient labs pending ordered by MD, will monitor for deficiency and recommend replacement if needed.     Nutritionally Relevant Medications: Scheduled Meds:  rosuvastatin  5 mg Oral Daily   PRN Meds:.diphenhydrAMINE, polyethylene glycol  Labs Reviewed  NUTRITION - FOCUSED PHYSICAL EXAM: Defer to in-person assessment  Diet Order:   Diet Order             Diet NPO time specified  Diet effective midnight           Diet Heart Fluid consistency: Thin  Diet effective now                   EDUCATION NEEDS:   No education needs have been identified at this time  Skin:  Skin Assessment: Reviewed RN Assessment  Last BM:  1/19  Height:   Ht  Readings from Last 1 Encounters:  11/08/22 '5\' 3"'$  (1.6 m)    Weight:   Wt Readings from Last 1 Encounters:  09/22/22 64.4 kg    Ideal Body Weight:  52.3 kg  BMI:  Body mass index is 25.15 kg/m.  Estimated Nutritional Needs:   Kcal:  1500-1700 kcal/d  Protein:  70-90g/d  Fluid:  1.5-1.7L/d    Ranell Patrick, RD, LDN Clinical Dietitian RD pager # available in [facility]  After hours/weekend pager # available in [facility]

## 2022-11-08 NOTE — Progress Notes (Signed)
Patient seen and evaluated by Dr. Alvan Dame. Left femoral neck fracture which will require left total hip arthroplasty. Plan for OR Tomorrow. Regular diet today, pain control. NPO after midnight.  Full consult note to follow.  Costella Hatcher, PA-C

## 2022-11-08 NOTE — Progress Notes (Signed)
[facility] Progress Note Patient: Kathryn Ayala NIO:[id number] DOB: 02/01/1935 DOA: 11/07/2022  DOS: the patient was seen and examined on 11/08/2022  Brief hospital course: PMH of HTN, HLD, CKD 3A, breast cancer, PAF on Eliquis, pulmonary HTN, fibromyalgia, PVD.  Presented to hospital after mechanical fall.  No head injury or neck injury.  Found to have left femoral neck fracture.  Orthopedic consulted. Assessment and Plan: Left femoral neck fracture, closed. After mechanical fall. Recently had 2 knee arthroplasties and tolerated them well. Does not appear to be at a high risk for surgery compared to those 2 surgeries which the patient has tolerated well. Continue pain control. Orthopedic consulted.  Surgery scheduled for tomorrow. Monitor for bleeding risk.  Mechanical fall. PT OT consult. Likely associated with her neuropathy. Monitor.  Possible neuropathy.  Likely nondiabetic. Patient reports neuropathy-like symptoms. Will check vitamin G18 and folic acid level. PT OT consult.  Hypokalemia. Corrected.  Paroxysmal A-fib. On Eliquis.  Currently on hold.  Last dose 1/19 in the morning. Continue rate control medication.  CKD 3A. Renal function stable. Monitor.  HTN. Blood pressure stable.  Anxiety. Resuming home regimen. HLD. Continue statin. History of gout. Continue allopurinol.   Subjective: Pain well-controlled.  No nausea no vomiting.  No fever no chills.  No headache no neck pain.  No dizziness or lightheadedness.  Physical Exam: General: in Mild distress, No Rash Cardiovascular: S1 and S2 Present, No Murmur Respiratory: Good respiratory effort, Bilateral Air entry present. No Crackles, No wheezes Abdomen: Bowel Sound present, No tenderness Extremities: No edema Neuro: Alert and oriented x3, no new focal deficit  Data Reviewed: I have Reviewed nursing notes, Vitals, and Lab results. Since last encounter, pertinent lab results CBC and BMP   . I  have ordered test including CBC and BMP  .   Disposition: Status is: Inpatient Remains inpatient appropriate because: Need for surgery  SCDs Start: 11/07/22 2304   Family Communication: Niece at bedside Level of care: Med-Surg Switch from telemetry to [place]. Vitals:   11/08/22 1344 11/08/22 1501 11/08/22 1724 11/08/22 1727  BP:  124/66 124/66 112/70  Pulse:  100 100 79  Resp:  '16 16 18  '$ Temp:  97.6 F (36.4 C) 97.6 F (36.4 C) 99.1 F (37.3 C)  TempSrc:   Oral Oral  SpO2:  92%  100%  Weight:   64.4 kg   Height: '5\' 3"'$  (1.6 m)  '5\' 3"'$  (1.6 m)      Author: Berle Mull, MD 11/08/2022 6:22 PM  Please look on [email or web address] to find out who is on call.

## 2022-11-08 NOTE — ED Notes (Signed)
Patient states she has to lay flat to help with pain O2 drops to 85% RA RN applied 2L O2

## 2022-11-08 NOTE — Plan of Care (Signed)
  Problem: Education: Goal: Knowledge of General Education information will improve Description: Including pain rating scale, medication(s)/side effects and non-pharmacologic comfort measures Outcome: Progressing   Problem: Activity: Goal: Risk for activity intolerance will decrease Outcome: Progressing   Problem: Pain Managment: Goal: General experience of comfort will improve Outcome: Progressing

## 2022-11-09 ENCOUNTER — Inpatient Hospital Stay (HOSPITAL_COMMUNITY): Payer: Medicare PPO

## 2022-11-09 ENCOUNTER — Other Ambulatory Visit: Payer: Self-pay

## 2022-11-09 ENCOUNTER — Inpatient Hospital Stay (HOSPITAL_COMMUNITY): Payer: Medicare PPO | Admitting: Anesthesiology

## 2022-11-09 ENCOUNTER — Encounter (HOSPITAL_COMMUNITY)
Admission: EM | Disposition: A | Discharge status: Home / Self Care | Payer: Self-pay | Source: Home / Self Care | Attending: Internal Medicine

## 2022-11-09 ENCOUNTER — Encounter (HOSPITAL_COMMUNITY): Payer: Self-pay | Admitting: Family Medicine

## 2022-11-09 DIAGNOSIS — I10 Essential (primary) hypertension: Secondary | ICD-10-CM | POA: Diagnosis not present

## 2022-11-09 DIAGNOSIS — Z96642 Presence of left artificial hip joint: Secondary | ICD-10-CM

## 2022-11-09 DIAGNOSIS — M1612 Unilateral primary osteoarthritis, left hip: Secondary | ICD-10-CM | POA: Diagnosis not present

## 2022-11-09 DIAGNOSIS — F418 Other specified anxiety disorders: Secondary | ICD-10-CM

## 2022-11-09 DIAGNOSIS — J189 Pneumonia, unspecified organism: Secondary | ICD-10-CM

## 2022-11-09 DIAGNOSIS — I4891 Unspecified atrial fibrillation: Secondary | ICD-10-CM

## 2022-11-09 DIAGNOSIS — S72002A Fracture of unspecified part of neck of left femur, initial encounter for closed fracture: Secondary | ICD-10-CM | POA: Diagnosis not present

## 2022-11-09 HISTORY — PX: TOTAL HIP ARTHROPLASTY: SHX124

## 2022-11-09 LAB — CBC
HCT: 39.1 % (ref 36.0–46.0)
Hemoglobin: 12.5 g/dL (ref 12.0–15.0)
MCH: 31.7 pg (ref 26.0–34.0)
MCHC: 32 g/dL (ref 30.0–36.0)
MCV: 99.2 fL (ref 80.0–100.0)
Platelets: 167 10*3/uL (ref 150–400)
RBC: 3.94 MIL/uL (ref 3.87–5.11)
RDW: 14.5 % (ref 11.5–15.5)
WBC: 7.3 10*3/uL (ref 4.0–10.5)
nRBC: 0 % (ref 0.0–0.2)

## 2022-11-09 LAB — BASIC METABOLIC PANEL
Anion gap: 8 (ref 5–15)
BUN: 17 mg/dL (ref 8–23)
CO2: 26 mmol/L (ref 22–32)
Calcium: 9.6 mg/dL (ref 8.9–10.3)
Chloride: 102 mmol/L (ref 98–111)
Creatinine, Ser: 0.71 mg/dL (ref 0.44–1.00)
GFR, Estimated: >60 mL/min (ref >60)
Glucose, Bld: 121 mg/dL — ABNORMAL HIGH (ref 70–99)
Potassium: 4.4 mmol/L (ref 3.5–5.1)
Sodium: 136 mmol/L (ref 135–145)

## 2022-11-09 LAB — MAGNESIUM: Magnesium: 1.5 mg/dL — ABNORMAL LOW (ref 1.7–2.4)

## 2022-11-09 SURGERY — ARTHROPLASTY, HIP, TOTAL, ANTERIOR APPROACH
Anesthesia: General | Site: Hip | Laterality: Left

## 2022-11-09 MED ORDER — DOCUSATE SODIUM 100 MG PO CAPS
100.0000 mg | ORAL_CAPSULE | Freq: Two times a day (BID) | ORAL | Status: DC
  Administered 2022-11-09 – 2022-11-11 (×4): 100 mg via ORAL
  Filled 2022-11-09 (×4): qty 1

## 2022-11-09 MED ORDER — DEXAMETHASONE SODIUM PHOSPHATE 10 MG/ML IJ SOLN
INTRAMUSCULAR | Status: AC
  Filled 2022-11-09: qty 1

## 2022-11-09 MED ORDER — DEXAMETHASONE SODIUM PHOSPHATE 10 MG/ML IJ SOLN
INTRAMUSCULAR | Status: DC | PRN
  Administered 2022-11-09: 10 mg via INTRAVENOUS

## 2022-11-09 MED ORDER — ONDANSETRON HCL 4 MG PO TABS
4.0000 mg | ORAL_TABLET | Freq: Four times a day (QID) | ORAL | Status: DC | PRN
  Filled 2022-11-09: qty 1

## 2022-11-09 MED ORDER — ORAL CARE MOUTH RINSE: 15.0000 mL | OROMUCOSAL | Status: DC | PRN

## 2022-11-09 MED ORDER — SODIUM CHLORIDE 0.9 % IV SOLN: INTRAVENOUS | Status: DC

## 2022-11-09 MED ORDER — ALBUMIN HUMAN 5 % IV SOLN: INTRAVENOUS | Status: DC | PRN

## 2022-11-09 MED ORDER — EPINEPHRINE PF 1 MG/ML IJ SOLN
INTRAMUSCULAR | Status: DC | PRN
  Administered 2022-11-09: .15 mL

## 2022-11-09 MED ORDER — LIDOCAINE HCL (PF) 2 % IJ SOLN
INTRAMUSCULAR | Status: AC
  Filled 2022-11-09: qty 5

## 2022-11-09 MED ORDER — MENTHOL 3 MG MT LOZG
1.0000 | LOZENGE | OROMUCOSAL | Status: DC | PRN
  Administered 2022-11-10: 3 mg via ORAL
  Filled 2022-11-09 (×4): qty 9

## 2022-11-09 MED ORDER — POLYETHYLENE GLYCOL 3350 17 G PO PACK
17.0000 g | PACK | Freq: Two times a day (BID) | ORAL | Status: DC
  Administered 2022-11-10 (×2): 17 g via ORAL
  Filled 2022-11-09 (×3): qty 1

## 2022-11-09 MED ORDER — TRANEXAMIC ACID-NACL 1000-0.7 MG/100ML-% IV SOLN
1000.0000 mg | Freq: Once | INTRAVENOUS | Status: AC
  Administered 2022-11-09: 1000 mg via INTRAVENOUS
  Filled 2022-11-09: qty 100

## 2022-11-09 MED ORDER — ROCURONIUM BROMIDE 10 MG/ML (PF) SYRINGE
PREFILLED_SYRINGE | INTRAVENOUS | Status: DC | PRN
  Administered 2022-11-09: 40 mg via INTRAVENOUS

## 2022-11-09 MED ORDER — PHENOL 1.4 % MT LIQD: 1.0000 | OROMUCOSAL | Status: DC | PRN

## 2022-11-09 MED ORDER — PROPOFOL 10 MG/ML IV BOLUS
INTRAVENOUS | Status: AC
  Filled 2022-11-09: qty 20

## 2022-11-09 MED ORDER — VANCOMYCIN HCL 1000 MG IV SOLR
INTRAVENOUS | Status: DC | PRN
  Administered 2022-11-09: 1000 mg via INTRAVENOUS

## 2022-11-09 MED ORDER — LIDOCAINE 2% (20 MG/ML) 5 ML SYRINGE
INTRAMUSCULAR | Status: DC | PRN
  Administered 2022-11-09: 60 mg via INTRAVENOUS

## 2022-11-09 MED ORDER — METOCLOPRAMIDE HCL 5 MG/ML IJ SOLN: 5.0000 mg | Freq: Three times a day (TID) | INTRAMUSCULAR | Status: DC | PRN

## 2022-11-09 MED ORDER — MAGNESIUM SULFATE 2 GM/50ML IV SOLN
2.0000 g | Freq: Once | INTRAVENOUS | Status: AC
  Administered 2022-11-09: 2 g via INTRAVENOUS
  Filled 2022-11-09: qty 50

## 2022-11-09 MED ORDER — VANCOMYCIN HCL IN DEXTROSE 1-5 GM/200ML-% IV SOLN
INTRAVENOUS | Status: AC
  Filled 2022-11-09: qty 200

## 2022-11-09 MED ORDER — BUPIVACAINE HCL 0.25 % IJ SOLN
INTRAMUSCULAR | Status: AC
  Filled 2022-11-09: qty 1

## 2022-11-09 MED ORDER — TRANEXAMIC ACID-NACL 1000-0.7 MG/100ML-% IV SOLN
INTRAVENOUS | Status: AC
  Filled 2022-11-09: qty 100

## 2022-11-09 MED ORDER — KETOROLAC TROMETHAMINE 30 MG/ML IJ SOLN
INTRAMUSCULAR | Status: DC | PRN
  Administered 2022-11-09: 30 mg

## 2022-11-09 MED ORDER — METHOCARBAMOL 500 MG IVPB - SIMPLE MED
500.0000 mg | Freq: Four times a day (QID) | INTRAVENOUS | Status: DC | PRN
  Administered 2022-11-09: 500 mg via INTRAVENOUS

## 2022-11-09 MED ORDER — METHOCARBAMOL 500 MG PO TABS
500.0000 mg | ORAL_TABLET | Freq: Four times a day (QID) | ORAL | Status: DC | PRN
  Administered 2022-11-09 – 2022-11-11 (×2): 500 mg via ORAL
  Filled 2022-11-09 (×2): qty 1

## 2022-11-09 MED ORDER — ONDANSETRON HCL 4 MG/2ML IJ SOLN: 4.0000 mg | Freq: Four times a day (QID) | INTRAMUSCULAR | Status: DC | PRN

## 2022-11-09 MED ORDER — 0.9 % SODIUM CHLORIDE (POUR BTL) OPTIME
TOPICAL | Status: DC | PRN
  Administered 2022-11-09: 1000 mL

## 2022-11-09 MED ORDER — PHENYLEPHRINE HCL-NACL 20-0.9 MG/250ML-% IV SOLN
INTRAVENOUS | Status: AC
  Filled 2022-11-09: qty 250

## 2022-11-09 MED ORDER — MORPHINE SULFATE (PF) 2 MG/ML IV SOLN: 0.5000 mg | INTRAVENOUS | Status: DC | PRN

## 2022-11-09 MED ORDER — SODIUM CHLORIDE (PF) 0.9 % IJ SOLN
INTRAMUSCULAR | Status: DC | PRN
  Administered 2022-11-09: 30 mL via INTRAVENOUS

## 2022-11-09 MED ORDER — B COMPLEX-C PO TABS
1.0000 | ORAL_TABLET | Freq: Every day | ORAL | Status: DC
  Administered 2022-11-10 – 2022-11-11 (×2): 1 via ORAL
  Filled 2022-11-09 (×2): qty 1

## 2022-11-09 MED ORDER — LACTATED RINGERS IV SOLN: INTRAVENOUS | Status: DC | PRN

## 2022-11-09 MED ORDER — HYDROCODONE-ACETAMINOPHEN 5-325 MG PO TABS
1.0000 | ORAL_TABLET | Freq: Four times a day (QID) | ORAL | Status: DC | PRN
  Administered 2022-11-09: 1 via ORAL
  Filled 2022-11-09: qty 1

## 2022-11-09 MED ORDER — METOCLOPRAMIDE HCL 5 MG PO TABS: 5.0000 mg | ORAL_TABLET | Freq: Three times a day (TID) | ORAL | Status: DC | PRN

## 2022-11-09 MED ORDER — ONDANSETRON HCL 4 MG/2ML IJ SOLN
INTRAMUSCULAR | Status: AC
  Filled 2022-11-09: qty 2

## 2022-11-09 MED ORDER — VANCOMYCIN HCL IN DEXTROSE 1-5 GM/200ML-% IV SOLN
1000.0000 mg | Freq: Two times a day (BID) | INTRAVENOUS | Status: AC
  Administered 2022-11-09: 1000 mg via INTRAVENOUS
  Filled 2022-11-09: qty 200

## 2022-11-09 MED ORDER — FENTANYL CITRATE (PF) 100 MCG/2ML IJ SOLN
INTRAMUSCULAR | Status: DC | PRN
  Administered 2022-11-09 (×2): 50 ug via INTRAVENOUS
  Administered 2022-11-09: 25 ug via INTRAVENOUS
  Administered 2022-11-09: 50 ug via INTRAVENOUS
  Administered 2022-11-09: 25 ug via INTRAVENOUS

## 2022-11-09 MED ORDER — FENTANYL CITRATE PF 50 MCG/ML IJ SOSY
PREFILLED_SYRINGE | INTRAMUSCULAR | Status: AC
  Filled 2022-11-09: qty 1

## 2022-11-09 MED ORDER — FENTANYL CITRATE (PF) 100 MCG/2ML IJ SOLN
INTRAMUSCULAR | Status: AC
  Filled 2022-11-09: qty 2

## 2022-11-09 MED ORDER — PROPOFOL 10 MG/ML IV BOLUS
INTRAVENOUS | Status: DC | PRN
  Administered 2022-11-09: 70 mg via INTRAVENOUS

## 2022-11-09 MED ORDER — STERILE WATER FOR IRRIGATION IR SOLN
Status: DC | PRN
  Administered 2022-11-09: 2000 mL

## 2022-11-09 MED ORDER — FENTANYL CITRATE PF 50 MCG/ML IJ SOSY: 25.0000 ug | PREFILLED_SYRINGE | INTRAMUSCULAR | Status: DC | PRN

## 2022-11-09 MED ORDER — TRAMADOL HCL 50 MG PO TABS
50.0000 mg | ORAL_TABLET | Freq: Four times a day (QID) | ORAL | Status: DC | PRN
  Administered 2022-11-09 – 2022-11-11 (×4): 50 mg via ORAL
  Filled 2022-11-09 (×4): qty 1

## 2022-11-09 MED ORDER — DEXAMETHASONE SODIUM PHOSPHATE 10 MG/ML IJ SOLN
10.0000 mg | Freq: Once | INTRAMUSCULAR | Status: AC
  Administered 2022-11-10: 10 mg via INTRAVENOUS
  Filled 2022-11-09: qty 1

## 2022-11-09 MED ORDER — PHENYLEPHRINE 80 MCG/ML (10ML) SYRINGE FOR IV PUSH (FOR BLOOD PRESSURE SUPPORT)
PREFILLED_SYRINGE | INTRAVENOUS | Status: DC | PRN
  Administered 2022-11-09: 80 ug via INTRAVENOUS
  Administered 2022-11-09 (×6): 160 ug via INTRAVENOUS
  Administered 2022-11-09: 80 ug via INTRAVENOUS
  Administered 2022-11-09: 160 ug via INTRAVENOUS

## 2022-11-09 MED ORDER — ACETAMINOPHEN 325 MG PO TABS
325.0000 mg | ORAL_TABLET | Freq: Four times a day (QID) | ORAL | Status: DC | PRN
  Administered 2022-11-11: 650 mg via ORAL
  Filled 2022-11-09: qty 2

## 2022-11-09 MED ORDER — DIPHENHYDRAMINE HCL 12.5 MG/5ML PO ELIX: 12.5000 mg | ORAL_SOLUTION | ORAL | Status: DC | PRN

## 2022-11-09 MED ORDER — ROCURONIUM BROMIDE 10 MG/ML (PF) SYRINGE
PREFILLED_SYRINGE | INTRAVENOUS | Status: AC
  Filled 2022-11-09: qty 10

## 2022-11-09 MED ORDER — DROPERIDOL 2.5 MG/ML IJ SOLN: 0.6250 mg | Freq: Once | INTRAMUSCULAR | Status: DC | PRN

## 2022-11-09 MED ORDER — ONDANSETRON HCL 4 MG/2ML IJ SOLN
INTRAMUSCULAR | Status: DC | PRN
  Administered 2022-11-09: 4 mg via INTRAVENOUS

## 2022-11-09 MED ORDER — SODIUM CHLORIDE (PF) 0.9 % IJ SOLN
INTRAMUSCULAR | Status: AC
  Filled 2022-11-09: qty 50

## 2022-11-09 MED ORDER — PHENYLEPHRINE 80 MCG/ML (10ML) SYRINGE FOR IV PUSH (FOR BLOOD PRESSURE SUPPORT)
PREFILLED_SYRINGE | INTRAVENOUS | Status: AC
  Filled 2022-11-09: qty 10

## 2022-11-09 MED ORDER — BISACODYL 10 MG RE SUPP: 10.0000 mg | Freq: Every day | RECTAL | Status: DC | PRN

## 2022-11-09 MED ORDER — APIXABAN 5 MG PO TABS
5.0000 mg | ORAL_TABLET | Freq: Two times a day (BID) | ORAL | Status: DC
  Administered 2022-11-10 – 2022-11-11 (×3): 5 mg via ORAL
  Filled 2022-11-09 (×3): qty 1

## 2022-11-09 MED ORDER — SUGAMMADEX SODIUM 200 MG/2ML IV SOLN
INTRAVENOUS | Status: DC | PRN
  Administered 2022-11-09: 200 mg via INTRAVENOUS

## 2022-11-09 MED ORDER — BUPIVACAINE HCL (PF) 0.25 % IJ SOLN
INTRAMUSCULAR | Status: DC | PRN
  Administered 2022-11-09: 30 mL

## 2022-11-09 MED ORDER — EPINEPHRINE PF 1 MG/ML IJ SOLN
INTRAMUSCULAR | Status: AC
  Filled 2022-11-09: qty 1

## 2022-11-09 MED ORDER — KETOROLAC TROMETHAMINE 30 MG/ML IJ SOLN
INTRAMUSCULAR | Status: AC
  Filled 2022-11-09: qty 1

## 2022-11-09 SURGICAL SUPPLY — 40 items
BAG COUNTER SPONGE SURGICOUNT (BAG) IMPLANT
BAG DECANTER FOR FLEXI CONT (MISCELLANEOUS) IMPLANT
BAG ZIPLOCK 12X15 (MISCELLANEOUS) IMPLANT
BLADE SAG 18X100X1.27 (BLADE) ×1 IMPLANT
COVER PERINEAL POST (MISCELLANEOUS) ×1 IMPLANT
COVER SURGICAL LIGHT HANDLE (MISCELLANEOUS) ×1 IMPLANT
CUP ACET PINNACLE SECTR 50MM (Hips) IMPLANT
DERMABOND ADVANCED .7 DNX12 (GAUZE/BANDAGES/DRESSINGS) ×1 IMPLANT
DRAPE FOOT SWITCH (DRAPES) ×1 IMPLANT
DRAPE STERI IOBAN 125X83 (DRAPES) ×1 IMPLANT
DRAPE U-SHAPE 47X51 STRL (DRAPES) ×2 IMPLANT
DRESSING AQUACEL AG SP 3.5X10 (GAUZE/BANDAGES/DRESSINGS) ×1 IMPLANT
DRSG AQUACEL AG ADV 3.5X10 (GAUZE/BANDAGES/DRESSINGS) IMPLANT
DRSG AQUACEL AG SP 3.5X10 (GAUZE/BANDAGES/DRESSINGS) ×1
DURAPREP 26ML APPLICATOR (WOUND CARE) ×1 IMPLANT
ELECT REM PT RETURN 15FT ADLT (MISCELLANEOUS) ×1 IMPLANT
GLOVE BIO SURGEON STRL SZ 6 (GLOVE) ×1 IMPLANT
GLOVE BIOGEL PI IND STRL 6.5 (GLOVE) ×1 IMPLANT
GLOVE BIOGEL PI IND STRL 7.5 (GLOVE) ×1 IMPLANT
GLOVE ORTHO TXT STRL SZ7.5 (GLOVE) ×2 IMPLANT
GOWN STRL REUS W/ TWL LRG LVL3 (GOWN DISPOSABLE) ×2 IMPLANT
GOWN STRL REUS W/TWL LRG LVL3 (GOWN DISPOSABLE) ×2
HEAD FEM STD 32X+1 STRL (Hips) IMPLANT
HOLDER FOLEY CATH W/STRAP (MISCELLANEOUS) ×1 IMPLANT
KIT TURNOVER KIT A (KITS) IMPLANT
LINER ACET PNNCL PLUS4 NEUTRAL (Hips) IMPLANT
PACK ANTERIOR HIP CUSTOM (KITS) ×1 IMPLANT
PINNACLE PLUS 4 NEUTRAL (Hips) ×1 IMPLANT
PINNACLE SECTOR CUP 50MM (Hips) ×1 IMPLANT
SCREW 6.5MMX35MM (Screw) IMPLANT
STEM FEMORAL SZ5 HIGH ACTIS (Stem) IMPLANT
SUT MNCRL AB 4-0 PS2 18 (SUTURE) ×1 IMPLANT
SUT STRATAFIX 0 PDS 27 VIOLET (SUTURE) ×1
SUT VIC AB 1 CT1 36 (SUTURE) ×3 IMPLANT
SUT VIC AB 2-0 CT1 27 (SUTURE) ×2
SUT VIC AB 2-0 CT1 TAPERPNT 27 (SUTURE) ×2 IMPLANT
SUTURE STRATFX 0 PDS 27 VIOLET (SUTURE) ×1 IMPLANT
TRAY FOLEY MTR SLVR 16FR STAT (SET/KITS/TRAYS/PACK) IMPLANT
TUBE SUCTION HIGH CAP CLEAR NV (SUCTIONS) ×1 IMPLANT
WATER STERILE IRR 1000ML POUR (IV SOLUTION) ×1 IMPLANT

## 2022-11-09 NOTE — Anesthesia Postprocedure Evaluation (Signed)
Anesthesia Post Note  Patient: Kathryn Ayala  Procedure(s) Performed: TOTAL HIP ARTHROPLASTY ANTERIOR APPROACH (Left: Hip)     Patient location during evaluation: PACU Anesthesia Type: General Level of consciousness: sedated and patient cooperative Pain management: pain level controlled Vital Signs Assessment: post-procedure vital signs reviewed and stable Respiratory status: spontaneous breathing Cardiovascular status: stable Anesthetic complications: no   No notable events documented.  Last Vitals:  Vitals:   11/09/22 1215 11/09/22 1244  BP: 118/61 101/70  Pulse: 95 83  Resp: 18 16  Temp:  36.4 C  SpO2: 96%     Last Pain:  Vitals:   11/09/22 1414  TempSrc:   PainSc: [place]

## 2022-11-09 NOTE — Anesthesia Preprocedure Evaluation (Addendum)
Anesthesia Evaluation  Patient identified by MRN, date of birth, ID band  Reviewed: Allergy & Precautions, NPO status , Patient's Chart, lab work & pertinent test results  Airway Mallampati: II  TM Distance: >3 FB Neck ROM: Full    Dental  (+) Dental Advisory Given, Teeth Intact   Pulmonary pneumonia, resolved   Pulmonary exam normal        Cardiovascular hypertension, Pt. on medications and Pt. on home beta blockers + Peripheral Vascular Disease  + dysrhythmias Atrial Fibrillation + Valvular Problems/Murmurs (Severe TR) MR  Rhythm:Irregular Rate:Normal + Systolic murmurs Echocardiogram 07/24/2022:  Normal LV systolic function with visual EF 60-65%. Left ventricle cavity  is normal in size. Normal left ventricular wall thickness. Normal global  wall motion. Indeterminate diastolic filling pattern. Calculated EF 69%.  Left atrial cavity is severely dilated at 78.3 ml/m^2.  Right atrial cavity is severely dilated.  Right ventricle cavity is mildly dilated. Normal right ventricular function.  Structurally normal trileaflet aortic valve.  Mild (Grade I) aortic regurgitation.  Moderate to severe mitral regurgitation. Mild mitral valve leaflet calcification.  Structurally normal tricuspid valve.  Severe tricuspid regurgitation.  Moderate pulmonary hypertension. RVSP measures 53 mmHg.  Structurally normal pulmonic valve.  Mild pulmonic regurgitation.  IVC is dilated with respiratory variation.  No significant change compared to 08/2021.     Neuro/Psych  PSYCHIATRIC DISORDERS Anxiety Depression    CVA    GI/Hepatic negative GI ROS, Neg liver ROS,,,  Endo/Other  negative endocrine ROS    Renal/GU Renal disease     Musculoskeletal  (+) Arthritis , Osteoarthritis,  Fibromyalgia -  Abdominal Normal abdominal exam  (+)   Peds  Hematology  (+) Blood dyscrasia, anemia   Anesthesia Other Findings   Reproductive/Obstetrics                              Anesthesia Physical Anesthesia Plan  ASA: 3  Anesthesia Plan: General   Post-op Pain Management: Ofirmev IV (intra-op)*   Induction: Intravenous  PONV Risk Score and Plan: 4 or greater and Ondansetron, Dexamethasone and Treatment may vary due to age or medical condition  Airway Management Planned: Oral ETT  Additional Equipment: None  Intra-op Plan:   Post-operative Plan: Extubation in OR  Informed Consent: I have reviewed the patients History and Physical, chart, labs and discussed the procedure including the risks, benefits and alternatives for the proposed anesthesia with the patient or authorized representative who has indicated his/her understanding and acceptance.   Patient has DNR.  Discussed DNR with patient.   Dental advisory given  Plan Discussed with: CRNA  Anesthesia Plan Comments: (Medical code only. No chest compressions.   See PAT note 09/16/2022)       Anesthesia Quick Evaluation

## 2022-11-09 NOTE — Plan of Care (Signed)
  Problem: Education: Goal: Knowledge of General Education information will improve Description: Including pain rating scale, medication(s)/side effects and non-pharmacologic comfort measures Outcome: Progressing   Problem: Activity: Goal: Risk for activity intolerance will decrease Outcome: Progressing   Problem: Pain Managment: Goal: General experience of comfort will improve Outcome: Progressing

## 2022-11-09 NOTE — Progress Notes (Signed)
Patient ID: Kathryn Ayala, female   DOB: May 04, 1935, 88 y.o.   MRN: [id number] Subjective: Day of Surgery Procedure(s) (LRB): TOTAL HIP ARTHROPLASTY ANTERIOR APPROACH (Left)    Patient reports pain as moderate.  Objective:   VITALS:   Vitals:   11/09/22 0028 11/09/22 0539  BP: 103/60 139/73  Pulse: 80 82  Resp: 18 20  Temp: 99.4 F (37.4 C) 98.4 F (36.9 C)  SpO2: 98% 98%    Neurovascular intact  LABS Recent Labs    11/07/22 2000 11/08/22 0414 11/09/22 0438  HGB 14.1 11.0* 12.5  HCT 45.1 34.6* 39.1  WBC 5.7 6.6 7.3  PLT 229 186 167    Recent Labs    11/07/22 2000 11/08/22 0414 11/09/22 0438  NA 135 135 136  K 3.4* 3.5 4.4  BUN 31* 21 17  CREATININE 0.96 0.72 0.71  GLUCOSE 100* 124* 121*    Recent Labs    11/07/22 2000  INR 1.2     Assessment/Plan: Day of Surgery Procedure(s) (LRB): TOTAL HIP ARTHROPLASTY ANTERIOR APPROACH (Left)   Plan: To OR today for left THR for management of her left hip fracture NPO Post op orders to follow

## 2022-11-09 NOTE — Discharge Instructions (Signed)

## 2022-11-09 NOTE — Interval H&P Note (Signed)
History and Physical Interval Note:  11/09/2022 8:55 AM  Kathryn Ayala  has presented today for surgery, with the diagnosis of LEFT HIP FRACTURE.  The various methods of treatment have been discussed with the patient and family. After consideration of risks, benefits and other options for treatment, the patient has consented to  Procedure(s): TOTAL HIP ARTHROPLASTY ANTERIOR APPROACH (Left) as a surgical intervention.  The patient's history has been reviewed, patient examined, no change in status, stable for surgery.  I have reviewed the patient's chart and labs.  Questions were answered to the patient's satisfaction.     Mauri Pole

## 2022-11-09 NOTE — Progress Notes (Signed)
[facility] Progress Note Patient: Kathryn Ayala IWL:[id number] DOB: 1935-09-20 DOA: 11/07/2022  DOS: the patient was seen and examined on 11/09/2022  Brief hospital course: PMH of HTN, HLD, CKD 3A, breast cancer, PAF on Eliquis, pulmonary HTN, fibromyalgia, PVD.  Presented to hospital after mechanical fall.  No head injury or neck injury.  Found to have left femoral neck fracture.  Orthopedic consulted.  Underwent left total hip replacement through anterior approach on 1/21.  Now monitor postop recovery. Assessment and Plan: Left femoral neck fracture, closed. After mechanical fall. Recently had 2 knee arthroplasties and tolerated them well. Does not appear to be at a high risk for surgery compared to those 2 surgeries which the patient has tolerated well. Continue pain control. Orthopedic consulted.  Underwent left hip total replacement through anterior approach on 1/21. Monitor for bleeding risk.  Resumption of Eliquis per orthopedics. Weightbearing and pain control also per orthopedics.   Mechanical fall. PT OT consult. Likely associated with her neuropathy. Monitor.   Possible neuropathy.  Likely nondiabetic. Patient reports neuropathy-like symptoms. Normal vitamin R74 and folic acid level. PT OT consult.   Hypokalemia. Corrected.   Paroxysmal A-fib. On Eliquis.  Currently on hold.  Last dose 1/19 in the morning.  Resumption per orthopedics. Continue rate control medication.   CKD 3A. Renal function stable. Monitor.   HTN. Blood pressure stable.   Anxiety. Resuming home regimen.  HLD. Continue statin.  History of gout. Continue allopurinol.   Subjective: No nausea no vomiting no fever no chills.  Pain well-controlled.  Seen after the surgery.  Eating diet.  Reports some throat pain.  Physical Exam: General: in Mild distress, No Rash Cardiovascular: S1 and S2 Present, No Murmur Respiratory: Good respiratory effort, Bilateral Air entry present. No  Crackles, No wheezes Abdomen: Bowel Sound present, No tenderness Extremities: No edema Neuro: Alert and oriented x3, no new focal deficit  Data Reviewed: I have Reviewed nursing notes, Vitals, and Lab results. Since last encounter, pertinent lab results CBC and BMP   . I have ordered test including CBC and BMP  .   Disposition: Status is: Inpatient  SCDs Start: 11/09/22 1247 Place TED hose Start: 11/09/22 1247 SCDs Start: 11/07/22 2304 apixaban (ELIQUIS) tablet 5 mg   Family Communication: Daughter is at bedside. Level of care: Med-Surg Vitals:   11/09/22 1145 11/09/22 1200 11/09/22 1215 11/09/22 1244  BP: 131/68 119/68 118/61 101/70  Pulse: 98 87 95 83  Resp: '19 19 18 16  '$ Temp:    97.6 F (36.4 C)  TempSrc:      SpO2: 93% 94% 96%   Weight:      Height:         Author: Berle Mull, MD 11/09/2022 1:56 PM  Please look on [email or web address] to find out who is on call.

## 2022-11-09 NOTE — Anesthesia Procedure Notes (Signed)
Procedure Name: Intubation Date/Time: 11/09/2022 9:39 AM  Performed by: Genelle Bal, CRNAPre-anesthesia Checklist: Patient identified, Emergency Drugs available, Suction available and Patient being monitored Patient Re-evaluated:Patient Re-evaluated prior to induction Oxygen Delivery Method: Circle system utilized Preoxygenation: Pre-oxygenation with 100% oxygen Induction Type: IV induction Ventilation: Mask ventilation without difficulty Laryngoscope Size: Miller and 2 Grade View: Grade I Tube type: Oral Tube size: 7.0 mm Number of attempts: 1 Airway Equipment and Method: Stylet and Oral airway Placement Confirmation: ETT inserted through vocal cords under direct vision, positive ETCO2 and breath sounds checked- equal and bilateral Secured at: 20 cm Tube secured with: Tape Dental Injury: Teeth and Oropharynx as per pre-operative assessment

## 2022-11-09 NOTE — Transfer of Care (Signed)
Immediate Anesthesia Transfer of Care Note  Patient: Kathryn Ayala  Procedure(s) Performed: TOTAL HIP ARTHROPLASTY ANTERIOR APPROACH (Left: Hip)  Patient Location: PACU  Anesthesia Type:General  Level of Consciousness: awake, alert , and oriented  Airway & Oxygen Therapy: Patient Spontanous Breathing and Patient connected to face mask oxygen  Post-op Assessment: Report given to RN and Post -op Vital signs reviewed and stable  Post vital signs: Reviewed and stable  Last Vitals:  Vitals Value Taken Time  BP 115/58 11/09/22 1112  Temp 36.9 C 11/09/22 1112  Pulse    Resp 18 11/09/22 1114  SpO2 97%   Vitals shown include unvalidated device data.  Last Pain:  Vitals:   11/09/22 0753  TempSrc:   PainSc: Asleep      Patients Stated Pain Goal: 3 (17/47/15 9539)  Complications: No notable events documented.

## 2022-11-09 NOTE — Op Note (Signed)
NAME:  Kathryn Ayala                ACCOUNT NO.: [id number]      MEDICAL RECORD NO.: [id number]      FACILITY:  [facility]      PHYSICIAN:  Mauri Pole  DATE OF BIRTH:  05-04-1935     DATE OF PROCEDURE:  11/09/2022                                 OPERATIVE REPORT         PREOPERATIVE DIAGNOSIS: Left  hip osteoarthritis.      POSTOPERATIVE DIAGNOSIS:  Left hip osteoarthritis.      PROCEDURE:  Left total hip replacement through an anterior approach   utilizing DePuy THR system, component size 50 mm pinnacle cup, a size 32+4 neutral   Altrex liner, a size 5Hi Actis stem with a 32+1 Articuleze metal head ball.      SURGEON:  Pietro Cassis. Alvan Dame, M.D.      ASSISTANT:  Costella Hatcher, PA-C     ANESTHESIA:  General.      SPECIMENS:  None.      COMPLICATIONS:  None.      BLOOD LOSS:  850 cc     DRAINS:  None.      INDICATION OF THE PROCEDURE:  Kathryn Ayala is a 88 y.o. female who unfortunately had a ground level fall with immediate left hip pain and inability to stand.  She was brought to the ER by EMS where radiographic evaluation revealed a left femoral neck fracture.  I reviewed treatment necessity and options.  She is familiar with arthroplasty and thus I reviewed the differences in the anticipated recovery from total knee to total hip replacement.  Consent obtained for management of her left hip fracture.     PROCEDURE IN DETAIL:  The patient was brought to operative theater.   Once adequate anesthesia, preoperative antibiotics, 1 gm of Vancomycin, 1 gm of Tranexamic Acid, and 10 mg of Decadron were administered, the patient was positioned supine on the [employer] table.  Once the patient was safely positioned with adequate padding of boney prominences we predraped out the hip, and used fluoroscopy to confirm orientation of the pelvis.      The left hip was then prepped and draped from proximal iliac crest to   mid thigh with a shower curtain  technique.      Time-out was performed identifying the patient, planned procedure, and the appropriate extremity.     An incision was then made 2 cm lateral to the   anterior superior iliac spine extending over the orientation of the   tensor fascia lata muscle and sharp dissection was carried down to the   fascia of the muscle.      The fascia was then incised.  The muscle belly was identified and swept   laterally and retractor placed along the superior neck.  Following   cauterization of the circumflex vessels and removing some pericapsular   fat, a second cobra retractor was placed on the inferior neck.  A T-capsulotomy was made along the line of the   superior neck to the trochanteric fossa, then extended proximally and   distally.  Tag sutures were placed and the retractors were then placed   intracapsular.  We then identified the trochanteric fossa and   orientation of my  neck cut and then made a neck osteotomy with the femur on traction.  The fracture femoral neck segment and her femoral   head was removed without difficulty or complication.  Traction was let   off and retractors were placed posterior and anterior around the   acetabulum.      The labrum and foveal tissue were debrided.  I began reaming with a 45 mm   reamer and reamed up to 49 mm reamer with good bony bed preparation and a 50 mm  cup was chosen.  The final 50 mm Pinnacle cup was then impacted under fluoroscopy to confirm the depth of penetration and orientation with respect to   Abduction and forward flexion.  A screw was placed into the ilium followed by the hole eliminator.  The final   32+4 neutral Altrex liner was impacted with good visualized rim fit.  The cup was positioned anatomically within the acetabular portion of the pelvis.      At this point, the femur was rolled to 100 degrees.  Further capsule was   released off the inferior aspect of the femoral neck.  I then   released the superior capsule  proximally.  With the leg in a neutral position the hook was placed laterally   along the femur under the vastus lateralis origin and elevated manually and then held in position using the hook attachment on the bed.  The leg was then extended and adducted with the leg rolled to 100   degrees of external rotation.  Retractors were placed along the medial calcar and posteriorly over the greater trochanter.  Once the proximal femur was fully   exposed, I used a box osteotome to set orientation.  I then began   broaching with the starting chili pepper broach and passed this by hand and then broached up to 5.  With the 5 broach in place I chose a high offset neck and did several trial reductions.  The offset was appropriate, leg lengths   appeared to be equal best matched with the +1 head ball trial confirmed radiographically.   Given these findings, I went ahead and dislocated the hip, repositioned all   retractors and positioned the right hip in the extended and abducted position.  The final 5 Hi Actis stem was   chosen and it was impacted down to the level of neck cut.  Based on this   and the trial reductions, a final 32+1 Articuleze metal head ball was chosen and   impacted onto a clean and dry trunnion, and the hip was reduced.  The   hip had been irrigated throughout the case again at this point.  I did   reapproximate the superior capsular leaflet to the anterior leaflet   using #1 Vicryl.  The fascia of the   tensor fascia lata muscle was then reapproximated using #1 Vicryl and #0 Stratafix sutures.  The   remaining wound was closed with 2-0 Vicryl and running 4-0 Monocryl.   The hip was cleaned, dried, and dressed sterilely using Dermabond and   Aquacel dressing.  The patient was then brought   to recovery room in stable condition tolerating the procedure well.    Costella Hatcher, PA-C was present for the entirety of the case involved from   preoperative positioning, perioperative retractor  management, general   facilitation of the case, as well as primary wound closure as assistant.            Rodman Key  Marian Sorrow, M.D.        11/09/2022 9:30 AM

## 2022-11-10 ENCOUNTER — Encounter (HOSPITAL_COMMUNITY): Payer: Self-pay | Admitting: Orthopedic Surgery

## 2022-11-10 DIAGNOSIS — S72002A Fracture of unspecified part of neck of left femur, initial encounter for closed fracture: Secondary | ICD-10-CM | POA: Diagnosis not present

## 2022-11-10 LAB — CBC
HCT: 27.6 % — ABNORMAL LOW (ref 36.0–46.0)
Hemoglobin: 8.7 g/dL — ABNORMAL LOW (ref 12.0–15.0)
MCH: 31.3 pg (ref 26.0–34.0)
MCHC: 31.5 g/dL (ref 30.0–36.0)
MCV: 99.3 fL (ref 80.0–100.0)
Platelets: 174 10*3/uL (ref 150–400)
RBC: 2.78 MIL/uL — ABNORMAL LOW (ref 3.87–5.11)
RDW: 14.5 % (ref 11.5–15.5)
WBC: 13.2 10*3/uL — ABNORMAL HIGH (ref 4.0–10.5)
nRBC: 0 % (ref 0.0–0.2)

## 2022-11-10 LAB — BASIC METABOLIC PANEL WITH GFR
Anion gap: 5 (ref 5–15)
BUN: 23 mg/dL (ref 8–23)
CO2: 25 mmol/L (ref 22–32)
Calcium: 9.1 mg/dL (ref 8.9–10.3)
Chloride: 103 mmol/L (ref 98–111)
Creatinine, Ser: 0.82 mg/dL (ref 0.44–1.00)
GFR, Estimated: >60 mL/min (ref >60)
Glucose, Bld: 141 mg/dL — ABNORMAL HIGH (ref 70–99)
Potassium: 3.8 mmol/L (ref 3.5–5.1)
Sodium: 133 mmol/L — ABNORMAL LOW (ref 135–145)

## 2022-11-10 LAB — MAGNESIUM: Magnesium: 2.3 mg/dL (ref 1.7–2.4)

## 2022-11-10 MED ORDER — MELATONIN 3 MG PO TABS
3.0000 mg | ORAL_TABLET | Freq: Every day | ORAL | Status: DC
  Administered 2022-11-10: 3 mg via ORAL
  Filled 2022-11-10: qty 1

## 2022-11-10 NOTE — Progress Notes (Signed)
Patient ID: Kathryn Ayala, female   DOB: 01/01/1935, 88 y.o.   MRN: [id number] Subjective: 1 Day Post-Op Procedure(s) (LRB): TOTAL HIP ARTHROPLASTY ANTERIOR APPROACH (Left)    Patient reports pain as moderate.  Hasn't been up since surgery yesterday.  Not much in bed movement. Daughter in room with her this morning  Objective:   VITALS:   Vitals:   11/10/22 0155 11/10/22 0544  BP: 108/62 118/63  Pulse: 99 85  Resp: 17 17  Temp: 98.5 F (36.9 C) 98.6 F (37 C)  SpO2: 98% 98%    Neurovascular intact Incision: dressing C/D/I  LABS Recent Labs    11/07/22 2000 11/08/22 0414 11/09/22 0438  HGB 14.1 11.0* 12.5  HCT 45.1 34.6* 39.1  WBC 5.7 6.6 7.3  PLT 229 186 167    Recent Labs    11/08/22 0414 11/09/22 0438 11/10/22 0347  NA 135 136 133*  K 3.5 4.4 3.8  BUN '21 17 23  '$ CREATININE 0.72 0.71 0.82  GLUCOSE 124* 121* 141*    Recent Labs    11/07/22 2000  INR 1.2     Assessment/Plan: 1 Day Post-Op Procedure(s) (LRB): TOTAL HIP ARTHROPLASTY ANTERIOR APPROACH (Left)   Up with therapy She has history of recent staged bilateral TKRs Therapy to focus both on left hip and her knees while here She went home after her left TKR in December thus we anticipate that to be her disposition  Resume Eliquis

## 2022-11-10 NOTE — Plan of Care (Signed)
  Problem: Coping: Goal: Level of anxiety will decrease Outcome: Progressing   Problem: Pain Managment: Goal: General experience of comfort will improve Outcome: Progressing

## 2022-11-10 NOTE — Plan of Care (Signed)
  Problem: Pain Managment: Goal: General experience of comfort will improve 11/10/2022 0517 by Blase Mess, RN Outcome: Progressing 11/10/2022 0516 by Blase Mess, RN Outcome: Progressing   Problem: Coping: Goal: Level of anxiety will decrease 11/10/2022 0517 by Blase Mess, RN Outcome: Progressing 11/10/2022 0516 by Blase Mess, RN Outcome: Progressing

## 2022-11-10 NOTE — TOC Initial Note (Signed)
Transition of Care [facility]) - Initial/Assessment Note   Patient Details  Name: Kathryn Ayala MRN: [id number] Date of Birth: 12-Mar-1935  [facility] CM/SW Contact:    Sherie Don, LCSW Phone Number: 11/10/2022, 2:39 PM  Clinical Narrative: PT evaluation recommended HHPT. CSW met with patient to discuss recommendations. Patient reported she would prefer to reach out to her PCP's office to have OPPT set up after discharge rather than having TOC set up HHPT. However, patient is agreeable to CSW following back up before discharge to set up HHPT if PCP's office is unable to set up OPPT. TOC to follow.  Expected Discharge Plan: [place] Barriers to Discharge: Continued Medical Work up  Patient Goals and CMS Choice Patient states their goals for this hospitalization and ongoing recovery are:: Discharge home with OPPT if it can be set up by PCP's office  Expected Discharge Plan and Services In-house Referral: Clinical Social Work Post Acute Care Choice: [place] arrangements for the past 2 months: [place]             DME Arranged: N/A DME Agency: NA  Prior Living Arrangements/Services Living arrangements for the past 2 months: [place] Do you feel safe going back to the place where you live?: Yes      Need for Family Participation in Patient Care: No (Comment) Care giver support system in place?: Yes (comment) Criminal Activity/Legal Involvement Pertinent to Current Situation/Hospitalization: No - Comment as needed  Activities of Daily Living Home Assistive Devices/Equipment: Environmental consultant (specify type), Cane (specify quad or straight) ADL Screening (condition at time of admission) Patient's cognitive ability adequate to safely complete daily activities?: Yes Is the patient deaf or have difficulty hearing?: No Does the patient have difficulty seeing, even when wearing glasses/contacts?: No Does the patient have difficulty  concentrating, remembering, or making decisions?: No Patient able to express need for assistance with ADLs?: Yes Does the patient have difficulty dressing or bathing?: No Independently performs ADLs?: Yes (appropriate for developmental age) Does the patient have difficulty walking or climbing stairs?: Yes Weakness of Legs: None Weakness of Arms/Hands: None  Emotional Assessment Appearance:: Appears stated age Attitude/Demeanor/Rapport: Engaged Affect (typically observed): Accepting, Appropriate Orientation: : Oriented to Self, Oriented to Place, Oriented to  Time, Oriented to Situation Alcohol / Substance Use: Not Applicable Psych Involvement: No (comment)  Admission diagnosis:  Closed left hip fracture ([place]) [S72.002A] Fall, initial encounter [W19.XXXA] Closed displaced fracture of left femoral neck ([place]) [S72.002A] Patient Active Problem List   Diagnosis Date Noted   S/P total left hip arthroplasty 11/09/2022   Closed left hip fracture ([place]) 11/07/2022   Osteoarthritis of left knee 09/22/2022   Osteoarthritis of right knee 04/07/2022   Cellulitis of right lower limb 02/06/2022   Chronic kidney disease, stage 3a ([place]) 02/06/2022   Dizzy 02/06/2022   Peripheral venous insufficiency 02/06/2022   Varicose veins of bilateral lower extremities with other complications 96/78/9381   Peripheral vascular disease ([place]) 01/30/2022   Thrombophilia ([place]) 12/15/2019   Vitamin D deficiency 08/25/2019   Primary osteoarthritis of both knees 08/16/2019   Acute otitis externa of left ear 03/08/2019   Sensorineural hearing loss (SNHL) of right ear with unrestricted hearing of left ear 03/08/2019   Current chronic use of systemic steroids 02/11/2019   Muscle pain 12/24/2018   Unilateral primary osteoarthritis, right knee 04/06/2018   Dermatitis 12/29/2017   Impacted cerumen 09/24/2017   Asymptomatic microscopic hematuria 06/24/2017  Trigger finger, right middle finger 02/16/2017   Acute pain  of right knee 02/16/2017   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[place]) 05/26/2016   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/12/2016   Chronic kidney disease due to hypertension 02/22/2015   Disorder of bone 06/21/2013   Gout 04/26/2012   Hyperlipidemia 04/26/2012   Proteinuria 04/26/2012   Paroxysmal atrial fibrillation ([place]) 12/08/2011   Underimmunization status 08/01/2010   Hypokalemia 01/07/2010   Impaired fasting glucose 01/07/2010   Allergic rhinitis 12/11/2009   Anemia 12/11/2009   Anxiety disorder 12/11/2009   Diaphragmatic hernia 12/11/2009   Primary hypertension 12/11/2009   Fibromyalgia 12/11/2009   PCP:  Crist Infante, MD Pharmacy:   [id number] [address] [place]Phone: [phone]: [phone] [id number] [address]hone: [phone]: [phone] Determinants of Health (SDOH) Social History: SDOH Screenings   Food Insecurity: No Food Insecurity (09/22/2022)  Housing: Low Risk  (09/22/2022)  Transportation Needs: No Transportation Needs (09/22/2022)  Utilities: Not At Risk (09/22/2022)  Tobacco Use: Low Risk  (11/09/2022)   SDOH Interventions:    Readmission Risk Interventions     No data to display

## 2022-11-10 NOTE — Plan of Care (Signed)
Problem: Education: Goal: Knowledge of General Education information will improve Description: Including pain rating scale, medication(s)/side effects and non-pharmacologic comfort measures Outcome: Progressing   Problem: Clinical Measurements: Goal: Ability to maintain clinical measurements within normal limits will improve Outcome: Progressing   Problem: Activity: Goal: Risk for activity intolerance will decrease Outcome: Progressing   Problem: Pain Managment: Goal: General experience of comfort will improve Outcome: Progressing   Ivan Anchors, RN 11/10/22 10:15 AM

## 2022-11-10 NOTE — Evaluation (Signed)
Physical Therapy Evaluation Patient Details Name: Kathryn Ayala MRN: [id number] DOB: [birth year] Today's Date: 11/10/2022  History of Present Illness  88 yo female Presented to hospital after mechanical fall. Found to have left femoral neck fracture. Orthopedic consulted. pt is now s/p L THA, AA on 11/10/22 - Dr Alvan Dame. PMH: HTN, HLD, CKD 3A, breast cancer, PAF on Eliquis, pulmonary HTN, fibromyalgia, PVD, TKA Dec 2023  Clinical Impression  Pt is s/p THA resulting in the deficits listed below (see PT Problem List).  Pt known to me from previous admissions. Eval focused on LE exercises  and progressing mobility. Plan is to return home per pt and dtr. Will need HHPT at d/c  Pt will benefit from skilled PT to increase their independence and safety with mobility to allow discharge to the venue listed below.         Recommendations for follow up therapy are one component of a multi-disciplinary discharge planning process, led by the attending physician.  Recommendations may be updated based on patient status, additional functional criteria and insurance authorization.  Follow Up Recommendations Home health PT      Assistance Recommended at Discharge Frequent or constant Supervision/Assistance  Patient can return home with the following  A little help with walking and/or transfers;A little help with bathing/dressing/bathroom;Assistance with cooking/housework;Assist for transportation;Help with stairs or ramp for entrance    Equipment Recommendations None recommended by PT  Recommendations for Other Services       Functional Status Assessment Patient has had a recent decline in their functional status and demonstrates the ability to make significant improvements in function in a reasonable and predictable amount of time.     Precautions / Restrictions Precautions Precautions: Fall Precaution Comments: recent L TKA, discussed continuing no pillow under knee Restrictions Weight Bearing  Restrictions: No LLE Weight Bearing: Weight bearing as tolerated Other Position/Activity Restrictions: WBAT      Mobility  Bed Mobility Overal bed mobility: Needs Assistance Bed Mobility: Supine to Sit     Supine to sit: Min assist     General bed mobility comments: incr time, assist to elevate trunk, progress LLE and bed pad utilized to complete scooting    Transfers Overall transfer level: Needs assistance Equipment used: Rolling walker (2 wheels) Transfers: Sit to/from Stand Sit to Stand: Min assist           General transfer comment: Cues for safety, hand/LE placement. Increased time, light assist to rise and steady with tranisition to RW    Ambulation/Gait Ambulation/Gait assistance: Min assist Gait Distance (Feet): 40 Feet Assistive device: Rolling walker (2 wheels) Gait Pattern/deviations: Step-to pattern, Trunk flexed       General Gait Details: Cues for safety, sequencing. Min assist  for balance and  safety. LOB x2 with min assist to recover  Stairs            Wheelchair Mobility    Modified Rankin (Stroke Patients Only)       Balance Overall balance assessment: Needs assistance Sitting-balance support: Feet supported, No upper extremity supported Sitting balance-Leahy Scale: Good     Standing balance support: Reliant on assistive device for balance, During functional activity, Bilateral upper extremity supported Standing balance-Leahy Scale: Poor Standing balance comment: reliant on device and external assist                             Pertinent Vitals/Pain Pain Assessment Pain Assessment: 0-10 Pain Score: 4  Pain  Location: left hip Pain Descriptors / Indicators: Aching, Sore Pain Intervention(s): Limited activity within patient's tolerance, Monitored during session, Premedicated before session, Repositioned, Ice applied    Home Living Family/patient expects to be discharged to:: Private residence Living Arrangements:  Other relatives (grand-dtr) Available Help at Discharge: Family;Available 24 hours/day Type of Home: House Home Access:  (threshold)       Home Layout: One level Home Equipment: Grab bars - tub/shower;Grab bars - toilet;Rolling Walker (2 wheels);Shower seat;Hand held shower head;BSC/3in1 Additional Comments: granddaugher lives with her, daughter Langley Gauss is an Therapist, sports    Prior Function Prior Level of Function : Independent/Modified Independent             Mobility Comments: just recently stopped using device since last TKA in Dec; had 3 OPPT visits left       Hand Dominance        Extremity/Trunk Assessment   Upper Extremity Assessment Upper Extremity Assessment: Overall WFL for tasks assessed    Lower Extremity Assessment RLE Deficits / Details: MMT ank DF/PF 4/5; knee and hip at least 3+/5 LLE Deficits / Details: limited by post op pain and weakness. hip flexion 2+/5; ankle WFL       Communication   Communication: No difficulties  Cognition Arousal/Alertness: Awake/alert Behavior During Therapy: WFL for tasks assessed/performed Overall Cognitive Status: Within Functional Limits for tasks assessed                                          General Comments      Exercises Total Joint Exercises Ankle Circles/Pumps: AROM, Both, 10 reps Quad Sets: AROM, Both, 5 reps Heel Slides:  (has been performing heel slides in bed on her own)   Assessment/Plan    PT Assessment Patient needs continued PT services  PT Problem List Decreased strength;Decreased range of motion;Decreased activity tolerance;Decreased balance;Decreased mobility;Pain       PT Treatment Interventions DME instruction;Gait training;Stair training;Functional mobility training;Therapeutic activities;Therapeutic exercise;Balance training;Neuromuscular re-education;Patient/family education    PT Goals (Current goals can be found in the Care Plan section)  Acute Rehab PT Goals Patient  Stated Goal: to go home PT Goal Formulation: With patient Time For Goal Achievement: 09/29/22 Potential to Achieve Goals: Good    Frequency 7X/week     Co-evaluation               AM-PAC PT "6 Clicks" Mobility  Outcome Measure Help needed turning from your back to your side while in a flat bed without using bedrails?: A Little Help needed moving from lying on your back to sitting on the side of a flat bed without using bedrails?: A Little Help needed moving to and from a bed to a chair (including a wheelchair)?: A Little Help needed standing up from a chair using your arms (e.g., wheelchair or bedside chair)?: A Little Help needed to walk in hospital room?: A Little Help needed climbing 3-5 steps with a railing? : A Lot 6 Click Score: 17    End of Session Equipment Utilized During Treatment: Gait belt Activity Tolerance: Patient tolerated treatment well Patient left: in chair;with call bell/phone within reach;with chair alarm set;with family/visitor present;with nursing/sitter in room Nurse Communication: Mobility status PT Visit Diagnosis: Pain;Difficulty in walking, not elsewhere classified (R26.2) Pain - Right/Left: Left Pain - part of body: Hip    Time: 4332-9518 PT Time Calculation (min) (ACUTE ONLY): 26  min   Charges:   PT Evaluation $PT Eval Low Complexity: 1 Low PT Treatments $Gait Training: 8-22 mins        Baxter Flattery, PT  Acute Rehab Dept [facility]) [phone]  [facility] Weekend Pager [facility] only)  [phone]  11/10/2022   [facility] 11/10/2022, 10:05 AM

## 2022-11-10 NOTE — Care Management Important Message (Signed)
Important Message  Patient Details IM Letter given. Name: Kathryn Ayala MRN: [id number] Date of Birth: Nov 29, 1934   Medicare Important Message Given:  Yes     Kerin Salen 11/10/2022, 3:58 PM

## 2022-11-10 NOTE — Progress Notes (Signed)
[facility] Progress Note Patient: Kathryn Ayala MCN:[id number] DOB: [birth year] DOA: 11/07/2022  DOS: the patient was seen and examined on 11/10/2022  Brief hospital course: PMH of HTN, HLD, CKD 3A, breast cancer, PAF on Eliquis, pulmonary HTN, fibromyalgia, PVD.  Presented to hospital after mechanical fall.  No head injury or neck injury.  Found to have left femoral neck fracture.  Orthopedic consulted.  Underwent left total hip replacement through anterior approach on 1/21.  Now monitor postop recovery. Assessment and Plan: Left femoral neck fracture, closed. After mechanical fall. Recently had 2 knee arthroplasties and tolerated them well. Does not appear to be at a high risk for surgery compared to those 2 surgeries which the patient has tolerated well. Continue pain control. Orthopedic consulted.  Underwent left hip total replacement through anterior approach on 1/21. Monitor for bleeding risk.  Resumption of Eliquis per orthopedics. Weightbearing and pain control also per orthopedics.  Confusion. Related to anesthesia.  And pain medication Monitor.  Acute postop blood loss anemia. Expected. Hemoglobin 12.5 before surgery.  8.7 right now. Monitor H&H for stability. On Eliquis.   Mechanical fall. PT OT consult. Likely associated with her neuropathy. Monitor.   Possible neuropathy.  Likely nondiabetic. Patient reports neuropathy-like symptoms. Normal vitamin O29 and folic acid level. PT OT consult.   Hypokalemia. Corrected.   Paroxysmal A-fib. On Eliquis.  Currently on hold.  Last dose 1/19 in the morning.  Resumption per orthopedics. Continue rate control medication.   CKD 18 CKD 88-year-old daughter.   Renal function stable. Monitor.   HTN. Blood pressure stable.   Anxiety. Resuming home regimen.  HLD. Continue statin.  History of gout. Continue allopurinol.  Insomnia. Will add melatonin.   Subjective: Somewhat confused.  No nausea no vomiting  no fever no chills.  Pain well-controlled.  Physical Exam: General: in Mild distress, No Rash Cardiovascular: S1 and S2 Present, No Murmur Respiratory: Good respiratory effort, Bilateral Air entry present. No Crackles, No wheezes Abdomen: Bowel Sound present, No tenderness Extremities: No edema Neuro: Alert and oriented x3, no new focal deficit, no asterixis  Data Reviewed: I have Reviewed nursing notes, Vitals, and Lab results. Since last encounter, pertinent lab results CBC and BMP   . I have ordered test including CBC and BMP  .   Disposition: Status is: Inpatient  SCDs Start: 11/09/22 1247 Place TED hose Start: 11/09/22 1247 apixaban (ELIQUIS) tablet 5 mg   Family Communication: Daughter is at bedside. Level of care: Med-Surg Vitals:   11/10/22 0155 11/10/22 0544 11/10/22 0912 11/10/22 1349  BP: 108/62 118/63 99/60 122/64  Pulse: 99 85 75 92  Resp: '17 17 18 18  '$ Temp: 98.5 F (36.9 C) 98.6 F (37 C) 98 F (36.7 C) 97.9 F (36.6 C)  TempSrc: Oral  Oral   SpO2: 98% 98% 98% 95%  Weight:      Height:         Author: Berle Mull, MD 11/10/2022 7:09 PM  Please look on [email or web address] to find out who is on call.

## 2022-11-11 DIAGNOSIS — S72002A Fracture of unspecified part of neck of left femur, initial encounter for closed fracture: Secondary | ICD-10-CM | POA: Diagnosis not present

## 2022-11-11 LAB — CBC
HCT: 24.5 % — ABNORMAL LOW (ref 36.0–46.0)
Hemoglobin: 8 g/dL — ABNORMAL LOW (ref 12.0–15.0)
MCH: 31.9 pg (ref 26.0–34.0)
MCHC: 32.7 g/dL (ref 30.0–36.0)
MCV: 97.6 fL (ref 80.0–100.0)
Platelets: 169 10*3/uL (ref 150–400)
RBC: 2.51 MIL/uL — ABNORMAL LOW (ref 3.87–5.11)
RDW: 14.4 % (ref 11.5–15.5)
WBC: 11 10*3/uL — ABNORMAL HIGH (ref 4.0–10.5)
nRBC: 0.3 % — ABNORMAL HIGH (ref 0.0–0.2)

## 2022-11-11 LAB — VITAMIN D 25 HYDROXY (VIT D DEFICIENCY, FRACTURES)

## 2022-11-11 LAB — BASIC METABOLIC PANEL
Anion gap: 7 (ref 5–15)
BUN: 36 mg/dL — ABNORMAL HIGH (ref 8–23)
CO2: 19 mmol/L — ABNORMAL LOW (ref 22–32)
Calcium: 8.9 mg/dL (ref 8.9–10.3)
Chloride: 104 mmol/L (ref 98–111)
Creatinine, Ser: 0.99 mg/dL (ref 0.44–1.00)
GFR, Estimated: 56 mL/min — ABNORMAL LOW (ref >60)
Glucose, Bld: 154 mg/dL — ABNORMAL HIGH (ref 70–99)
Potassium: 4.1 mmol/L (ref 3.5–5.1)
Sodium: 130 mmol/L — ABNORMAL LOW (ref 135–145)

## 2022-11-11 LAB — MAGNESIUM: Magnesium: 2 mg/dL (ref 1.7–2.4)

## 2022-11-11 MED ORDER — DOCUSATE SODIUM 100 MG PO CAPS: 100.0000 mg | ORAL_CAPSULE | Freq: Two times a day (BID) | ORAL | 0 refills | Status: DC

## 2022-11-11 MED ORDER — FERROUS SULFATE 325 (65 FE) MG PO TABS: 325.0000 mg | ORAL_TABLET | Freq: Every day | ORAL | 0 refills | Status: AC

## 2022-11-11 MED ORDER — METHOCARBAMOL 500 MG PO TABS: 500.0000 mg | ORAL_TABLET | Freq: Four times a day (QID) | ORAL | 1 refills | Status: DC | PRN

## 2022-11-11 MED ORDER — B COMPLEX-C PO TABS: 1.0000 | ORAL_TABLET | Freq: Every day | ORAL | 0 refills | Status: AC

## 2022-11-11 MED ORDER — ENSURE ENLIVE PO LIQD: 237.0000 mL | ORAL | 0 refills | Status: DC

## 2022-11-11 MED ORDER — TRAMADOL HCL 50 MG PO TABS: 50.0000 mg | ORAL_TABLET | Freq: Four times a day (QID) | ORAL | 0 refills | Status: DC | PRN

## 2022-11-11 NOTE — Progress Notes (Signed)
Patient ID: Kathryn Ayala, female   DOB: 1935/02/22, 88 y.o.   MRN: [id number] Subjective: 2 Days Post-Op Procedure(s) (LRB): TOTAL HIP ARTHROPLASTY ANTERIOR APPROACH (Left)    Patient reports pain as mild to moderate Up with therapy yesterday  Objective:   VITALS:   Vitals:   11/10/22 2127 11/11/22 0540  BP: 121/64 119/83  Pulse: 98 (!) 102  Resp: 17 17  Temp: 99.7 F (37.6 C) 97.9 F (36.6 C)  SpO2: 94% 92%    Neurovascular intact Incision: dressing C/D/I  LABS Recent Labs    11/09/22 0438 11/10/22 1036 11/11/22 0356  HGB 12.5 8.7* 8.0*  HCT 39.1 27.6* 24.5*  WBC 7.3 13.2* 11.0*  PLT 167 174 169    Recent Labs    11/09/22 0438 11/10/22 0347 11/11/22 0356  NA 136 133* 130*  K 4.4 3.8 4.1  BUN 17 23 36*  CREATININE 0.71 0.82 0.99  GLUCOSE 121* 141* 154*    No results for input(s): "LABPT", "INR" in the last 72 hours.   Assessment/Plan: 2 Days Post-Op Procedure(s) (LRB): TOTAL HIP ARTHROPLASTY ANTERIOR APPROACH (Left)   Up with therapy Will work on arranging therapy as out patient  No HHPT needed WBAT RCT in 2 weeks

## 2022-11-11 NOTE — Progress Notes (Signed)
Subjective: 2 Days Post-Op Procedure(s) (LRB): TOTAL HIP ARTHROPLASTY ANTERIOR APPROACH (Left) Patient reports pain as mild.   Patient seen in rounds with Dr. Alvan Dame. Patient is resting in bed on exam this morning. No acute events overnight. Family at the bedside this morning. Ambulated 40 feet with PT yesterday. We will continue therapy today.   Objective: Vital signs in last 24 hours: Temp:  [97.9 F (36.6 C)-99.7 F (37.6 C)] 97.9 F (36.6 C) (01/23 0540) Pulse Rate:  [92-102] 102 (01/23 0540) Resp:  [17-18] 17 (01/23 0540) BP: (119-122)/(64-83) 119/83 (01/23 0540) SpO2:  [92 %-95 %] 92 % (01/23 0540)  Intake/Output from previous day:  Intake/Output Summary (Last 24 hours) at 11/11/2022 1014 Last data filed at 11/11/2022 0900 Gross per 24 hour  Intake 600 ml  Output --  Net 600 ml     Intake/Output this shift: Total I/O In: 120 [P.O.:120] Out: -   Labs: Recent Labs    11/09/22 0438 11/10/22 1036 11/11/22 0356  HGB 12.5 8.7* 8.0*   Recent Labs    11/10/22 1036 11/11/22 0356  WBC 13.2* 11.0*  RBC 2.78* 2.51*  HCT 27.6* 24.5*  PLT 174 169   Recent Labs    11/10/22 0347 11/11/22 0356  NA 133* 130*  K 3.8 4.1  CL 103 104  CO2 25 19*  BUN 23 36*  CREATININE 0.82 0.99  GLUCOSE 141* 154*  CALCIUM 9.1 8.9   No results for input(s): "LABPT", "INR" in the last 72 hours.  Exam: General - Patient is Alert and Oriented Extremity - Neurologically intact Sensation intact distally Intact pulses distally Dorsiflexion/Plantar flexion intact Dressing - dressing C/D/I Motor Function - intact, moving foot and toes well on exam.   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   Weight as of this encounter: 64.4 kg. Advance diet Up with therapy  DVT Prophylaxis - Aspirin Weight bearing as tolerated.  ABLA: Hgb stable at 8 this AM.  Someone indicated to the patient that her PCP would prescribe HHPT. Dr. Alvan Dame discussed with patient and family that [place] will not be needed.  She is in [place] at [employer] in [place], which we will pause for the next few days to a week and then resume.  She is hopeful for discharge home today. Discharge per medicine when stable and meeting goals with PT.   Griffith Citron, PA-C Orthopedic  Surgery [phone] 11/11/2022, 10:14 AM

## 2022-11-11 NOTE — Plan of Care (Signed)

## 2022-11-11 NOTE — TOC Transition Note (Signed)
Transition of Care [facility]) - CM/SW Discharge Note  Patient Details  Name: Kathryn Ayala MRN: [id number] Date of Birth: 1935-03-26  [facility]) CM/SW Contact:  Sherie Don, LCSW Phone Number: 11/11/2022, 11:40 AM  Clinical Narrative: CSW met with patient and daughter and confirmed the plan is to discharge home with OPPT. Patient will resume PT at [facility] PT. TOC signing off.  Final next level of care: OP Rehab Barriers to Discharge: Barriers Resolved  Patient Goals and CMS Choice Choice offered to / list presented to : NA  Discharge Plan and Services Additional resources added to the After Visit Summary for   In-house Referral: Clinical Social Work Post Acute Care Choice: Home Health          DME Arranged: N/A DME Agency: NA  Social Determinants of Health (SDOH) Interventions SDOH Screenings   Food Insecurity: No Food Insecurity (09/22/2022)  Housing: Low Risk  (09/22/2022)  Transportation Needs: No Transportation Needs (09/22/2022)  Utilities: Not At Risk (09/22/2022)  Tobacco Use: Low Risk  (11/10/2022)   Readmission Risk Interventions     No data to display

## 2022-11-11 NOTE — Plan of Care (Signed)

## 2022-11-13 NOTE — Discharge Summary (Signed)
Physician Discharge Summary   Patient: Kathryn Ayala MRN: [id number] DOB: 12-09-1934  Admit date:     11/07/2022  Discharge date: 11/11/2022  Discharge Physician: Berle Mull  PCP: Crist Infante, MD  Recommendations at discharge: Follow-up with PCP in 1 week.  Check CBC and BMP in 1 week.   Follow-up Information     Paralee Cancel, MD. Schedule an appointment as soon as possible for a visit in 2 week(s).   Specialty: Orthopedic Surgery Contact information: [address] [place] [address] [phone]         Crist Infante, MD. Schedule an appointment as soon as possible for a visit.   Specialty: Internal Medicine Why: with CBC and BMP Contact information: [address] [place] [address] [phone]                Discharge Diagnoses: Principal Problem:   Closed left hip fracture [facility]) Active Problems:   Hypokalemia   Paroxysmal atrial fibrillation (HCC)   Chronic kidney disease, stage 3a ([place])   Primary hypertension   Hyperlipidemia   Gout   Anxiety disorder   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[place])   S/P total left hip arthroplasty  Hospital Course: PMH of HTN, HLD, CKD 3A, breast cancer, PAF on Eliquis, pulmonary HTN, fibromyalgia, PVD.  Presented to hospital after mechanical fall.  No head injury or neck injury.  Found to have left femoral neck fracture.  Orthopedic consulted.  Underwent left total hip replacement through anterior approach on 1/21.  Now monitor postop recovery. Assessment and Plan  Left femoral neck fracture, closed. After mechanical fall. Recently had 2 knee arthroplasties and tolerated them well. Does not appear to be at a high risk for surgery compared to those 2 surgeries which the patient has tolerated well. Continue pain control. Orthopedic consulted.  Underwent left hip total replacement through anterior approach on 1/21. Monitor for bleeding risk.  Resumption of Eliquis  per orthopedics. Weightbearing and pain control also per orthopedics.   Confusion. Related to anesthesia.  And pain medication.  No evidence of toxic encephalopathy. Monitor.   Acute postop blood loss anemia. Expected. Hemoglobin 12.5 before surgery.  8.7 right now. Monitor H&H for stability. On Eliquis.   Mechanical fall. PT OT consult. Likely associated with her neuropathy. Monitor.   Possible neuropathy.  Likely nondiabetic. Patient reports neuropathy-like symptoms. Normal vitamin J19 and folic acid level. PT OT consult.   Hypokalemia. Corrected.   Paroxysmal A-fib. On Eliquis.  Currently on hold.  Last dose 1/19 in the morning.  Resumption per orthopedics. Continue rate control medication.   CKD 34 CKD 18-year-old daughter.   Renal function stable. Monitor.   HTN. Blood pressure stable.   Anxiety. Resuming home regimen.   HLD. Continue statin.   History of gout. Continue allopurinol.  Pain control - [employer] Controlled Substance Reporting System database was reviewed. and patient was instructed, not to drive, operate heavy machinery, perform activities at heights, swimming or participation in water activities or provide baby-sitting services while on Pain, Sleep and Anxiety Medications; until their outpatient Physician has advised to do so again. Also recommended to not to take more than prescribed Pain, Sleep and Anxiety Medications.  Consultants:  Orthopedics  Procedures performed:  Left total hip replacement through an anterior approach   utilizing DePuy THR system, component size 50 mm pinnacle cup, a size 32+4 neutral   Altrex liner, a size 5Hi Actis stem with a 32+1 Articuleze metal  head ball.   DISCHARGE MEDICATION: Allergies as of 11/11/2022       Reactions   Penicillins Anaphylaxis, Swelling, Rash   Has patient had a PCN reaction causing immediate rash, facial/tongue/throat swelling, SOB or lightheadedness with hypotension: Yes Has patient  had a PCN reaction causing severe rash involving mucus membranes or skin necrosis: Yes Has patient had a PCN reaction that required hospitalization No Has patient had a PCN reaction occurring within the last 10 years: No If all of the above answers are "NO", then may proceed with Cephalosporin use.   Hydrocodone Other (See Comments)   Makes very confused   Azithromycin    PT unsure of reaction    Codeine Other (See Comments)   UNSPECIFIED REACTION Patient was told not to take it.   Levaquin [levofloxacin] Other (See Comments)   UNSPECIFIED REACTION Patient was told not to take it.   Sulfa Antibiotics Itching, Rash        Medication List     STOP taking these medications    triamterene-hydrochlorothiazide 37.5-25 MG tablet Commonly known as: MAXZIDE-25       TAKE these medications    acetaminophen 500 MG tablet Commonly known as: TYLENOL Take 1,000 mg by mouth every 6 (six) hours as needed for moderate pain.   allopurinol 100 MG tablet Commonly known as: ZYLOPRIM Take 100 mg by mouth 2 (two) times daily.   atenolol 50 MG tablet Commonly known as: TENORMIN Take 50 mg by mouth 2 (two) times daily.   B-complex with vitamin C tablet Take 1 tablet by mouth daily.   Biotin 5000 5 MG Caps Generic drug: Biotin Take 5 mg by mouth daily.   docusate sodium 100 MG capsule Commonly known as: COLACE Take 1 capsule (100 mg total) by mouth 2 (two) times daily.   Eliquis 5 MG Tabs tablet Generic drug: apixaban TAKE 1 TABLET BY MOUTH TWICE A DAY   feeding supplement Liqd Take 237 mLs by mouth daily.   ferrous sulfate 325 (65 FE) MG tablet Take 1 tablet (325 mg total) by mouth daily with breakfast.   loratadine 10 MG tablet Commonly known as: CLARITIN Take 10 mg by mouth daily.   methocarbamol 500 MG tablet Commonly known as: ROBAXIN Take 1 tablet (500 mg total) by mouth every 6 (six) hours as needed for muscle spasms.   rosuvastatin 5 MG tablet Commonly known  as: CRESTOR Take 5 mg by mouth daily.   traMADol 50 MG tablet Commonly known as: ULTRAM Take 1-2 tablets (50-100 mg total) by mouth every 6 (six) hours as needed for moderate pain. What changed: reasons to take this   venlafaxine XR 37.5 MG 24 hr capsule Commonly known as: EFFEXOR-XR Take 37.5 mg by mouth daily.   vitamin B-12 500 MCG tablet Commonly known as: CYANOCOBALAMIN Take 500 mcg by mouth daily.   vitamin D3 50 MCG (2000 UT) Caps Take 2,000 Units by mouth daily.   vitamin E 180 MG (400 UNITS) capsule Take 400 Units by mouth daily.       Disposition: Home Diet recommendation: Regular diet  Discharge Exam: Vitals:   11/10/22 1349 11/10/22 2127 11/11/22 0540 11/11/22 1308  BP: 122/64 121/64 119/83 106/73  Pulse: 92 98 (!) 102 98  Resp: '18 17 17 18  '$ Temp: 97.9 F (36.6 C) 99.7 F (37.6 C) 97.9 F (36.6 C) 98.4 F (36.9 C)  TempSrc:  Oral Oral   SpO2: 95% 94% 92% 98%  Weight:  Height:       General: Appear in no distress; no visible Abnormal Neck Mass Or lumps, Conjunctiva normal Cardiovascular: S1 and S2 Present, no Murmur, Respiratory: good respiratory effort, Bilateral Air entry present and CTA, no Crackles, no wheezes Abdomen: Bowel Sound present, Non tender  Extremities: no Pedal edema Neurology: alert and oriented to place and person  Filed Weights   11/08/22 1724  Weight: 64.4 kg   Condition at discharge: stable  The results of significant diagnostics from this hospitalization (including imaging, microbiology, ancillary and laboratory) are listed below for reference.   Imaging Studies: DG Pelvis Portable  Result Date: 11/09/2022 CLINICAL DATA:  Status post total left hip arthroplasty. EXAM: PORTABLE PELVIS 1-2 VIEWS COMPARISON:  Pelvis and left hip radiographs 11/06/2022 FINDINGS: Interval total left hip arthroplasty.  No perihardware Lucency is seen to indicate hardware failure or loosening. Expected postoperative lateral subcutaneous air.  Mild bilateral sacroiliac subchondral sclerosis. Mild pubic symphysis peripheral degenerative osteophytosis. Mild superior femoroacetabular joint space narrowing. Vascular phleboliths overlie the pelvis. IMPRESSION: Interval total left hip arthroplasty without evidence of hardware failure. Electronically Signed   By: Yvonne Kendall M.D.   On: 11/09/2022 12:33   DG HIP UNILAT WITH PELVIS 1V LEFT  Result Date: 11/09/2022 CLINICAL DATA:  Left hip replacement EXAM: DG HIP (WITH OR WITHOUT PELVIS) 1V*L* COMPARISON:  None Available. FINDINGS: Multiple intraoperative fluoroscopic spot images are provided. Interval left total hip arthroplasty. Normal alignment. FLUOROSCOPY TIME:  Radiation Exposure Index: 2.31 mGy IMPRESSION: 1. Interval left total hip arthroplasty. Electronically Signed   By: Kathreen Devoid M.D.   On: 11/09/2022 10:55   DG C-Arm 1-60 Min-No Report  Result Date: 11/09/2022 Fluoroscopy was utilized by the requesting physician.  No radiographic interpretation.   DG Hip Unilat W or Wo Pelvis 2-3 Views Left  Result Date: 11/07/2022 CLINICAL DATA:  Fall, concern for left hip fracture. EXAM: DG HIP (WITH OR WITHOUT PELVIS) 2-3V LEFT COMPARISON:  None Available. FINDINGS: There is a suspected fracture of the left femoral neck with overlapping. No dislocation. The remaining bony structures are intact. Degenerative changes are present in the lumbar spine. IMPRESSION: Suspected left femoral neck fracture. Electronically Signed   By: Brett Fairy M.D.   On: 11/07/2022 20:42   DG Knee 2 Views Left  Result Date: 11/07/2022 CLINICAL DATA:  Fall, left hip fracture. EXAM: LEFT KNEE - 1-2 VIEW COMPARISON:  None Available. FINDINGS: Total knee arthroplasty changes are noted. There is no evidence of fracture, dislocation, or hardware loosening. No joint effusion. The soft tissues are within normal limits. IMPRESSION: Status post total knee arthroplasty with no evidence of acute fracture or hardware loosening.  Electronically Signed   By: Brett Fairy M.D.   On: 11/07/2022 20:39   DG Chest Portable 1 View  Result Date: 11/07/2022 CLINICAL DATA:  Left hip fracture.  Fall EXAM: PORTABLE CHEST 1 VIEW COMPARISON:  Rib series 02/06/2005 FINDINGS: Enlarged cardiopericardial silhouette. Tortuous and ectatic aorta. No consolidation, pneumothorax or effusion. Surgical clips overlie the left hemithorax. IMPRESSION: Enlarged cardiopericardial silhouette. No pneumothorax or consolidation. Electronically Signed   By: Jill Side M.D.   On: 11/07/2022 20:36    Microbiology: Results for orders placed or performed during the hospital encounter of 11/07/22  Surgical pcr screen     Status: None   Collection Time: 11/08/22  6:02 PM   Specimen: Nasal Mucosa; Nasal Swab  Result Value Ref Range Status   MRSA, PCR NEGATIVE NEGATIVE Final   Staphylococcus aureus NEGATIVE  NEGATIVE Final    Comment: (NOTE) The Xpert SA Assay (FDA approved for NASAL specimens in patients 86 years of age and older), is one component of a comprehensive surveillance program. It is not intended to diagnose infection nor to guide or monitor treatment. Performed at [facility], [place] [address], [place], [place] [address]    Labs: CBC: Recent Labs  Lab 11/07/22 2000 11/08/22 0414 11/09/22 0438 11/10/22 1036 11/11/22 0356  WBC 5.7 6.6 7.3 13.2* 11.0*  NEUTROABS 4.4  --   --   --   --   HGB 14.1 11.0* 12.5 8.7* 8.0*  HCT 45.1 34.6* 39.1 27.6* 24.5*  MCV 100.2* 99.1 99.2 99.3 97.6  PLT 229 186 167 174 294   Basic Metabolic Panel: Recent Labs  Lab 11/07/22 2000 11/08/22 0414 11/09/22 0438 11/10/22 0347 11/11/22 0356  NA 135 135 136 133* 130*  K 3.4* 3.5 4.4 3.8 4.1  CL 101 103 102 103 104  CO2 '23 25 26 25 '$ 19*  GLUCOSE 100* 124* 121* 141* 154*  BUN 31* '21 17 23 '$ 36*  CREATININE 0.96 0.72 0.71 0.82 0.99  CALCIUM 10.3 9.5 9.6 9.1 8.9  MG 2.0  --  1.5* 2.3 2.0   Liver Function Tests: No results for  input(s): "AST", "ALT", "ALKPHOS", "BILITOT", "PROT", "ALBUMIN" in the last 168 hours. CBG: No results for input(s): "GLUCAP" in the last 168 hours.  Discharge time spent: greater than 30 minutes.  Signed: Berle Mull, MD [facility] 11/11/2022

## 2022-11-17 DIAGNOSIS — R2689 Other abnormalities of gait and mobility: Secondary | ICD-10-CM | POA: Diagnosis not present

## 2022-11-17 DIAGNOSIS — Z96642 Presence of left artificial hip joint: Secondary | ICD-10-CM | POA: Diagnosis not present

## 2022-11-20 DIAGNOSIS — Z96642 Presence of left artificial hip joint: Secondary | ICD-10-CM | POA: Diagnosis not present

## 2022-11-20 DIAGNOSIS — R2689 Other abnormalities of gait and mobility: Secondary | ICD-10-CM | POA: Diagnosis not present

## 2022-11-25 DIAGNOSIS — R2689 Other abnormalities of gait and mobility: Secondary | ICD-10-CM | POA: Diagnosis not present

## 2022-11-25 DIAGNOSIS — Z96642 Presence of left artificial hip joint: Secondary | ICD-10-CM | POA: Diagnosis not present

## 2022-11-26 DIAGNOSIS — M7541 Impingement syndrome of right shoulder: Secondary | ICD-10-CM | POA: Diagnosis not present

## 2022-11-27 DIAGNOSIS — R2689 Other abnormalities of gait and mobility: Secondary | ICD-10-CM | POA: Diagnosis not present

## 2022-11-27 DIAGNOSIS — Z96642 Presence of left artificial hip joint: Secondary | ICD-10-CM | POA: Diagnosis not present

## 2022-12-02 DIAGNOSIS — Z96642 Presence of left artificial hip joint: Secondary | ICD-10-CM | POA: Diagnosis not present

## 2022-12-02 DIAGNOSIS — R2689 Other abnormalities of gait and mobility: Secondary | ICD-10-CM | POA: Diagnosis not present

## 2022-12-05 DIAGNOSIS — Z96642 Presence of left artificial hip joint: Secondary | ICD-10-CM | POA: Diagnosis not present

## 2022-12-05 DIAGNOSIS — R2689 Other abnormalities of gait and mobility: Secondary | ICD-10-CM | POA: Diagnosis not present

## 2022-12-08 DIAGNOSIS — R2689 Other abnormalities of gait and mobility: Secondary | ICD-10-CM | POA: Diagnosis not present

## 2022-12-08 DIAGNOSIS — Z96642 Presence of left artificial hip joint: Secondary | ICD-10-CM | POA: Diagnosis not present

## 2022-12-11 DIAGNOSIS — Z96642 Presence of left artificial hip joint: Secondary | ICD-10-CM | POA: Diagnosis not present

## 2022-12-11 DIAGNOSIS — R2689 Other abnormalities of gait and mobility: Secondary | ICD-10-CM | POA: Diagnosis not present

## 2022-12-15 DIAGNOSIS — Z96642 Presence of left artificial hip joint: Secondary | ICD-10-CM | POA: Diagnosis not present

## 2022-12-15 DIAGNOSIS — R2689 Other abnormalities of gait and mobility: Secondary | ICD-10-CM | POA: Diagnosis not present

## 2022-12-16 DIAGNOSIS — N390 Urinary tract infection, site not specified: Secondary | ICD-10-CM | POA: Diagnosis not present

## 2022-12-16 DIAGNOSIS — I480 Paroxysmal atrial fibrillation: Secondary | ICD-10-CM | POA: Diagnosis not present

## 2022-12-16 DIAGNOSIS — R42 Dizziness and giddiness: Secondary | ICD-10-CM | POA: Diagnosis not present

## 2022-12-16 DIAGNOSIS — I739 Peripheral vascular disease, unspecified: Secondary | ICD-10-CM | POA: Diagnosis not present

## 2022-12-16 DIAGNOSIS — D649 Anemia, unspecified: Secondary | ICD-10-CM | POA: Diagnosis not present

## 2022-12-16 DIAGNOSIS — R351 Nocturia: Secondary | ICD-10-CM | POA: Diagnosis not present

## 2022-12-16 DIAGNOSIS — I10 Essential (primary) hypertension: Secondary | ICD-10-CM | POA: Diagnosis not present

## 2022-12-18 DIAGNOSIS — Z96642 Presence of left artificial hip joint: Secondary | ICD-10-CM | POA: Diagnosis not present

## 2022-12-18 DIAGNOSIS — R2689 Other abnormalities of gait and mobility: Secondary | ICD-10-CM | POA: Diagnosis not present

## 2022-12-22 DIAGNOSIS — R2689 Other abnormalities of gait and mobility: Secondary | ICD-10-CM | POA: Diagnosis not present

## 2022-12-22 DIAGNOSIS — Z96642 Presence of left artificial hip joint: Secondary | ICD-10-CM | POA: Diagnosis not present

## 2022-12-24 DIAGNOSIS — M8000XA Age-related osteoporosis with current pathological fracture, unspecified site, initial encounter for fracture: Secondary | ICD-10-CM | POA: Diagnosis not present

## 2022-12-24 DIAGNOSIS — I129 Hypertensive chronic kidney disease with stage 1 through stage 4 chronic kidney disease, or unspecified chronic kidney disease: Secondary | ICD-10-CM | POA: Diagnosis not present

## 2022-12-24 DIAGNOSIS — E785 Hyperlipidemia, unspecified: Secondary | ICD-10-CM | POA: Diagnosis not present

## 2022-12-24 DIAGNOSIS — I10 Essential (primary) hypertension: Secondary | ICD-10-CM | POA: Diagnosis not present

## 2022-12-24 DIAGNOSIS — M8580 Other specified disorders of bone density and structure, unspecified site: Secondary | ICD-10-CM | POA: Diagnosis not present

## 2022-12-24 DIAGNOSIS — Z0000 Encounter for general adult medical examination without abnormal findings: Secondary | ICD-10-CM | POA: Diagnosis not present

## 2022-12-24 DIAGNOSIS — R82998 Other abnormal findings in urine: Secondary | ICD-10-CM | POA: Diagnosis not present

## 2022-12-24 DIAGNOSIS — E559 Vitamin D deficiency, unspecified: Secondary | ICD-10-CM | POA: Diagnosis not present

## 2022-12-24 DIAGNOSIS — Z23 Encounter for immunization: Secondary | ICD-10-CM | POA: Diagnosis not present

## 2022-12-24 DIAGNOSIS — N1831 Chronic kidney disease, stage 3a: Secondary | ICD-10-CM | POA: Diagnosis not present

## 2022-12-24 DIAGNOSIS — M109 Gout, unspecified: Secondary | ICD-10-CM | POA: Diagnosis not present

## 2022-12-24 DIAGNOSIS — I480 Paroxysmal atrial fibrillation: Secondary | ICD-10-CM | POA: Diagnosis not present

## 2022-12-24 DIAGNOSIS — R7301 Impaired fasting glucose: Secondary | ICD-10-CM | POA: Diagnosis not present

## 2022-12-30 DIAGNOSIS — R2689 Other abnormalities of gait and mobility: Secondary | ICD-10-CM | POA: Diagnosis not present

## 2022-12-30 DIAGNOSIS — Z96642 Presence of left artificial hip joint: Secondary | ICD-10-CM | POA: Diagnosis not present

## 2022-12-31 DIAGNOSIS — Z471 Aftercare following joint replacement surgery: Secondary | ICD-10-CM | POA: Diagnosis not present

## 2022-12-31 DIAGNOSIS — Z96642 Presence of left artificial hip joint: Secondary | ICD-10-CM | POA: Diagnosis not present

## 2022-12-31 DIAGNOSIS — M25512 Pain in left shoulder: Secondary | ICD-10-CM | POA: Diagnosis not present

## 2022-12-31 DIAGNOSIS — M25511 Pain in right shoulder: Secondary | ICD-10-CM | POA: Diagnosis not present

## 2023-01-02 DIAGNOSIS — R2689 Other abnormalities of gait and mobility: Secondary | ICD-10-CM | POA: Diagnosis not present

## 2023-01-02 DIAGNOSIS — Z96642 Presence of left artificial hip joint: Secondary | ICD-10-CM | POA: Diagnosis not present

## 2023-01-04 ENCOUNTER — Emergency Department (HOSPITAL_BASED_OUTPATIENT_CLINIC_OR_DEPARTMENT_OTHER)
Admission: EM | Admit: 2023-01-04 | Discharge: 2023-01-04 | Disposition: A | Discharge status: Home / Self Care | Payer: Medicare PPO | Attending: Emergency Medicine | Admitting: Emergency Medicine

## 2023-01-04 ENCOUNTER — Emergency Department (HOSPITAL_BASED_OUTPATIENT_CLINIC_OR_DEPARTMENT_OTHER): Payer: Medicare PPO

## 2023-01-04 ENCOUNTER — Other Ambulatory Visit: Payer: Self-pay

## 2023-01-04 ENCOUNTER — Encounter (HOSPITAL_BASED_OUTPATIENT_CLINIC_OR_DEPARTMENT_OTHER): Payer: Self-pay

## 2023-01-04 DIAGNOSIS — I10 Essential (primary) hypertension: Secondary | ICD-10-CM | POA: Diagnosis not present

## 2023-01-04 DIAGNOSIS — Z79899 Other long term (current) drug therapy: Secondary | ICD-10-CM | POA: Insufficient documentation

## 2023-01-04 DIAGNOSIS — M25519 Pain in unspecified shoulder: Secondary | ICD-10-CM

## 2023-01-04 DIAGNOSIS — R079 Chest pain, unspecified: Secondary | ICD-10-CM | POA: Diagnosis not present

## 2023-01-04 DIAGNOSIS — M25511 Pain in right shoulder: Secondary | ICD-10-CM | POA: Diagnosis not present

## 2023-01-04 DIAGNOSIS — M25512 Pain in left shoulder: Secondary | ICD-10-CM | POA: Insufficient documentation

## 2023-01-04 DIAGNOSIS — Z7901 Long term (current) use of anticoagulants: Secondary | ICD-10-CM | POA: Diagnosis not present

## 2023-01-04 LAB — CBC WITH DIFFERENTIAL/PLATELET
Abs Immature Granulocytes: 0.06 10*3/uL (ref 0.00–0.07)
Basophils Absolute: 0 10*3/uL (ref 0.0–0.1)
Basophils Relative: 0 %
Eosinophils Absolute: 0 10*3/uL (ref 0.0–0.5)
Eosinophils Relative: 0 %
HCT: 36.6 % (ref 36.0–46.0)
Hemoglobin: 12 g/dL (ref 12.0–15.0)
Immature Granulocytes: 1 %
Lymphocytes Relative: 5 %
Lymphs Abs: 0.6 10*3/uL — ABNORMAL LOW (ref 0.7–4.0)
MCH: 29.6 pg (ref 26.0–34.0)
MCHC: 32.8 g/dL (ref 30.0–36.0)
MCV: 90.1 fL (ref 80.0–100.0)
Monocytes Absolute: 0.9 10*3/uL (ref 0.1–1.0)
Monocytes Relative: 8 %
Neutro Abs: 10.5 10*3/uL — ABNORMAL HIGH (ref 1.7–7.7)
Neutrophils Relative %: 86 %
Platelets: 269 10*3/uL (ref 150–400)
RBC: 4.06 MIL/uL (ref 3.87–5.11)
RDW: 15.2 % (ref 11.5–15.5)
WBC: 12.1 10*3/uL — ABNORMAL HIGH (ref 4.0–10.5)
nRBC: 0 % (ref 0.0–0.2)

## 2023-01-04 LAB — BASIC METABOLIC PANEL
Anion gap: 11 (ref 5–15)
BUN: 21 mg/dL (ref 8–23)
CO2: 22 mmol/L (ref 22–32)
Calcium: 10.2 mg/dL (ref 8.9–10.3)
Chloride: 98 mmol/L (ref 98–111)
Creatinine, Ser: 0.7 mg/dL (ref 0.44–1.00)
GFR, Estimated: >60 mL/min (ref >60)
Glucose, Bld: 148 mg/dL — ABNORMAL HIGH (ref 70–99)
Potassium: 3.1 mmol/L — ABNORMAL LOW (ref 3.5–5.1)
Sodium: 131 mmol/L — ABNORMAL LOW (ref 135–145)

## 2023-01-04 LAB — TROPONIN I (HIGH SENSITIVITY): Troponin I (High Sensitivity): 6 ng/L (ref <18)

## 2023-01-04 MED ORDER — FENTANYL CITRATE PF 50 MCG/ML IJ SOSY
50.0000 ug | PREFILLED_SYRINGE | Freq: Once | INTRAMUSCULAR | Status: AC
  Administered 2023-01-04: 50 ug via INTRAVENOUS
  Filled 2023-01-04: qty 1

## 2023-01-04 MED ORDER — OXYCODONE HCL 5 MG PO TABS: 5.0000 mg | ORAL_TABLET | Freq: Three times a day (TID) | ORAL | 0 refills | Status: DC | PRN

## 2023-01-04 NOTE — Discharge Instructions (Signed)
Cardiac workup today is normal.  Overall suspect that this is muscular pain.  I prescribed you narcotic pain medicine called Roxicodone for this.  Please be careful with its use as this medication is sedating.  Do not mix with alcohol or other drugs.  Do not drive or do any dangerous activities while taking this medicine.  Use this medication instead of tramadol.  Follow-up with your primary care doctor.

## 2023-01-04 NOTE — ED Provider Notes (Signed)
[facility] Provider Note   CSN: [id number] Arrival date & time: 01/04/23  2003     History  Chief Complaint  Patient presents with   Shoulder Pain    Kathryn Ayala is a 88 y.o. female.  Patient here with bilateral shoulder pain.  Concerned maybe she is having heart issue.  History of the same.  Nothing makes it worse or better.  Tylenol at home not helping.  History of A-fib, hypertension, stroke, anxiety, high cholesterol, pneumonia.  She denies any infectious symptoms.  Movement makes it worse.  Patient takes Eliquis.  Denies any falls.  No chest pain or shortness of breath.  The history is provided by the patient.       Home Medications Prior to Admission medications   Medication Sig Start Date End Date Taking? Authorizing Provider  oxyCODONE (ROXICODONE) 5 MG immediate release tablet Take 1 tablet (5 mg total) by mouth every 8 (eight) hours as needed for up to 10 doses for severe pain. 01/04/23  Yes Keigan Tafoya, DO  acetaminophen (TYLENOL) 500 MG tablet Take 1,000 mg by mouth every 6 (six) hours as needed for moderate pain.    [provider]  allopurinol (ZYLOPRIM) 100 MG tablet Take 100 mg by mouth 2 (two) times daily.  03/04/16   [provider]  atenolol (TENORMIN) 50 MG tablet Take 50 mg by mouth 2 (two) times daily. 05/05/16   [provider]  B Complex-C (B-COMPLEX WITH VITAMIN C) tablet Take 1 tablet by mouth daily. 11/12/22   Lavina Hamman, MD  Biotin (BIOTIN 5000) 5 MG CAPS Take 5 mg by mouth daily.    [provider]  Cholecalciferol (VITAMIN D3) 50 MCG (2000 UT) CAPS Take 2,000 Units by mouth daily.    [provider]  docusate sodium (COLACE) 100 MG capsule Take 1 capsule (100 mg total) by mouth 2 (two) times daily. 11/11/22   Lavina Hamman, MD  ELIQUIS 5 MG TABS tablet TAKE 1 TABLET BY MOUTH TWICE A DAY 11/14/19   Adrian Prows, MD  feeding supplement (ENSURE ENLIVE /  ENSURE PLUS) LIQD Take 237 mLs by mouth daily. 11/11/22   Lavina Hamman, MD  ferrous sulfate 325 (65 FE) MG tablet Take 1 tablet (325 mg total) by mouth daily with breakfast. 11/11/22 12/11/22  Lavina Hamman, MD  loratadine (CLARITIN) 10 MG tablet Take 10 mg by mouth daily.    [provider]  methocarbamol (ROBAXIN) 500 MG tablet Take 1 tablet (500 mg total) by mouth every 6 (six) hours as needed for muscle spasms. 11/11/22   Irving Copas, PA-C  rosuvastatin (CRESTOR) 5 MG tablet Take 5 mg by mouth daily.    [provider]  venlafaxine XR (EFFEXOR-XR) 37.5 MG 24 hr capsule Take 37.5 mg by mouth daily.    [provider]  vitamin B-12 (CYANOCOBALAMIN) 500 MCG tablet Take 500 mcg by mouth daily.    [provider]  vitamin E 180 MG (400 UNITS) capsule Take 400 Units by mouth daily.    [provider]      Allergies    Penicillins, Hydrocodone, Azithromycin, Codeine, Levaquin [levofloxacin], and Sulfa antibiotics    Review of Systems   Review of Systems  Physical Exam Updated Vital Signs BP (!) 159/82   Pulse 92   Temp 99.3 F (37.4 C) (Oral)   Resp (!) 25   Ht 5\' 3"  (1.6 m)   Wt  64.4 kg   SpO2 95%   BMI 25.15 kg/m  Physical Exam Vitals and nursing note reviewed.  Constitutional:      General: She is not in acute distress.    Appearance: She is well-developed. She is not ill-appearing.  HENT:     Head: Normocephalic and atraumatic.     Nose: Nose normal.     Mouth/Throat:     Mouth: Mucous membranes are moist.  Eyes:     Extraocular Movements: Extraocular movements intact.     Conjunctiva/sclera: Conjunctivae normal.     Pupils: Pupils are equal, round, and reactive to light.  Cardiovascular:     Rate and Rhythm: Normal rate and regular rhythm.     Pulses: Normal pulses.     Heart sounds: Normal heart sounds. No murmur heard. Pulmonary:     Effort: Pulmonary effort is normal. No respiratory distress.     Breath sounds:  Normal breath sounds.  Abdominal:     Palpations: Abdomen is soft.     Tenderness: There is no abdominal tenderness.  Musculoskeletal:        General: No swelling.     Cervical back: Normal range of motion and neck supple.     Comments: When I range both shoulder she has discomfort.  But no obvious deformity.  Skin:    General: Skin is warm and dry.     Capillary Refill: Capillary refill takes less than 2 seconds.  Neurological:     General: No focal deficit present.     Mental Status: She is alert and oriented to person, place, and time.     Cranial Nerves: No cranial nerve deficit.     Sensory: No sensory deficit.     Motor: No weakness.     Coordination: Coordination normal.  Psychiatric:        Mood and Affect: Mood normal.     ED Results / Procedures / Treatments   Labs (all labs ordered are listed, but only abnormal results are displayed) Labs Reviewed  CBC WITH DIFFERENTIAL/PLATELET - Abnormal; Notable for the following components:      Result Value   WBC 12.1 (*)    Neutro Abs 10.5 (*)    Lymphs Abs 0.6 (*)    All other components within normal limits  BASIC METABOLIC PANEL - Abnormal; Notable for the following components:   Sodium 131 (*)    Potassium 3.1 (*)    Glucose, Bld 148 (*)    All other components within normal limits  TROPONIN I (HIGH SENSITIVITY)    EKG EKG Interpretation  Date/Time:  Sunday January 04 2023 20:18:48 EDT Ventricular Rate:  103 PR Interval:    QRS Duration: 73 QT Interval:  314 QTC Calculation: 411 R Axis:   67 Text Interpretation: Atrial fibrillation Probable anterior infarct, age indeterminate Confirmed by Lennice Sites [phone]) on 01/04/2023 8:24:44 PM  Radiology DG Chest Portable 1 View  Result Date: 01/04/2023 CLINICAL DATA:  Bilateral shoulder pain EXAM: PORTABLE CHEST 1 VIEW COMPARISON:  11/07/2022 FINDINGS: For lungs are clear. No pneumothorax or pleural effusion. Stable cardiomegaly. Pulmonary vascularity is normal. No  acute bone abnormality IMPRESSION: 1. No active disease. Stable cardiomegaly. Electronically Signed   By: Fidela Salisbury M.D.   On: 01/04/2023 20:48    Procedures Procedures    Medications Ordered in ED Medications  fentaNYL (SUBLIMAZE) injection 50 mcg (50 mcg Intravenous Given 01/04/23 2041)    ED Course/ Medical Decision Making/ A&P  Medical Decision Making Amount and/or Complexity of Data Reviewed Labs: ordered. Radiology: ordered.  Risk Prescription drug management.   Nicha Zora is here with shoulder pain.  Left worse than the right.  EKG upon arrival shows atrial fibrillation at 103.  No ischemic changes.  Appears to have reproducible pain when I range her shoulders.  Differential diagnosis is that this is likely arthritic in nature but will evaluate for cardiac etiology with troponin, chest x-ray and basic labs.  Will give IV fentanyl and reevaluate.  She is neurovascular neuromuscular intact.  Have no concern for dissection or PE given history and physical.  She is on anticoagulation as well.  Per my review and interpretation labs no significant anemia or electrolyte abnormality or kidney injury or leukocytosis.  Troponin normal.  Chest x-ray per my review and interpretation shows no evidence of pneumonia or pneumothorax.  She is feeling better after pain medicine.  Overall suspect MSK pain.  Discharged in good condition.  This chart was dictated using voice recognition software.  Despite best efforts to proofread,  errors can occur which can change the documentation meaning.         Final Clinical Impression(s) / ED Diagnoses Final diagnoses:  Acute shoulder pain, unspecified laterality    Rx / DC Orders ED Discharge Orders          Ordered    oxyCODONE (ROXICODONE) 5 MG immediate release tablet  Every 8 hours PRN        01/04/23 2100              Lennice Sites, DO 01/04/23 2130

## 2023-01-04 NOTE — ED Triage Notes (Signed)
Patient here POV from Home.  Endorses Bilateral Shoulder Pain that began yesterday PM. No recent Trauma or Injury. Spasm-Like. No SOB.    NAD Noted during Triage. A&Ox4. GCS 15. BIB Wheelchair.

## 2023-01-06 DIAGNOSIS — M25512 Pain in left shoulder: Secondary | ICD-10-CM | POA: Diagnosis not present

## 2023-01-09 DIAGNOSIS — R2689 Other abnormalities of gait and mobility: Secondary | ICD-10-CM | POA: Diagnosis not present

## 2023-01-09 DIAGNOSIS — Z96642 Presence of left artificial hip joint: Secondary | ICD-10-CM | POA: Diagnosis not present

## 2023-01-13 DIAGNOSIS — Z96642 Presence of left artificial hip joint: Secondary | ICD-10-CM | POA: Diagnosis not present

## 2023-01-13 DIAGNOSIS — R2689 Other abnormalities of gait and mobility: Secondary | ICD-10-CM | POA: Diagnosis not present

## 2023-01-20 DIAGNOSIS — Z1152 Encounter for screening for COVID-19: Secondary | ICD-10-CM | POA: Diagnosis not present

## 2023-01-20 DIAGNOSIS — R0981 Nasal congestion: Secondary | ICD-10-CM | POA: Diagnosis not present

## 2023-01-20 DIAGNOSIS — R059 Cough, unspecified: Secondary | ICD-10-CM | POA: Diagnosis not present

## 2023-01-20 DIAGNOSIS — R531 Weakness: Secondary | ICD-10-CM | POA: Diagnosis not present

## 2023-01-20 DIAGNOSIS — R509 Fever, unspecified: Secondary | ICD-10-CM | POA: Diagnosis not present

## 2023-01-20 DIAGNOSIS — I272 Pulmonary hypertension, unspecified: Secondary | ICD-10-CM | POA: Diagnosis not present

## 2023-01-20 DIAGNOSIS — D0512 Intraductal carcinoma in situ of left breast: Secondary | ICD-10-CM | POA: Diagnosis not present

## 2023-01-20 DIAGNOSIS — M353 Polymyalgia rheumatica: Secondary | ICD-10-CM | POA: Diagnosis not present

## 2023-01-20 DIAGNOSIS — U071 COVID-19: Secondary | ICD-10-CM | POA: Diagnosis not present

## 2023-01-20 DIAGNOSIS — I480 Paroxysmal atrial fibrillation: Secondary | ICD-10-CM | POA: Diagnosis not present

## 2023-01-20 DIAGNOSIS — I129 Hypertensive chronic kidney disease with stage 1 through stage 4 chronic kidney disease, or unspecified chronic kidney disease: Secondary | ICD-10-CM | POA: Diagnosis not present

## 2023-01-20 DIAGNOSIS — N1831 Chronic kidney disease, stage 3a: Secondary | ICD-10-CM | POA: Diagnosis not present

## 2023-02-03 ENCOUNTER — Encounter: Payer: Self-pay | Admitting: Cardiology

## 2023-02-03 ENCOUNTER — Ambulatory Visit: Payer: Medicare PPO | Admitting: Cardiology

## 2023-02-03 VITALS — BP 119/71 | HR 90 | Resp 16 | Ht 63.0 in | Wt 146.6 lb

## 2023-02-03 DIAGNOSIS — I10 Essential (primary) hypertension: Secondary | ICD-10-CM

## 2023-02-03 DIAGNOSIS — I4821 Permanent atrial fibrillation: Secondary | ICD-10-CM

## 2023-02-03 DIAGNOSIS — I340 Nonrheumatic mitral (valve) insufficiency: Secondary | ICD-10-CM | POA: Diagnosis not present

## 2023-02-03 NOTE — Progress Notes (Signed)
Primary Physician/Referring:  Rodrigo Ran, MD  Patient ID: Kathryn Ayala, female    DOB: 06/10/1935, 88 y.o.   MRN: [id number]  Chief Complaint  Patient presents with  . Mitral Valve Prolapse  . Hypertension  . Atrial Fibrillation  . Follow-up    6 months   HPI:    Kathryn Ayala  is a 88 y.o.  Caucasian female with permanent atrial fibrillation, essential hypertension, hyperlipidemia, mild chronic dyspnea with a negative nuclear stress test in 2018, moderate to moderately severe MR and TR with mild pulmonary hypertension presents here for 86-month office visit.  She has continued to remain active and denies any dyspnea or chest pain or palpitations.  She lives independently.  No PND or orthopnea.  Surprisingly in spite of moderate to severe mitral regurgitation, patient has endured 3 major operations.  In June 2023 she underwent left knee replacement and in December 2023 right knee replacement and then in February 2024 she had a fall and had right hip arthroplasty.   Past Medical History:  Diagnosis Date  . Anxiety   . Arthritis   . Atrial fibrillation   . Breast cancer, left   . Complication of anesthesia    rapid heart beat afterwards sometimes  . Depression   . Dysrhythmia    A-Fib  . History of kidney stones   . Hyperlipidemia   . Hypertension   . Pneumonia   . 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 Tachycardia   . Urinary frequency    Past Surgical History:  Procedure Laterality Date  . ABDOMINAL HYSTERECTOMY  1996  . APPENDECTOMY  1956  . BREAST BIOPSY Left 05/07/2016  . BREAST BIOPSY Bilateral 1956, 1980  . BREAST LUMPECTOMY WITH RADIOACTIVE SEED LOCALIZATION Left 06/16/2016   Procedure: LEFT BREAST LUMPECTOMY WITH RADIOACTIVE SEED LOCALIZATION;  Surgeon: Chevis Pretty III, MD;  Location: [facility];  Service: General;  Laterality: Left;  . COLONOSCOPY    . KIDNEY STONE SURGERY    . KNEE SURGERY  11/20/2004  . TOTAL HIP ARTHROPLASTY  Left 11/09/2022   Procedure: TOTAL HIP ARTHROPLASTY ANTERIOR APPROACH;  Surgeon: Durene Romans, MD;  Location: [facility];  Service: Orthopedics;  Laterality: Left;  . TOTAL KNEE ARTHROPLASTY Right 04/07/2022   Procedure: TOTAL KNEE ARTHROPLASTY;  Surgeon: Ollen Gross, MD;  Location: [facility];  Service: Orthopedics;  Laterality: Right;  . TOTAL KNEE ARTHROPLASTY Left 09/22/2022   Procedure: TOTAL KNEE ARTHROPLASTY;  Surgeon: Ollen Gross, MD;  Location: [facility];  Service: Orthopedics;  Laterality: Left;  . WRIST SURGERY Left 2018   Social History   Tobacco Use  . Smoking status: Never    Passive exposure: Never  . Smokeless tobacco: Never  Substance Use Topics  . Alcohol use: No   Widowed    ROS  Review of Systems  Cardiovascular:  Positive for dyspnea on exertion and palpitations (occasional). Negative for chest pain and leg swelling.  Gastrointestinal:  Negative for melena.   Objective      02/03/2023   11:12 AM 01/04/2023    9:15 PM 01/04/2023    9:00 PM  Vitals with BMI  Height     Weight 146 lbs 10 oz    BMI 25.98    Systolic 119  159  Diastolic 71  82  Pulse 90 92 97      Physical Exam Constitutional:      Comments: She is moderately built  and well-nourished in no acute distress.  Neck:     Vascular: No carotid bruit or JVD.  Cardiovascular:     Rate and Rhythm: Normal rate. Rhythm irregular.     Pulses: Normal pulses and intact distal pulses.     Heart sounds: S1 normal and S2 normal. Murmur heard.     High-pitched early systolic murmur is present with a grade of 2/6 at the upper right sternal border and apex.     High-pitched blowing early diastolic murmur is present with a grade of 2/4 at the upper right sternal border.     No gallop.  Pulmonary:     Effort: Pulmonary effort is normal.     Breath sounds: Normal breath sounds.  Abdominal:     General: Bowel sounds are normal.     Palpations: Abdomen is soft.  Musculoskeletal:     Right lower leg: No  edema.     Left lower leg: No edema.   Laboratory examination:   External labs:  Labs 12/25/2021:  Seromucous 86 mg, BUN 39, creatinine 1.0, EGFR 52 mL, potassium 3.9.  Hb 14.1/HCT 40.9, platelets 173, normal indicis.  Labs 09/09/2021:  BUN 31, creatinine 1.1, EGFR 47 mL, potassium 3.8, CMP otherwise normal.  Hb 14.5/HCT 43.8, platelets 190.  Normal indicis.  Total cholesterol 198, triglycerides 206, HDL 69, LDL 88.  TSH normal.  A1c 6.1%.  Radiology:   Cardiac Studies:   Lexiscan myoview stress test 12/12/2016: 1. The resting electrocardiogram demonstrated atrial fibrillation, normal resting conduction and nonspecific ST-T changes, cannot exclude inferolateral ischemia.  Stress EKG was nondiagnostic for ischemia as it's a pharmacologic stress test.  Stress symptoms included dyspnea. 2. Myocardial perfusion imaging is normal. Overall left ventricular systolic function was normal without regional wall motion abnormalities. The left ventricular ejection fraction was 73%. No significant change from Lexiscan stress 12/19/11.  PCV ECHOCARDIOGRAM COMPLETE 07/24/2022  Narrative Echocardiogram 07/24/2022: Normal LV systolic function with visual EF 60-65%. Left ventricle cavity is normal in size. Normal left ventricular wall thickness. Normal global wall motion. Indeterminate diastolic filling pattern. Calculated EF 69%. Left atrial cavity is severely dilated at 78.3 ml/m^2. Right atrial cavity is severely dilated. Right ventricle cavity is mildly dilated. Normal right ventricular function. Structurally normal trileaflet aortic valve.  Mild (Grade I) aortic regurgitation. Moderate to severe mitral regurgitation. Mild mitral valve leaflet calcification. Structurally normal tricuspid valve.  Severe tricuspid regurgitation. Moderate pulmonary hypertension. RVSP measures 53 mmHg. Structurally normal pulmonic valve.  Mild pulmonic regurgitation. IVC is dilated with respiratory variation. No  significant change compared to 08/2021.   EKG:  EKG 02/03/2023: Atrial fibrillation with controlled ventricular response at rate of 75 bpm, poor R progression, probably normal variant but cannot exclude anteroseptal infarct old.  Nonspecific T abnormality.  Compared to 08/04/2022, no significant change.   Medications and allergies   Allergies  Allergen Reactions  . Penicillins Anaphylaxis, Swelling and Rash    Has patient had a PCN reaction causing immediate rash, facial/tongue/throat swelling, SOB or lightheadedness with hypotension: Yes Has patient had a PCN reaction causing severe rash involving mucus membranes or skin necrosis: Yes Has patient had a PCN reaction that required hospitalization No Has patient had a PCN reaction occurring within the last 10 years: No If all of the above answers are "NO", then may proceed with Cephalosporin use.   Marland Kitchen Hydrocodone Other (See Comments)    Makes very confused  . Azithromycin     PT unsure of reaction   .  Codeine Other (See Comments)    UNSPECIFIED REACTION Patient was told not to take it.  . Levaquin [Levofloxacin] Other (See Comments)    UNSPECIFIED REACTION Patient was told not to take it.  . Sulfa Antibiotics Itching and Rash     Current Outpatient Medications:  .  acetaminophen (TYLENOL) 500 MG tablet, Take 1,000 mg by mouth every 6 (six) hours as needed for moderate pain., Disp: , Rfl:  .  allopurinol (ZYLOPRIM) 100 MG tablet, Take 100 mg by mouth 2 (two) times daily. , Disp: , Rfl: 3 .  atenolol (TENORMIN) 50 MG tablet, Take 50 mg by mouth 2 (two) times daily., Disp: , Rfl: 2 .  B Complex-C (B-COMPLEX WITH VITAMIN C) tablet, Take 1 tablet by mouth daily., Disp: 30 tablet, Rfl: 0 .  Biotin (BIOTIN 5000) 5 MG CAPS, Take 5 mg by mouth daily., Disp: , Rfl:  .  Cholecalciferol (VITAMIN D3) 50 MCG (2000 UT) CAPS, Take 2,000 Units by mouth daily., Disp: , Rfl:  .  ELIQUIS 5 MG TABS tablet, TAKE 1 TABLET BY MOUTH TWICE A DAY, Disp: 180  tablet, Rfl: 1 .  feeding supplement (ENSURE ENLIVE / ENSURE PLUS) LIQD, Take 237 mLs by mouth daily., Disp: 10000 mL, Rfl: 0 .  ferrous sulfate 325 (65 FE) MG tablet, Take 1 tablet (325 mg total) by mouth daily with breakfast., Disp: 30 tablet, Rfl: 0 .  loratadine (CLARITIN) 10 MG tablet, Take 10 mg by mouth daily., Disp: , Rfl:  .  rosuvastatin (CRESTOR) 5 MG tablet, Take 5 mg by mouth daily., Disp: , Rfl:  .  venlafaxine XR (EFFEXOR-XR) 37.5 MG 24 hr capsule, Take 37.5 mg by mouth daily., Disp: , Rfl:  .  vitamin B-12 (CYANOCOBALAMIN) 500 MCG tablet, Take 500 mcg by mouth daily., Disp: , Rfl:  .  vitamin E 180 MG (400 UNITS) capsule, Take 400 Units by mouth daily., Disp: , Rfl:  .  oxyCODONE (ROXICODONE) 5 MG immediate release tablet, Take 1 tablet (5 mg total) by mouth every 8 (eight) hours as needed for up to 10 doses for severe pain., Disp: 10 tablet, Rfl: 0   Assessment     ICD-10-CM   1. Permanent atrial fibrillation  I48.21 EKG 12-Lead    2. Moderate to severe mitral regurgitation  I34.0     3. Primary hypertension  I10      CHA2DS2-VASc Score is 4.  Yearly risk of stroke: 4.8% (A, F, HTN).  Score of 1=0.6; 2=2.2; 3=3.2; 4=4.8; 5=7.2; 6=9.8; 7=>9.8) -(CHF; HTN; vasc disease DM,  Female = 1; Age <65 =0; 65-74 = 1,  >75 =2; stroke/embolism= 2).    No orders of the defined types were placed in this encounter.    Medications Discontinued During This Encounter  Medication Reason  . methocarbamol (ROBAXIN) 500 MG tablet   . docusate sodium (COLACE) 100 MG capsule     Recommendations:   Kathryn Ayala  is a 88 y.o.   Caucasian female with permanent atrial fibrillation, essential hypertension, hyperlipidemia, mild chronic dyspnea with a negative nuclear stress test in 2018, moderate to moderately severe MR and TR with mild pulmonary hypertension presents here for 72-month office visit.  1. Permanent atrial fibrillation Patient with permanent atrial fibrillation, tolerating  anticoagulation without bleeding diathesis.  She continues to remain active. - EKG 12-Lead  2. Moderate to severe mitral regurgitation Surprisingly in spite of moderate to severe mitral regurgitation, patient has endured 3 major operations.  In June 2023  she underwent left knee replacement and in December 2023 right knee replacement and then in February 2024 she had a fall and had right hip arthroplasty.  No clinical evidence of heart failure.  She is now 88 years of age.  She still continues to remain active and independent.  3. Primary hypertension Blood pressure is under excellent control.  No changes in the medications were done today.  Renal function is normal.  External Labs reviewed.    Yates Decamp, MD, [facility] 02/03/2023, 12:21 PM Office: [phone] Pager: [phone]

## 2023-02-11 DIAGNOSIS — Z96652 Presence of left artificial knee joint: Secondary | ICD-10-CM | POA: Diagnosis not present

## 2023-02-11 DIAGNOSIS — M25522 Pain in left elbow: Secondary | ICD-10-CM | POA: Diagnosis not present

## 2023-02-11 DIAGNOSIS — M25562 Pain in left knee: Secondary | ICD-10-CM | POA: Diagnosis not present

## 2023-02-11 DIAGNOSIS — Z96642 Presence of left artificial hip joint: Secondary | ICD-10-CM | POA: Diagnosis not present

## 2023-02-11 DIAGNOSIS — M7541 Impingement syndrome of right shoulder: Secondary | ICD-10-CM | POA: Diagnosis not present

## 2023-02-11 DIAGNOSIS — Z471 Aftercare following joint replacement surgery: Secondary | ICD-10-CM | POA: Diagnosis not present

## 2023-02-23 DIAGNOSIS — L570 Actinic keratosis: Secondary | ICD-10-CM | POA: Diagnosis not present

## 2023-02-23 DIAGNOSIS — L821 Other seborrheic keratosis: Secondary | ICD-10-CM | POA: Diagnosis not present

## 2023-02-23 DIAGNOSIS — D692 Other nonthrombocytopenic purpura: Secondary | ICD-10-CM | POA: Diagnosis not present

## 2023-03-02 DIAGNOSIS — M8580 Other specified disorders of bone density and structure, unspecified site: Secondary | ICD-10-CM | POA: Diagnosis not present

## 2023-03-02 DIAGNOSIS — R32 Unspecified urinary incontinence: Secondary | ICD-10-CM | POA: Diagnosis not present

## 2023-03-02 DIAGNOSIS — F419 Anxiety disorder, unspecified: Secondary | ICD-10-CM | POA: Diagnosis not present

## 2023-03-02 DIAGNOSIS — E785 Hyperlipidemia, unspecified: Secondary | ICD-10-CM | POA: Diagnosis not present

## 2023-03-02 DIAGNOSIS — J309 Allergic rhinitis, unspecified: Secondary | ICD-10-CM | POA: Diagnosis not present

## 2023-03-02 DIAGNOSIS — R269 Unspecified abnormalities of gait and mobility: Secondary | ICD-10-CM | POA: Diagnosis not present

## 2023-03-02 DIAGNOSIS — G629 Polyneuropathy, unspecified: Secondary | ICD-10-CM | POA: Diagnosis not present

## 2023-03-02 DIAGNOSIS — I129 Hypertensive chronic kidney disease with stage 1 through stage 4 chronic kidney disease, or unspecified chronic kidney disease: Secondary | ICD-10-CM | POA: Diagnosis not present

## 2023-03-02 DIAGNOSIS — N1831 Chronic kidney disease, stage 3a: Secondary | ICD-10-CM | POA: Diagnosis not present

## 2023-03-24 DIAGNOSIS — Z961 Presence of intraocular lens: Secondary | ICD-10-CM | POA: Diagnosis not present

## 2023-05-18 ENCOUNTER — Telehealth: Payer: Self-pay | Admitting: Cardiology

## 2023-05-18 DIAGNOSIS — I480 Paroxysmal atrial fibrillation: Secondary | ICD-10-CM | POA: Diagnosis not present

## 2023-05-18 DIAGNOSIS — R600 Localized edema: Secondary | ICD-10-CM | POA: Diagnosis not present

## 2023-05-18 DIAGNOSIS — N1831 Chronic kidney disease, stage 3a: Secondary | ICD-10-CM | POA: Diagnosis not present

## 2023-05-18 DIAGNOSIS — I4821 Permanent atrial fibrillation: Secondary | ICD-10-CM

## 2023-05-18 DIAGNOSIS — M353 Polymyalgia rheumatica: Secondary | ICD-10-CM | POA: Diagnosis not present

## 2023-05-18 DIAGNOSIS — I340 Nonrheumatic mitral (valve) insufficiency: Secondary | ICD-10-CM

## 2023-05-18 DIAGNOSIS — I1310 Hypertensive heart and chronic kidney disease without heart failure, with stage 1 through stage 4 chronic kidney disease, or unspecified chronic kidney disease: Secondary | ICD-10-CM | POA: Diagnosis not present

## 2023-05-18 DIAGNOSIS — M25551 Pain in right hip: Secondary | ICD-10-CM | POA: Diagnosis not present

## 2023-05-18 DIAGNOSIS — I272 Pulmonary hypertension, unspecified: Secondary | ICD-10-CM | POA: Diagnosis not present

## 2023-05-18 NOTE — Telephone Encounter (Signed)
External labs:  Labs 05/18/2023:  Serum glucose 94 mg, BUN 18, creatinine 0.8, EGFR 68 mL, potassium 3.8, sodium 141.  Hb 12.7/HCT 40.7, platelets 239, mild microcytosis present.    ICD-10-CM   1. Moderate to severe mitral regurgitation  I34.0 PCV ECHOCARDIOGRAM COMPLETE    Pro b natriuretic peptide (BNP)    2. Permanent atrial fibrillation (HCC)  I48.21 PCV ECHOCARDIOGRAM COMPLETE    Pro b natriuretic peptide (BNP)    3. Bilateral leg edema  R60.0 PCV ECHOCARDIOGRAM COMPLETE    Pro b natriuretic peptide (BNP)     Orders Placed This Encounter  Procedures   Pro b natriuretic peptide (BNP)   PCV ECHOCARDIOGRAM COMPLETE    Standing Status:   Future    Standing Expiration Date:   05/17/2024

## 2023-05-20 ENCOUNTER — Telehealth: Payer: Self-pay

## 2023-05-20 NOTE — Telephone Encounter (Signed)
Pt called to request an appt with Dr. Myra Gianotti d/t BL feet swelling.  Reviewed pt's chart, returned call for clarification, no answer, lf vm.

## 2023-05-21 DIAGNOSIS — M25511 Pain in right shoulder: Secondary | ICD-10-CM | POA: Diagnosis not present

## 2023-06-02 DIAGNOSIS — M25551 Pain in right hip: Secondary | ICD-10-CM | POA: Diagnosis not present

## 2023-06-02 DIAGNOSIS — M19011 Primary osteoarthritis, right shoulder: Secondary | ICD-10-CM | POA: Diagnosis not present

## 2023-06-02 DIAGNOSIS — M25511 Pain in right shoulder: Secondary | ICD-10-CM | POA: Diagnosis not present

## 2023-06-09 ENCOUNTER — Ambulatory Visit: Payer: Medicare PPO | Admitting: Cardiology

## 2023-06-09 ENCOUNTER — Encounter: Payer: Self-pay | Admitting: Cardiology

## 2023-06-09 VITALS — BP 136/77 | HR 76 | Resp 16 | Ht 63.0 in | Wt 149.0 lb

## 2023-06-09 DIAGNOSIS — I4821 Permanent atrial fibrillation: Secondary | ICD-10-CM | POA: Diagnosis not present

## 2023-06-09 DIAGNOSIS — I340 Nonrheumatic mitral (valve) insufficiency: Secondary | ICD-10-CM | POA: Diagnosis not present

## 2023-06-09 DIAGNOSIS — I10 Essential (primary) hypertension: Secondary | ICD-10-CM

## 2023-06-09 DIAGNOSIS — R600 Localized edema: Secondary | ICD-10-CM

## 2023-06-09 NOTE — Progress Notes (Signed)
Primary Physician/Referring:  Rodrigo Ran, MD  Patient ID: Kathryn Ayala, female    DOB: 04/11/1935, 88 y.o.   MRN: [id number]  Chief Complaint  Patient presents with   Permanent atrial fibrillation    Follow-up   HPI:    Kathryn Ayala  is a 88 y.o.  Caucasian female with permanent atrial fibrillation, essential hypertension, hyperlipidemia, mild chronic dyspnea with a negative nuclear stress test in 2018, moderate to moderately severe MR and TR with mild pulmonary hypertension presents here for 58-month office visit.  She has continued to remain active and denies any dyspnea or chest pain or palpitations.  She lives independently.  No PND or orthopnea.  Surprisingly in spite of moderate to severe mitral regurgitation, patient has endured 3 major operations.  In June 2023 she underwent left knee replacement and in December 2023 right knee replacement and then in February 2024 she had a fall and had right hip arthroplasty.    She made an appointment to see [place] in view of leg edema and question about heart failure. 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   Tachycardia    Urinary frequency    Past Surgical History:  Procedure Laterality Date   ABDOMINAL HYSTERECTOMY  1996   APPENDECTOMY  1956   BREAST BIOPSY Left 05/07/2016   BREAST BIOPSY Bilateral 1956, 1980   BREAST LUMPECTOMY WITH RADIOACTIVE SEED LOCALIZATION Left 06/16/2016   Procedure: LEFT BREAST LUMPECTOMY WITH RADIOACTIVE SEED LOCALIZATION;  Surgeon: Chevis Pretty III, MD;  Location: [facility] OR;  Service: General;  Laterality: Left;   COLONOSCOPY     KIDNEY STONE SURGERY     KNEE SURGERY   11/20/2004   TOTAL HIP ARTHROPLASTY Left 11/09/2022   Procedure: TOTAL HIP ARTHROPLASTY ANTERIOR APPROACH;  Surgeon: Durene Romans, MD;  Location: [facility];  Service: Orthopedics;  Laterality: Left;   TOTAL KNEE ARTHROPLASTY Right 04/07/2022   Procedure: TOTAL KNEE ARTHROPLASTY;  Surgeon: Ollen Gross, MD;  Location: [facility];  Service: Orthopedics;  Laterality: Right;   TOTAL KNEE ARTHROPLASTY Left 09/22/2022   Procedure: TOTAL KNEE ARTHROPLASTY;  Surgeon: Ollen Gross, MD;  Location: [facility];  Service: Orthopedics;  Laterality: Left;   WRIST SURGERY Left 2018   Social History   Tobacco Use   Smoking status: Never    Passive exposure: Never   Smokeless tobacco: Never  Substance Use Topics   Alcohol use: No   Widowed    ROS  Review of Systems  Cardiovascular:  Positive for palpitations (occasional). Negative for chest pain, dyspnea on exertion and leg swelling.  Gastrointestinal:  Negative for melena.   Objective      06/09/2023   11:08 AM 02/03/2023   11:12 AM 01/04/2023    9:15 PM  Vitals with BMI  Height 5\' 3"  5\' 3"    Weight 149 lbs 146 lbs 10 oz   BMI 26.4 25.98   Systolic 136 119   Diastolic 77 71   Pulse 76 90 92      Physical Exam Constitutional:  Comments: She is moderately built and well-nourished in no acute distress.  Neck:     Vascular: No carotid bruit or JVD.  Cardiovascular:     Rate and Rhythm: Normal rate. Rhythm irregular.     Pulses: Normal pulses and intact distal pulses.     Heart sounds: S1 normal and S2 normal. Murmur heard.     High-pitched early systolic murmur is present with a grade of 2/6 at the upper right sternal border and apex.     High-pitched blowing early diastolic murmur is present with a grade of 2/4 at the upper right sternal border.     No gallop.  Pulmonary:     Effort: Pulmonary effort is normal.     Breath sounds: Normal breath sounds.  Abdominal:     General: Bowel sounds are normal.     Palpations: Abdomen is soft.   Musculoskeletal:     Right lower leg: Edema (trace ankle edema) present.     Left lower leg: Edema (trace edema) present.    Laboratory examination:   External labs:  Labs 12/25/2021:  Seromucous 86 mg, BUN 39, creatinine 1.0, EGFR 52 mL, potassium 3.9.  Hb 14.1/HCT 40.9, platelets 173, normal indicis.  Labs 09/09/2021:  BUN 31, creatinine 1.1, EGFR 47 mL, potassium 3.8, CMP otherwise normal.  Hb 14.5/HCT 43.8, platelets 190.  Normal indicis.  Total cholesterol 198, triglycerides 206, HDL 69, LDL 88.  TSH normal.  A1c 6.1%.  Radiology:   Cardiac Studies:   Lexiscan myoview stress test 12/12/2016: 1. The resting electrocardiogram demonstrated atrial fibrillation, normal resting conduction and nonspecific ST-T changes, cannot exclude inferolateral ischemia.  Stress EKG was nondiagnostic for ischemia as it's a pharmacologic stress test.  Stress symptoms included dyspnea. 2. Myocardial perfusion imaging is normal. Overall left ventricular systolic function was normal without regional wall motion abnormalities. The left ventricular ejection fraction was 73%. No significant change from Lexiscan stress 12/19/11.  PCV ECHOCARDIOGRAM COMPLETE 07/24/2022  Narrative Echocardiogram 07/24/2022: Normal LV systolic function with visual EF 60-65%. Left ventricle cavity is normal in size. Normal left ventricular wall thickness. Normal global wall motion. Indeterminate diastolic filling pattern. Calculated EF 69%. Left atrial cavity is severely dilated at 78.3 ml/m^2.  Right atrial cavity is severely dilated. Right ventricle cavity is mildly dilated. Normal right ventricular function. Structurally normal trileaflet aortic valve.  Mild (Grade I) aortic regurgitation. Moderate to severe mitral regurgitation. Mild mitral valve leaflet calcification. Structurally normal tricuspid valve.  Severe tricuspid regurgitation. Moderate pulmonary hypertension. RVSP measures 53 mmHg. Structurally normal  pulmonic valve.  Mild pulmonic regurgitation. IVC is dilated with respiratory variation. No significant change compared to 08/2021.   EKG:  EKG 06/09/2023: Atrial fibrillation with controlled ventricular response at the rate of 67 bpm, normal axis, incomplete right bundle branch block.  Poor R progression, cannot exclude anteroseptal infarct old.  Compared to 02/03/2023, no significant change.  Medications and allergies   Allergies  Allergen Reactions   Penicillins Anaphylaxis, Swelling and Rash    Has patient had a PCN reaction causing immediate rash, facial/tongue/throat swelling, SOB or lightheadedness with hypotension: Yes Has patient had a PCN reaction causing severe rash involving mucus membranes or skin necrosis: Yes Has patient had a PCN reaction that required hospitalization No Has patient had a PCN reaction occurring within the last 10 years: No If all of the above answers are "NO", then may proceed with Cephalosporin use.    Hydrocodone Other (See Comments)    Makes very confused  Azithromycin     PT unsure of reaction    Codeine Other (See Comments)    UNSPECIFIED REACTION Patient was told not to take it.   Levaquin [Levofloxacin] Other (See Comments)    UNSPECIFIED REACTION Patient was told not to take it.   Sulfa Antibiotics Itching and Rash     Current Outpatient Medications:    acetaminophen (TYLENOL) 500 MG tablet, Take 1,000 mg by mouth every 6 (six) hours as needed for moderate pain., Disp: , Rfl:    allopurinol (ZYLOPRIM) 100 MG tablet, Take 100 mg by mouth 2 (two) times daily. , Disp: , Rfl: 3   atenolol (TENORMIN) 50 MG tablet, Take 50 mg by mouth 2 (two) times daily., Disp: , Rfl: 2   B Complex-C (B-COMPLEX WITH VITAMIN C) tablet, Take 1 tablet by mouth daily., Disp: 30 tablet, Rfl: 0   Biotin (BIOTIN 5000) 5 MG CAPS, Take 5 mg by mouth daily., Disp: , Rfl:    Cholecalciferol (VITAMIN D3) 50 MCG (2000 UT) CAPS, Take 2,000 Units by mouth daily., Disp: , Rfl:     ELIQUIS 5 MG TABS tablet, TAKE 1 TABLET BY MOUTH TWICE A DAY, Disp: 180 tablet, Rfl: 1   feeding supplement (ENSURE ENLIVE / ENSURE PLUS) LIQD, Take 237 mLs by mouth daily., Disp: 10000 mL, Rfl: 0   ferrous sulfate 325 (65 FE) MG tablet, Take 1 tablet (325 mg total) by mouth daily with breakfast., Disp: 30 tablet, Rfl: 0   loratadine (CLARITIN) 10 MG tablet, Take 10 mg by mouth daily., Disp: , Rfl:    Melatonin 10 MG TABS, Take 1 tablet by mouth daily., Disp: , Rfl:    rosuvastatin (CRESTOR) 5 MG tablet, Take 5 mg by mouth daily., Disp: , Rfl:    venlafaxine XR (EFFEXOR-XR) 37.5 MG 24 hr capsule, Take 37.5 mg by mouth daily., Disp: , Rfl:    vitamin B-12 (CYANOCOBALAMIN) 500 MCG tablet, Take 500 mcg by mouth daily., Disp: , Rfl:    Assessment     ICD-10-CM   1. Permanent atrial fibrillation (HCC)  I48.21 EKG 12-Lead    2. Moderate to severe mitral regurgitation  I34.0     3. Primary hypertension  I10     4. Bilateral leg edema  R60.0      CHA2DS2-VASc Score is 4.  Yearly risk of stroke: 4.8% (A, F, HTN).  Score of 1=0.6; 2=2.2; 3=3.2; 4=4.8; 5=7.2; 6=9.8; 7=>9.8) -(CHF; HTN; vasc disease DM,  Female = 1; Age <65 =0; 65-74 = 1,  >75 =2; stroke/embolism= 2).    No orders of the defined types were placed in this encounter.    Medications Discontinued During This Encounter  Medication Reason   oxyCODONE (ROXICODONE) 5 MG immediate release tablet    vitamin E 180 MG (400 UNITS) capsule Discontinued by provider    Recommendations:   Kathryn Ayala  is a 88 y.o.   Caucasian female with permanent atrial fibrillation, essential hypertension, hyperlipidemia, mild chronic dyspnea with a negative nuclear stress test in 2018, moderate to moderately severe MR and TR with mild pulmonary hypertension presents here for 48-month office visit.  1. Permanent atrial fibrillation (HCC) Patient is tolerating anticoagulation with Eliquis without bleeding diathesis. - EKG 12-Lead  2. Moderate to  severe mitral regurgitation No change in physical exam with regard to mitral valve regurgitation.  3. Primary hypertension Blood pressure is well-controlled.  4. Bilateral leg edema Patient made an appointment to see [place] due to leg edema and  she was told by nurse petitioner regarding possibility of early heart failure.  Interestingly her edema started after bilateral knee and hip replacement. Patient has endured 3 major operations.  In June 2023 she underwent left knee replacement and in December 2023 right knee replacement and then in February 2024 she had a fall and had right hip arthroplasty.  A competent of venous insufficiency may also be contributing.  She has purplish legs that changed color consistent with acrocyanosis.  Recent ABI was completely normal.  I do not suspect decompensated heart failure.  No change in physical exam, there is no JVD, lungs are clear and there is no change in her functional status.  However she does have an echocardiogram pending in a month or 2, I would like to see her back then.  If echocardiogram reveals worsening MR or worsening pulmonary hypertension, I would have a threshold to proceed with mitral valve repair.  Other orders - Melatonin 10 MG TABS; Take 1 tablet by mouth daily.   1. Permanent atrial fibrillation Patient with permanent atrial fibrillation, tolerating anticoagulation without bleeding diathesis.  She continues to remain active. - EKG 12-Lead  2. Moderate to severe mitral regurgitation Surprisingly in spite of moderate to severe mitral regurgitation, patient has endured 3 major operations.  In June 2023 she underwent left knee replacement and in December 2023 right knee replacement and then in February 2024 she had a fall and had right hip arthroplasty.  No clinical evidence of heart failure.  She is now 88 years of age.  She still continues to remain active and independent.  3. Primary hypertension Blood pressure is under excellent  control.  No changes in the medications were done today.  Renal function is normal.  External Labs reviewed.    Yates Decamp, MD, [place] 06/09/2023, 11:56 AM Office: [phone] Pager: [phone]

## 2023-06-23 DIAGNOSIS — M25551 Pain in right hip: Secondary | ICD-10-CM | POA: Diagnosis not present

## 2023-06-25 DIAGNOSIS — M25551 Pain in right hip: Secondary | ICD-10-CM | POA: Diagnosis not present

## 2023-06-29 DIAGNOSIS — M25551 Pain in right hip: Secondary | ICD-10-CM | POA: Diagnosis not present

## 2023-07-02 ENCOUNTER — Ambulatory Visit: Payer: Medicare PPO

## 2023-07-02 DIAGNOSIS — I4821 Permanent atrial fibrillation: Secondary | ICD-10-CM

## 2023-07-02 DIAGNOSIS — R600 Localized edema: Secondary | ICD-10-CM | POA: Diagnosis not present

## 2023-07-02 DIAGNOSIS — I340 Nonrheumatic mitral (valve) insufficiency: Secondary | ICD-10-CM

## 2023-07-17 NOTE — Progress Notes (Signed)
She is active on mychart and I have messaged her. Very stable MR and TR. Although severe, clinically very stable also. So will continue to observe for now  Echocardiogram 07/02/2023:  Left ventricle cavity is normal in size. Mild concentric hypertrophy of the left ventricle. Normal global wall motion. Abnormal septal wall motion due to right ventricular volume and pressure  overload. Indeterminate diastolic filling pattern. Normal LV systolic function with visual EF 55-60%. Calculated EF 53%. Left atrial cavity is severely dilated at 96.9 ml/m^2. Right atrial cavity is severely dilated. Right ventricle cavity is mildly dilated. Normal right ventricular function. Structurally normal trileaflet aortic valve. No evidence of aortic stenosis. Mild (Grade I) aortic regurgitation. Native mitral valve. Moderate (Grade II) mitral regurgitation. Mild prolapse of the mitral valve leaflets. Structurally normal tricuspid valve. Severe tricuspid regurgitation. Moderate pulmonary hypertension. RVSP measures 53 mmHg. Structurally normal pulmonic valve. Mild to moderate pulmonic regurgitation. IVC is dilated with poor inspiration collapse consistent with elevated right atrial pressure. No significant change since 07/24/2022 and 10/30/2020.

## 2023-07-22 DIAGNOSIS — Z1231 Encounter for screening mammogram for malignant neoplasm of breast: Secondary | ICD-10-CM | POA: Diagnosis not present

## 2023-07-24 DIAGNOSIS — M7061 Trochanteric bursitis, right hip: Secondary | ICD-10-CM | POA: Diagnosis not present

## 2023-07-30 DIAGNOSIS — M19011 Primary osteoarthritis, right shoulder: Secondary | ICD-10-CM | POA: Diagnosis not present

## 2023-07-30 DIAGNOSIS — M19111 Post-traumatic osteoarthritis, right shoulder: Secondary | ICD-10-CM | POA: Diagnosis not present

## 2023-07-31 ENCOUNTER — Telehealth: Payer: Self-pay

## 2023-07-31 NOTE — Telephone Encounter (Signed)
...  Pre-operative Risk Assessment    Patient Name: Kathryn Ayala  DOB: 1935/03/21 MRN: [id number]      Request for Surgical Clearance    Procedure:   RT REVERSE TOTAL SHOULDER ARTHROPLASTY  Date of Surgery:  Clearance TBD                                 Surgeon:  DR Malon Kindle Surgeon's Group or Practice Name:  [facility] Phone number:  [phone] Fax number:  [phone]   Type of Clearance Requested:   - Medical  - Pharmacy:  Hold Apixaban (Eliquis)     Type of Anesthesia:   CHOICE CH  Additional requests/questions:   LAST O/V 06/09/23, NO FOLLW UP APPT  Signed, Renee Ramus   07/31/2023, 2:52 PM

## 2023-08-01 DIAGNOSIS — M12819 Other specific arthropathies, not elsewhere classified, unspecified shoulder: Secondary | ICD-10-CM | POA: Insufficient documentation

## 2023-08-03 NOTE — Telephone Encounter (Signed)
Patient with diagnosis of afib on Eliquis for anticoagulation.    Procedure:   RT REVERSE TOTAL SHOULDER ARTHROPLASTY  Date of procedure: TBD   CHA2DS2-VASc Score = 6   This indicates a 9.7% annual risk of stroke. 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    **This guidance is not considered finalized until pre-operative APP has relayed final recommendations.**

## 2023-08-05 ENCOUNTER — Ambulatory Visit: Payer: Medicare PPO | Admitting: Cardiology

## 2023-08-05 NOTE — Telephone Encounter (Signed)
I s/w the pt and she has been scheduled in office per pt request, as well as pt thought she had appt 08/10/23 with Dr. Jacinto Halim. I explained to her that Dr. Jacinto Halim office cancelled the appt and left message for her to call back to reschedule; while they were still down at the old building.   Pt in regard to surgery said she will not be having surgery until about the first of the yr. Pt wants to be seen in office and has been scheduled to see Dr. Jacinto Halim 09/23/23 @ 10 am. I did offer 09/16/23 appt but pt said she will be out of town.   I will update all parties involved.

## 2023-08-05 NOTE — Telephone Encounter (Signed)
09/23/23 appointment is fine. She has been doing well

## 2023-08-05 NOTE — Telephone Encounter (Signed)
Name: Kathryn Ayala  DOB: 02-06-1935  MRN: [id number]  Primary Cardiologist: None  Chart reviewed as part of pre-operative protocol coverage. Because of [name]'s past medical history and time since last visit, she will require a follow-up telephone visit in order to better assess preoperative cardiovascular risk.  Pre-op covering staff: - Please schedule appointment and call patient to inform them. If patient already had an upcoming appointment within acceptable timeframe, please add "pre-op clearance" to the appointment notes so provider is aware. - Please contact requesting surgeon's office via preferred method (i.e, phone, fax) to inform them of need for appointment prior to surgery.  Per office protocol, patient can hold Eliquis for 3 days prior to procedure.  Please resume when medically safe to do so.  Sharlene Dory, PA-C  08/05/2023, 12:49 PM

## 2023-08-10 ENCOUNTER — Ambulatory Visit: Payer: Self-pay | Admitting: Cardiology

## 2023-09-09 ENCOUNTER — Other Ambulatory Visit: Payer: Self-pay | Admitting: Cardiology

## 2023-09-09 ENCOUNTER — Telehealth: Payer: Self-pay | Admitting: Cardiology

## 2023-09-09 DIAGNOSIS — I10 Essential (primary) hypertension: Secondary | ICD-10-CM

## 2023-09-22 DIAGNOSIS — M8000XA Age-related osteoporosis with current pathological fracture, unspecified site, initial encounter for fracture: Secondary | ICD-10-CM | POA: Diagnosis not present

## 2023-09-22 DIAGNOSIS — D6869 Other thrombophilia: Secondary | ICD-10-CM | POA: Diagnosis not present

## 2023-09-22 DIAGNOSIS — N1831 Chronic kidney disease, stage 3a: Secondary | ICD-10-CM | POA: Diagnosis not present

## 2023-09-22 DIAGNOSIS — E785 Hyperlipidemia, unspecified: Secondary | ICD-10-CM | POA: Diagnosis not present

## 2023-09-22 DIAGNOSIS — I480 Paroxysmal atrial fibrillation: Secondary | ICD-10-CM | POA: Diagnosis not present

## 2023-09-22 DIAGNOSIS — R7301 Impaired fasting glucose: Secondary | ICD-10-CM | POA: Diagnosis not present

## 2023-09-22 DIAGNOSIS — I129 Hypertensive chronic kidney disease with stage 1 through stage 4 chronic kidney disease, or unspecified chronic kidney disease: Secondary | ICD-10-CM | POA: Diagnosis not present

## 2023-09-22 DIAGNOSIS — M353 Polymyalgia rheumatica: Secondary | ICD-10-CM | POA: Diagnosis not present

## 2023-09-22 DIAGNOSIS — Z23 Encounter for immunization: Secondary | ICD-10-CM | POA: Diagnosis not present

## 2023-09-23 ENCOUNTER — Encounter: Payer: Self-pay | Admitting: Cardiology

## 2023-09-23 ENCOUNTER — Ambulatory Visit: Payer: Medicare PPO | Attending: Cardiology | Admitting: Cardiology

## 2023-09-23 VITALS — BP 130/72 | HR 71 | Resp 16 | Ht 63.0 in | Wt 152.6 lb

## 2023-09-23 DIAGNOSIS — Z01810 Encounter for preprocedural cardiovascular examination: Secondary | ICD-10-CM

## 2023-09-23 DIAGNOSIS — I10 Essential (primary) hypertension: Secondary | ICD-10-CM

## 2023-09-23 DIAGNOSIS — I340 Nonrheumatic mitral (valve) insufficiency: Secondary | ICD-10-CM | POA: Diagnosis not present

## 2023-09-23 DIAGNOSIS — I4821 Permanent atrial fibrillation: Secondary | ICD-10-CM | POA: Diagnosis not present

## 2023-09-23 DIAGNOSIS — I071 Rheumatic tricuspid insufficiency: Secondary | ICD-10-CM | POA: Diagnosis not present

## 2023-09-23 MED ORDER — APIXABAN 2.5 MG PO TABS: 2.5000 mg | ORAL_TABLET | Freq: Two times a day (BID) | ORAL | Status: DC

## 2023-09-23 NOTE — Patient Instructions (Addendum)
Medication Instructions:  Your physician has recommended you make the following change in your medication: Decrease Eliquis to 2.5 mg by mouth twice daily   *If you need a refill on your cardiac medications before your next appointment, please call your pharmacy*   Lab Work: none If you have labs (blood work) drawn today and your tests are completely normal, you will receive your results only by: MyChart Message (if you have MyChart) OR A paper copy in the mail If you have any lab test that is abnormal or we need to change your treatment, we will call you to review the results.   Testing/Procedures: Your physician has requested that you have an echocardiogram. To be done in one year. Echocardiography is a painless test that uses sound waves to create images of your heart. It provides your doctor with information about the size and shape of your heart and how well your heart's chambers and valves are working. This procedure takes approximately one hour. There are no restrictions for this procedure. Please do NOT wear cologne, perfume, aftershave, or lotions (deodorant is allowed). Please arrive 15 minutes prior to your appointment time.  Please note: We ask at that you not bring children with you during ultrasound (echo/ vascular) testing. Due to room size and safety concerns, children are not allowed in the ultrasound rooms during exams. Our front office staff cannot provide observation of children in our lobby area while testing is being conducted. An adult accompanying a patient to their appointment will only be allowed in the ultrasound room at the discretion of the ultrasound technician under special circumstances. We apologize for any inconvenience.    Follow-Up: At [facility], you and your health needs are our priority.  As part of our continuing mission to provide you with exceptional heart care, we have created designated Provider Care Teams.  These Care Teams include your  primary Cardiologist (physician) and Advanced Practice Providers (APPs -  Physician Assistants and Nurse Practitioners) who all work together to provide you with the care you need, when you need it.  We recommend signing up for the patient portal called "MyChart".  Sign up information is provided on this After Visit Summary.  MyChart is used to connect with patients for Virtual Visits (Telemedicine).  Patients are able to view lab/test results, encounter notes, upcoming appointments, etc.  Non-urgent messages can be sent to your provider as well.   To learn more about what you can do with MyChart, go to [email or web address].    Your next appointment:   12 month(s)  Provider:   Yates Decamp, MD     Other Instructions

## 2023-09-23 NOTE — Progress Notes (Signed)
Cardiology Office Note:  .   Date:  09/23/2023  ID:  Kathryn Ayala, DOB 1935-09-02, MRN [id number] PCP: Rodrigo Ran, MD  [facility] Providers Cardiologist:  Yates Decamp, MD   History of Present Illness: .   Kathryn Ayala is a 88 y.o. Caucasian female with permanent atrial fibrillation, essential hypertension, hyperlipidemia, mild chronic dyspnea with a negative nuclear stress test in 2018, moderate to moderately severe MR and TR with mild pulmonary hypertension presents here for 32-month office visit.  She has continued to remain active and denies any dyspnea or chest pain or palpitations.  She lives independently.  No PND or orthopnea.  Surprisingly in spite of moderate to severe mitral regurgitation, patient has endured 3 major operations.  In June 2023 she underwent left knee replacement and in December 2023 right knee replacement and then in February 2024 she had a fall and had right hip arthroplasty.   Discussed the use of AI scribe software for clinical note transcription with the patient, who gave verbal consent to proceed.  History of Present Illness   The patient, with a history of hypertension, hyperlipidemia, and atrial fibrillation, presents for a preoperative evaluation for right shoulder surgery. She reports that the surgery has been delayed due to a lack of communication between the surgeon and the cardiologist. The patient is eager to proceed with the surgery as she has been experiencing significant discomfort in the right shoulder.  The patient's other medical conditions appear to be well controlled. She is currently taking atenolol, rosuvastatin, and Eliquis. The patient reports no recent changes in weight, and her most recent blood work was reported as Brewing technologist" by her primary care physician.  The patient has a history of multiple joint replacements, including left knee arthroplasty in June 2023, right knee arthroplasty in December 2023, and right hip  arthroplasty in February 2024. Despite severe valvular insufficiency, the patient remains active and has not had any decompensated heart failure or hospital admissions and is eager to continue with her daily activities without the discomfort of her shoulder pain.   Review of Systems  Cardiovascular:  Positive for leg swelling (stable). Negative for chest pain and dyspnea on exertion.    Labs   Lab Results  Component Value Date   NA 131 (L) 01/04/2023   K 3.1 (L) 01/04/2023   CO2 22 01/04/2023   GLUCOSE 148 (H) 01/04/2023   BUN 21 01/04/2023   CREATININE 0.70 01/04/2023   CALCIUM 10.2 01/04/2023   GFRNONAA >60 01/04/2023      Latest Ref Rng & Units 01/04/2023    8:42 PM 11/11/2022    3:56 AM 11/10/2022    3:47 AM  BMP  Glucose 70 - 99 mg/dL 160  109  323   BUN 8 - 23 mg/dL 21  36  23   Creatinine 0.44 - 1.00 mg/dL 5.57  3.22  0.25   Sodium 135 - 145 mmol/L 131  130  133   Potassium 3.5 - 5.1 mmol/L 3.1  4.1  3.8   Chloride 98 - 111 mmol/L 98  104  103   CO2 22 - 32 mmol/L 22  19  25    Calcium 8.9 - 10.3 mg/dL 42.7  8.9  9.1       Latest Ref Rng & Units 01/04/2023    8:42 PM 11/11/2022    3:56 AM 11/10/2022   10:36 AM  CBC  WBC 4.0 - 10.5 K/uL 12.1  11.0  13.2   Hemoglobin  12.0 - 15.0 g/dL 81.1  8.0  8.7   Hematocrit 36.0 - 46.0 % 36.6  24.5  27.6   Platelets 150 - 400 K/uL 269  169  174     External Labs:  Labs 09/22/2023:  Serum glucose 92 mg, BUN 20, creatinine 0.6, EGFR 94 mL, potassium 4.2, LFTs normal.  Hb 12.8/HCT 38.7, platelets 302, normal indicis.  A1c 5.6%.  Physical Exam:   VS:  BP 130/72 (BP Location: Left Arm, Patient Position: Sitting, Cuff Size: Normal)   Pulse 71   Resp 16   Ht 5\' 3"  (1.6 m)   Wt 152 lb 9.6 oz (69.2 kg)   SpO2 98%   BMI 27.03 kg/m    Wt Readings from Last 3 Encounters:  09/23/23 152 lb 9.6 oz (69.2 kg)  06/09/23 149 lb (67.6 kg)  02/03/23 146 lb 9.6 oz (66.5 kg)     Physical Exam Neck:     Vascular: No carotid bruit or  JVD.  Cardiovascular:     Rate and Rhythm: Normal rate. Rhythm irregular.     Pulses: Normal pulses and intact distal pulses.     Heart sounds: Murmur heard.     Midsystolic murmur is present with a grade of 2/6 at the upper right sternal border.  Pulmonary:     Effort: Pulmonary effort is normal.     Breath sounds: Normal breath sounds.  Abdominal:     General: Bowel sounds are normal.     Palpations: Abdomen is soft.  Musculoskeletal:     Right lower leg: No edema.     Left lower leg: Edema (1-2+ pitting) present.  Skin:    Capillary Refill: Capillary refill takes less than 2 seconds.     Studies Reviewed: .    Echocardiogram 07/02/2023: Left ventricle cavity is normal in size. Mild concentric hypertrophy of the left ventricle. Normal global wall motion. Abnormal septal wall motion due to right ventricular volume and pressure  overload. Indeterminate diastolic filling pattern. Normal LV systolic function with visual EF 55-60%. Calculated EF 53%. Left atrial cavity is severely dilated at 96.9 ml/m^2. Right atrial cavity is severely dilated. Right ventricle cavity is mildly dilated. Normal right ventricular function. Structurally normal trileaflet aortic valve. No evidence of aortic stenosis. Mild (Grade I) aortic regurgitation. Native mitral valve. Moderate (Grade II) mitral regurgitation. Mild prolapse of the mitral valve leaflets. Structurally normal tricuspid valve. Severe tricuspid regurgitation. Moderate pulmonary hypertension. RVSP measures 53 mmHg. Structurally normal pulmonic valve. Mild to moderate pulmonic regurgitation. IVC is dilated with poor inspiration collapse consistent with elevated right atrial pressure. No significant change since 07/24/2022 and 10/30/2020.  EKG:    EKG Interpretation Date/Time:  Wednesday September 23 2023 10:26:19 EST Ventricular Rate:  75 PR Interval:    QRS Duration:  72 QT Interval:  396 QTC Calculation: 442 R Axis:   12  Text  Interpretation: EKG 09/23/2023: Atrial fibrillation with controlled ventricular response at the rate of 75 bpm, normal axis, incomplete right bundle branch block. Poor R progression, cannot exclude anteroseptal infarct old.  Nonspecific T abnormality.  Compared to 01/04/2023, lateral ST depression suggestive of LVH with strain vs ischemia not present. Confirmed by Delrae Rend [phone]) on 09/23/2023 10:28:11 AM    EKG 06/09/2023: Atrial fibrillation with controlled ventricular response at the rate of 67 bpm, normal axis, incomplete right bundle branch block. Poor R progression, cannot exclude anteroseptal infarct old.  Medications and allergies    Allergies  Allergen Reactions   Penicillins  Anaphylaxis, Swelling and Rash    Has patient had a PCN reaction causing immediate rash, facial/tongue/throat swelling, SOB or lightheadedness with hypotension: Yes Has patient had a PCN reaction causing severe rash involving mucus membranes or skin necrosis: Yes Has patient had a PCN reaction that required hospitalization No Has patient had a PCN reaction occurring within the last 10 years: No If all of the above answers are "NO", then may proceed with Cephalosporin use.    Hydrocodone Other (See Comments)    Makes very confused   Azithromycin     PT unsure of reaction    Codeine Other (See Comments)    UNSPECIFIED REACTION Patient was told not to take it.   Levaquin [Levofloxacin] Other (See Comments)    UNSPECIFIED REACTION Patient was told not to take it.   Sulfa Antibiotics Itching and Rash     Current Outpatient Medications:    acetaminophen (TYLENOL) 500 MG tablet, Take 1,000 mg by mouth every 6 (six) hours as needed for moderate pain., Disp: , Rfl:    allopurinol (ZYLOPRIM) 100 MG tablet, Take 100 mg by mouth 2 (two) times daily. , Disp: , Rfl: 3   atenolol (TENORMIN) 50 MG tablet, Take 50 mg by mouth 2 (two) times daily., Disp: , Rfl: 2   B Complex-C (B-COMPLEX WITH VITAMIN C) tablet, Take  1 tablet by mouth daily., Disp: 30 tablet, Rfl: 0   Biotin (BIOTIN 5000) 5 MG CAPS, Take 5 mg by mouth daily., Disp: , Rfl:    Cholecalciferol (VITAMIN D3) 50 MCG (2000 UT) CAPS, Take 2,000 Units by mouth daily., Disp: , Rfl:    feeding supplement (ENSURE ENLIVE / ENSURE PLUS) LIQD, Take 237 mLs by mouth daily., Disp: 10000 mL, Rfl: 0   ferrous sulfate 325 (65 FE) MG tablet, Take 1 tablet (325 mg total) by mouth daily with breakfast., Disp: 30 tablet, Rfl: 0   loratadine (CLARITIN) 10 MG tablet, Take 10 mg by mouth daily., Disp: , Rfl:    Melatonin 10 MG TABS, Take 1 tablet by mouth daily., Disp: , Rfl:    rosuvastatin (CRESTOR) 5 MG tablet, Take 5 mg by mouth daily., Disp: , Rfl:    venlafaxine XR (EFFEXOR-XR) 37.5 MG 24 hr capsule, Take 37.5 mg by mouth daily., Disp: , Rfl:    vitamin B-12 (CYANOCOBALAMIN) 500 MCG tablet, Take 500 mcg by mouth daily., Disp: , Rfl:    apixaban (ELIQUIS) 2.5 MG TABS tablet, Take 1 tablet (2.5 mg total) by mouth 2 (two) times daily., Disp: , Rfl:    ASSESSMENT AND PLAN: .      ICD-10-CM   1. Moderate to severe mitral regurgitation  I34.0 ECHOCARDIOGRAM COMPLETE    2. Preop cardiovascular exam  Z01.810     3. Permanent atrial fibrillation (HCC)  I48.21 EKG 12-Lead    apixaban (ELIQUIS) 2.5 MG TABS tablet    4. Severe tricuspid regurgitation  I07.1 ECHOCARDIOGRAM COMPLETE    5. Primary hypertension  I10      CHA2DS2-VASc Score is 4. Yearly risk of stroke: 4.8% (A, F, HTN).   Assessment and Plan    Right Shoulder Arthroplasty Pending surgery with Dr. Beverely Low. No preoperative cardiac testing required given stable cardiac status and recent successful surgeries (left knee arthroplasty in June 2023, right knee arthroplasty in December 2023, right hip arthroplasty in February 2024). -Send preoperative clearance letter to Dr. Ranell Patrick today.  Atrial Fibrillation on Eliquis Stable, with good control of heart rate on Atenolol. Considering reducing  Eliquis  dose due to patient's weight and age. -Reduce Eliquis dose to 2.5mg  daily. Patient to halve current 5mg  tablets.  Mitral and Tricuspid Regurgitation Stable since 2019, with no signs of heart failure. -Continue current management.  Hypertension Well controlled on Atenolol 50mg  twice daily. -Continue current management.  Hyperlipidemia Well controlled on Rosuvastatin. -Continue current management.  Follow-up in 1 year.   Signed,  Yates Decamp, MD, [facility] 09/23/2023, 1:19 PM [facility] [address] [place], [place] [address] Phone: [phone]. Fax:  [phone]

## 2023-10-01 NOTE — H&P (Signed)
Patient's anticipated LOS is less than 2 midnights, meeting these requirements: - Younger than 45 - Lives within 1 hour of care - Has a competent adult at home to recover with post-op recover - NO history of  - Chronic pain requiring opiods  - Diabetes  - Coronary Artery Disease  - Heart failure  - Heart attack  - Stroke  - DVT/VTE  - Cardiac arrhythmia  - Respiratory Failure/COPD  - Renal failure  - Anemia  - Advanced Liver disease     Kathryn Ayala is an 88 y.o. female.    Chief Complaint: right shoulder pain  HPI: Pt is a 88 y.o. female complaining of right shoulder pain for multiple years. Pain had continually increased since the beginning. X-rays in the clinic show end-stage arthritic changes of the right shoulder. Pt has tried various conservative treatments which have failed to alleviate their symptoms, including injections and therapy. Various options are discussed with the patient. Risks, benefits and expectations were discussed with the patient. Patient understand the risks, benefits and expectations and wishes to proceed with surgery.   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   Tachycardia    Urinary frequency     PSH: Past Surgical History:  Procedure Laterality Date   ABDOMINAL HYSTERECTOMY  1996   APPENDECTOMY  1956   BREAST BIOPSY Left 05/07/2016   BREAST BIOPSY Bilateral 1956, 1980   BREAST LUMPECTOMY WITH RADIOACTIVE SEED LOCALIZATION Left 06/16/2016   Procedure: LEFT BREAST LUMPECTOMY WITH RADIOACTIVE SEED LOCALIZATION;  Surgeon: Chevis Pretty III, MD;  Location: [facility]  OR;  Service: General;  Laterality: Left;   COLONOSCOPY     KIDNEY STONE SURGERY     KNEE SURGERY  11/20/2004   TOTAL HIP ARTHROPLASTY Left 11/09/2022   Procedure: TOTAL HIP ARTHROPLASTY ANTERIOR APPROACH;  Surgeon: Durene Romans, MD;  Location: [facility] ORS;  Service: Orthopedics;  Laterality: Left;   TOTAL KNEE ARTHROPLASTY Right 04/07/2022   Procedure: TOTAL KNEE ARTHROPLASTY;  Surgeon: Ollen Gross, MD;  Location: [facility] ORS;  Service: Orthopedics;  Laterality: Right;   TOTAL KNEE ARTHROPLASTY Left 09/22/2022   Procedure: TOTAL KNEE ARTHROPLASTY;  Surgeon: Ollen Gross, MD;  Location: [facility] ORS;  Service: Orthopedics;  Laterality: Left;   WRIST SURGERY Left 2018    Social History:  reports that she has never smoked. She has never been exposed to tobacco smoke. She has never used smokeless tobacco. She reports that she does not drink alcohol and does not use drugs. BMI: Estimated body mass index is 27.03 kg/m as calculated from the following:   Height as of 09/23/23: 5\' 3"  (1.6 m).   Weight as of 09/23/23: 69.2 kg.  No results found for: "ALBUMIN" Diabetes: Patient does not have a diagnosis of diabetes.     Smoking Status:   reports that she has never smoked. She has never been exposed to tobacco smoke. She has never used smokeless tobacco.    Allergies:  Allergies  Allergen  Reactions   Penicillins Anaphylaxis, Swelling and Rash    Has patient had a PCN reaction causing immediate rash, facial/tongue/throat swelling, SOB or lightheadedness with hypotension: Yes Has patient had a PCN reaction causing severe rash involving mucus membranes or skin necrosis: Yes Has patient had a PCN reaction that required hospitalization No Has patient had a PCN reaction occurring within the last 10 years: No If all of the above answers are "NO", then may proceed with Cephalosporin use.    Hydrocodone Other (See Comments)    Makes very confused   Azithromycin     PT unsure of reaction    Codeine Other  (See Comments)    UNSPECIFIED REACTION Patient was told not to take it.   Levaquin [Levofloxacin] Other (See Comments)    UNSPECIFIED REACTION Patient was told not to take it.   Sulfa Antibiotics Itching and Rash    Medications: No current facility-administered medications for this encounter.   Current Outpatient Medications  Medication Sig Dispense Refill   acetaminophen (TYLENOL) 500 MG tablet Take 1,000 mg by mouth every 6 (six) hours as needed for moderate pain.     allopurinol (ZYLOPRIM) 100 MG tablet Take 100 mg by mouth 2 (two) times daily.   3   apixaban (ELIQUIS) 2.5 MG TABS tablet Take 1 tablet (2.5 mg total) by mouth 2 (two) times daily.     atenolol (TENORMIN) 50 MG tablet Take 50 mg by mouth 2 (two) times daily.  2   B Complex-C (B-COMPLEX WITH VITAMIN C) tablet Take 1 tablet by mouth daily. 30 tablet 0   Biotin (BIOTIN 5000) 5 MG CAPS Take 5 mg by mouth daily.     Cholecalciferol (VITAMIN D3) 50 MCG (2000 UT) CAPS Take 2,000 Units by mouth daily.     feeding supplement (ENSURE ENLIVE / ENSURE PLUS) LIQD Take 237 mLs by mouth daily. 10000 mL 0   ferrous sulfate 325 (65 FE) MG tablet Take 1 tablet (325 mg total) by mouth daily with breakfast. 30 tablet 0   loratadine (CLARITIN) 10 MG tablet Take 10 mg by mouth daily.     Melatonin 10 MG TABS Take 1 tablet by mouth daily.     rosuvastatin (CRESTOR) 5 MG tablet Take 5 mg by mouth daily.     venlafaxine XR (EFFEXOR-XR) 37.5 MG 24 hr capsule Take 37.5 mg by mouth daily.     vitamin B-12 (CYANOCOBALAMIN) 500 MCG tablet Take 500 mcg by mouth daily.      No results found for this or any previous visit (from the past 48 hours). No results found.  ROS: Pain with rom of the right upper extremity  Physical Exam: Alert and oriented 88 y.o. female in no acute distress Cranial nerves 2-12 intact Cervical spine: full rom with no tenderness, nv intact distally Chest: active breath sounds bilaterally, no wheeze rhonchi or  rales Heart: regular rate and rhythm, no murmur Abd: non tender non distended with active bowel sounds Hip is stable with rom  Right shoulder painful/weak rom Nv intact distally No rashes or edema distally  Assessment/Plan Assessment: right shoulder cuff arthropathy  Plan:  Patient will undergo a right reverse total shoulder by Dr. Ranell Patrick at [place] Risks benefits and expectations were discussed with the patient. Patient understand risks, benefits and expectations and wishes to proceed. Preoperative templating of the joint replacement has been completed, documented, and submitted to the Operating Room personnel in order to optimize intra-operative equipment management.   Brad Yigit Norkus PA-C, MPAS  [facility] is now [employer] [address], [place], [place] [address] Phone: [phone] [email or web address] Facebook  [employer]

## 2023-10-15 NOTE — Progress Notes (Addendum)
COVID Vaccine received:  []  No [x]  Yes Date of any COVID positive Test in last 29 days:None  PCP - Rodrigo Ran, MD  Cardiologist - Yates Decamp, MD   Chest x-ray - 01-04-2023  1v  Epic EKG - 09-23-2023  Epic  Stress Test - 2018  Epic ECHO - 07-02-2023  Epic Cardiac Cath -   PCR screen: [x]  Ordered & Completed []   No Order but Needs PROFEND     []   N/A for this surgery  Surgery Plan:  [x]  Ambulatory   []  Outpatient in bed  []  Admit Anesthesia:    []  General  []  Spinal  []   Choice []   MAC  Pacemaker / ICD device [x]  No []  Yes   Spinal Cord Stimulator:[x]  No []  Yes       History of Sleep Apnea? [x]  No []  Yes   CPAP used?- [x]  No []  Yes    Does the patient monitor blood sugar?   []  N/A   [x]  No []  Yes  Patient has: []  NO Hx DM   [x]  Pre-DM   []  DM1  []   DM2 Last A1c was: 5.6 on 09-23-2023       Blood Thinner / Instructions:  Eliquis  Hold x 2 days  ok'd by Dr. Jacinto Halim  patient is aware Aspirin Instructions:  None  ERAS Protocol Ordered: []  No  [x]  Yes PRE-SURGERY []  ENSURE  [x]  G2  Patient is to be NPO after: 09:15  Dental hx: []  Dentures:  [x]  N/A      []  Bridge or Partial:                   []  Loose or Damaged teeth:   Comments: Patient was given the 5 CHG shower / bath instructions for Reverse Shoulder arthroplasty surgery along with 2 bottles of the CHG soap. Patient will start this on: Monday  10-19-2023  All questions were asked and answered, Patient voiced understanding of this process.   The patient was given Benzoyl peroxide Gel as ordered. Instruction regarding application starting 2 days prior to surgery was given and patient voiced understanding.   Activity level: Patient is able to climb a flight of stairs without difficulty; [x]  No CP   but would have SOB  Patient can perform ADLs without assistance.   Anesthesia review: A.fib, HTN, Gout, depression, Hx CVA- deaf right ear, Pre-DM- no meds, CKD2 , anemia, "sometimes get rapid HB after anesthesia, General anesthesia-  patient doesn't respond well for several days after surgery"   s/p left breast lumpectomy for cancer,   Patient denies shortness of breath, fever, cough and chest pain at PAT appointment.  Patient verbalized understanding and agreement to the Pre-Surgical Instructions that were given to them at this PAT appointment. Patient was also educated of the need to review these PAT instructions again prior to her surgery.I reviewed the appropriate phone numbers to call if they have any and questions or concerns.

## 2023-10-15 NOTE — Patient Instructions (Addendum)
SURGICAL WAITING ROOM VISITATION Patients having surgery or a procedure may have no more than 2 support people in the waiting area - these visitors may rotate in the visitor waiting room.   Due to an increase in RSV and influenza rates and associated hospitalizations, children ages 19 and under may not visit patients in [facility] hospitals. If the patient needs to stay at the hospital during part of their recovery, the visitor guidelines for inpatient rooms apply.  PRE-OP VISITATION  Pre-op nurse will coordinate an appropriate time for 1 support person to accompany the patient in pre-op.  This support person may not rotate.  This visitor will be contacted when the time is appropriate for the visitor to come back in the pre-op area.  Please refer to the [facility] website for the visitor guidelines for Inpatients (after your surgery is over and you are in a regular room).  You are not required to quarantine at this time prior to your surgery. However, you must do this: Hand Hygiene often Do NOT share personal items Notify your provider if you are in close contact with someone who has COVID or you develop fever 100.4 or greater, new onset of sneezing, cough, sore throat, shortness of breath or body aches.  If you test positive for Covid or have been in contact with anyone that has tested positive in the last 10 days please notify you surgeon.    Your procedure is scheduled on:    FRIDAY  October 23, 2023  Report to [facility] Main Entrance: [facility] entrance where the [employer] is available.   Report to admitting at: 09:45    AM  Call this number if you have any questions or problems the morning of surgery [phone]  Do not eat food after Midnight the night prior to your surgery/procedure.  After Midnight you may have the following liquids until   09:15 AM DAY OF SURGERY  Clear Liquid Diet Water Black Coffee (sugar ok, NO MILK/CREAM OR CREAMERS)  Tea (sugar ok, NO  MILK/CREAM OR CREAMERS) regular and decaf                             Plain Jell-O  with no fruit (NO RED)                                           Fruit ices (not with fruit pulp, NO RED)                                     Popsicles (NO RED)                                                                  Juice: NO CITRUS JUICES: only apple, WHITE grape, WHITE cranberry Sports drinks like Gatorade or Powerade (NO RED)                    The day of surgery:  Drink ONE (1) Pre-Surgery G2 at 09:15  AM the morning  of surgery. Drink in one sitting. Do not sip.  This drink was given to you during your hospital pre-op appointment visit. Nothing else to drink after completing the Pre-Surgery  G2 : No candy, chewing gum or throat lozenges.    FOLLOW ANY ADDITIONAL PRE OP INSTRUCTIONS YOU RECEIVED FROM YOUR SURGEON'S OFFICE!!!   Oral Hygiene is also important to reduce your risk of infection.        Remember - BRUSH YOUR TEETH THE MORNING OF SURGERY WITH YOUR REGULAR TOOTHPASTE  Do NOT smoke after Midnight the night before surgery.  STOP TAKING all Vitamins, Herbs and supplements 1 week before your  surgery.   ELIQUIS-  Stop taking 2 days before your surgery. Last dose will be taken on Tuesday 10-20-2023  Take ONLY these medicines the morning of surgery with A SIP OF WATER: venlafaxine (Effexor), loratadine, atenolol, allopurinol and Tylenol if needed for pain.                   You may not have any metal on your body including hair pins, jewelry, and body piercing  Do not wear make-up, lotions, powders, perfumes or deodorant  Do not wear nail polish including gel and S&S, artificial / acrylic nails, or any other type of covering on natural nails including finger and toenails. If you have artificial nails, gel coating, etc., that needs to be removed by a nail salon, Please have this removed prior to surgery. Not doing so may mean that your surgery could be cancelled or delayed if the  Surgeon or anesthesia staff feels like they are unable to monitor you safely.   Do not shave 48 hours prior to surgery to avoid nicks in your skin which may contribute to postoperative infections.   Contacts, Hearing Aids, dentures or bridgework may not be worn into surgery. DENTURES WILL BE REMOVED PRIOR TO SURGERY PLEASE DO NOT APPLY "Poly grip" OR ADHESIVES!!!  Patients discharged on the day of surgery will not be allowed to drive home.  Someone NEEDS to stay with you for the first 24 hours after anesthesia.  Do not bring your home medications to the hospital. The Pharmacy will dispense medications listed on your medication list to you during your admission in the Hospital.  Special Instructions: Bring a copy of your healthcare power of attorney and living will documents the day of surgery, if you wish to have them scanned into your [facility] Records- EPIC  Please read over the following fact sheets you were given: IF YOU HAVE QUESTIONS ABOUT YOUR PRE-OP INSTRUCTIONS, PLEASE CALL [phone].     Pre-operative 5 CHG Bath Instructions   You can play a key role in reducing the risk of infection after surgery. Your skin needs to be as free of germs as possible. You can reduce the number of germs on your skin by washing with CHG (chlorhexidine gluconate) soap before surgery. CHG is an antiseptic soap that kills germs and continues to kill germs even after washing.   DO NOT use if you have an allergy to chlorhexidine/CHG or antibacterial soaps. If your skin becomes reddened or irritated, stop using the CHG and notify one of our RNs at [phone]  Please shower with the CHG soap starting 4 days before surgery using the following schedule: START SHOWERS ON   MONDAY  October 19, 2023  Please keep in mind the  following:  DO NOT shave, including legs and underarms, starting the day of your first shower.   You may shave your face at any point before/day of surgery.   Place clean sheets on your bed the day you start using CHG soap. Use a clean washcloth (not used since being washed) for each shower. DO NOT sleep with pets once you start using the CHG.   CHG Shower Instructions:  If you choose to wash your hair and private area, wash first with your normal shampoo/soap.  After you use shampoo/soap, rinse your hair and body thoroughly to remove shampoo/soap residue.  Turn the water OFF and apply about 3 tablespoons (45 ml) of CHG soap to a CLEAN washcloth.  Apply CHG soap ONLY FROM YOUR NECK DOWN TO YOUR TOES (washing for 3-5 minutes)  DO NOT use CHG soap on face, private areas, open wounds, or sores.  Pay special attention to the area where your surgery is being performed.  If you are having back surgery, having someone wash your back for you may be helpful.  Wait 2 minutes after CHG soap is applied, then you may rinse off the CHG soap.  Pat dry with a clean towel  Put on clean clothes/pajamas   If you choose to wear lotion, please use ONLY the CHG-compatible lotions on the back of this paper.     Additional instructions for the day of surgery: DO NOT APPLY any lotions, deodorants, cologne, or perfumes.   Put on clean/comfortable clothes.  Brush your teeth.  Ask your nurse before applying any prescription medications to the skin.      CHG Compatible Lotions   Aveeno Moisturizing lotion  Cetaphil Moisturizing Cream  Cetaphil Moisturizing Lotion  Clairol Herbal Essence Moisturizing Lotion, Dry Skin  Clairol Herbal Essence Moisturizing Lotion, Extra Dry Skin  Clairol Herbal Essence Moisturizing Lotion, Normal Skin  Curel Age Defying Therapeutic Moisturizing Lotion with Alpha Hydroxy  Curel Extreme Care Body Lotion  Curel Soothing Hands Moisturizing Hand Lotion  Curel Therapeutic  Moisturizing Cream, Fragrance-Free  Curel Therapeutic Moisturizing Lotion, Fragrance-Free  Curel Therapeutic Moisturizing Lotion, Original Formula  Eucerin Daily Replenishing Lotion  Eucerin Dry Skin Therapy Plus Alpha Hydroxy Crme  Eucerin Dry Skin Therapy Plus Alpha Hydroxy Lotion  Eucerin Original Crme  Eucerin Original Lotion  Eucerin Plus Crme Eucerin Plus Lotion  Eucerin TriLipid Replenishing Lotion  Keri Anti-Bacterial Hand Lotion  Keri Deep Conditioning Original Lotion Dry Skin Formula Softly Scented  Keri Deep Conditioning Original Lotion, Fragrance Free Sensitive Skin Formula  Keri Lotion Fast Absorbing Fragrance Free Sensitive Skin Formula  Keri Lotion Fast Absorbing Softly Scented Dry Skin Formula  Keri Original Lotion  Keri Skin Renewal Lotion Keri Silky Smooth Lotion  Keri Silky Smooth Sensitive Skin Lotion  Nivea Body Creamy Conditioning Oil  Nivea Body Extra Enriched Lotion  Nivea Body Original Lotion  Nivea Body Sheer Moisturizing Lotion Nivea Crme  Nivea Skin Firming Lotion  NutraDerm 30 Skin Lotion  NutraDerm Skin Lotion  NutraDerm Therapeutic Skin Cream  NutraDerm Therapeutic Skin Lotion  ProShield Protective Hand Cream  Provon moisturizing lotion     Preparing for Total Shoulder Arthroplasty ================================================================= Please follow these instructions carefully, in addition to any other special Bathing information that was explained to you at the Presurgical Appointment:  BENZOYL PEROXIDE 5% GEL: Used to kill bacteria on the skin which could cause an infection at the surgery site.   Please do not use if you have an  allergy to benzoyl peroxide. If your skin becomes reddened/irritated stop using the benzoyl peroxide and inform your Doctor.   Starting two days before surgery, apply as follows:  1. Apply benzoyl peroxide gel in the morning and at night. Apply after taking a shower. If you are not taking a shower,  clean entire shoulder front, back, and side, along with the armpit with a clean wet washcloth.  2. Place a quarter-sized dollop of the gel on your SHOULDER and rub in thoroughly, making sure to cover the front, back, and side of your shoulder, along with the armpit.   2 Days prior to Surgery   [facility]  October 21, 2023 First Application _______ Morning Second Application _______ Night  Day Before Surgery      THURSDAY  October 21, 2022 First Application______ Morning  On the night before surgery, wash your entire body (except hair, face and private areas) with CHG Soap. THEN, rub in the LAST application of the Benzoyl Peroxide Gel on your shoulder.   3. On the Morning of Surgery wash your BODY AGAIN with CHG Soap (except hair, face and private areas)  4. DO NOT USE THE BENZOYL PEROXIDE GEL ON THE DAY OF YOUR SURGERY       FAILURE TO FOLLOW THESE INSTRUCTIONS MAY RESULT IN THE CANCELLATION OF YOUR SURGERY  PATIENT SIGNATURE_________________________________  NURSE SIGNATURE__________________________________  ________________________________________________________________________      Rogelia Mire    An incentive spirometer is a tool that can help keep your lungs clear and active. This tool measures how well you are filling your lungs with each breath. Taking long deep breaths may help reverse or decrease the chance of developing breathing (pulmonary) problems (especially infection) following: A long period of time when you are unable to move or be active. BEFORE THE PROCEDURE  If the spirometer includes an indicator to show your best effort, your nurse or respiratory therapist will set it to a desired goal. If possible, sit up straight or lean slightly forward. Try not to slouch. Hold the incentive spirometer in an upright position. INSTRUCTIONS FOR USE  Sit on the edge of your bed if possible, or sit up as far as you can in bed or on a chair. Hold the incentive  spirometer in an upright position. Breathe out normally. Place the mouthpiece in your mouth and seal your lips tightly around it. Breathe in slowly and as deeply as possible, raising the piston or the ball toward the top of the column. Hold your breath for 3-5 seconds or for as long as possible. Allow the piston or ball to fall to the bottom of the column. Remove the mouthpiece from your mouth and breathe out normally. Rest for a few seconds and repeat Steps 1 through 7 at least 10 times every 1-2 hours when you are awake. Take your time and take a few normal breaths between deep breaths. The spirometer may include an indicator to show your best effort. Use the indicator as a goal to work toward during each repetition. After each set of 10 deep breaths, practice coughing to be sure your lungs are clear. If you have an incision (the cut made at the time of surgery), support your incision when coughing by placing a pillow or rolled up towels firmly against it. Once you are able to get out of bed, walk around indoors and cough well. You may stop using the incentive spirometer when instructed by your caregiver.  RISKS AND COMPLICATIONS Take your time so you  do not get dizzy or light-headed. If you are in pain, you may need to take or ask for pain medication before doing incentive spirometry. It is harder to take a deep breath if you are having pain. AFTER USE Rest and breathe slowly and easily. It can be helpful to keep track of a log of your progress. Your caregiver can provide you with a simple table to help with this. If you are using the spirometer at home, follow these instructions: SEEK MEDICAL CARE IF:  You are having difficultly using the spirometer. You have trouble using the spirometer as often as instructed. Your pain medication is not giving enough relief while using the spirometer. You develop fever of 100.5 F (38.1 C) or higher.                                                                                                     SEEK IMMEDIATE MEDICAL CARE IF:  You cough up bloody sputum that had not been present before. You develop fever of 102 F (38.9 C) or greater. You develop worsening pain at or near the incision site. MAKE SURE YOU:  Understand these instructions. Will watch your condition. Will get help right away if you are not doing well or get worse. Document Released: 02/16/2007 Document Revised: 12/29/2011 Document Reviewed: 04/19/2007 [facility] Patient Information 2014 [place], [place].

## 2023-10-16 ENCOUNTER — Encounter (HOSPITAL_COMMUNITY): Payer: Self-pay | Admitting: *Deleted

## 2023-10-16 ENCOUNTER — Other Ambulatory Visit: Payer: Self-pay

## 2023-10-16 ENCOUNTER — Encounter (HOSPITAL_COMMUNITY)
Admission: RE | Admit: 2023-10-16 | Discharge: 2023-10-16 | Disposition: A | Discharge status: Home / Self Care | Payer: Medicare PPO | Source: Ambulatory Visit | Attending: Orthopedic Surgery | Admitting: Orthopedic Surgery

## 2023-10-16 VITALS — BP 129/54 | HR 68 | Temp 97.6°F | Resp 12 | Ht 63.0 in | Wt 150.0 lb

## 2023-10-16 DIAGNOSIS — Z8673 Personal history of transient ischemic attack (TIA), and cerebral infarction without residual deficits: Secondary | ICD-10-CM | POA: Diagnosis not present

## 2023-10-16 DIAGNOSIS — M19011 Primary osteoarthritis, right shoulder: Secondary | ICD-10-CM | POA: Diagnosis not present

## 2023-10-16 DIAGNOSIS — I2510 Atherosclerotic heart disease of native coronary artery without angina pectoris: Secondary | ICD-10-CM | POA: Diagnosis not present

## 2023-10-16 DIAGNOSIS — I4891 Unspecified atrial fibrillation: Secondary | ICD-10-CM | POA: Diagnosis not present

## 2023-10-16 DIAGNOSIS — Z01818 Encounter for other preprocedural examination: Secondary | ICD-10-CM

## 2023-10-16 DIAGNOSIS — I10 Essential (primary) hypertension: Secondary | ICD-10-CM | POA: Diagnosis not present

## 2023-10-16 DIAGNOSIS — Z01812 Encounter for preprocedural laboratory examination: Secondary | ICD-10-CM | POA: Insufficient documentation

## 2023-10-16 DIAGNOSIS — R7303 Prediabetes: Secondary | ICD-10-CM | POA: Diagnosis not present

## 2023-10-16 HISTORY — DX: Prediabetes: R73.03

## 2023-10-16 HISTORY — DX: Fibromyalgia: M79.7

## 2023-10-16 LAB — BASIC METABOLIC PANEL
Anion gap: 10 (ref 5–15)
BUN: 18 mg/dL (ref 8–23)
CO2: 21 mmol/L — ABNORMAL LOW (ref 22–32)
Calcium: 10.1 mg/dL (ref 8.9–10.3)
Chloride: 108 mmol/L (ref 98–111)
Creatinine, Ser: 0.7 mg/dL (ref 0.44–1.00)
GFR, Estimated: >60 mL/min (ref >60)
Glucose, Bld: 88 mg/dL (ref 70–99)
Potassium: 3.5 mmol/L (ref 3.5–5.1)
Sodium: 139 mmol/L (ref 135–145)

## 2023-10-16 LAB — CBC
HCT: 40.5 % (ref 36.0–46.0)
Hemoglobin: 12.2 g/dL (ref 12.0–15.0)
MCH: 30.8 pg (ref 26.0–34.0)
MCHC: 30.1 g/dL (ref 30.0–36.0)
MCV: 102.3 fL — ABNORMAL HIGH (ref 80.0–100.0)
Platelets: 247 10*3/uL (ref 150–400)
RBC: 3.96 MIL/uL (ref 3.87–5.11)
RDW: 14.2 % (ref 11.5–15.5)
WBC: 5.6 10*3/uL (ref 4.0–10.5)
nRBC: 0 % (ref 0.0–0.2)

## 2023-10-16 LAB — SURGICAL PCR SCREEN
MRSA, PCR: NEGATIVE
Staphylococcus aureus: NEGATIVE

## 2023-10-19 NOTE — Progress Notes (Signed)
Anesthesia Chart Review   Case: [id number] Date/Time: 10/23/23 0715   Procedure: REVERSE SHOULDER ARTHROPLASTY (Right: Shoulder) - interscalene block  110   Anesthesia type: Choice   Pre-op diagnosis: Right shoulder rotator cuff arthropathy, osteoarthritis   Location: [facility] 06 / [facility]   Surgeons: Beverely Low, MD       DISCUSSION:88 y.o. never smoker with h/o HTN, stroke, atrial fibrillation, right shoulder OA scheduled for above procedure 10/23/2023 with Dr. Beverely Low.   Pt last seen by cardiology 09/23/2023. Per OV note afib stable, mitral and tricuspid regurgitation stable with no signs of heart failure, HTN well controlled.  Per Dr. Jacinto Halim, "Pending surgery with Dr. Beverely Low. No preoperative cardiac testing required given stable cardiac status and recent successful surgeries (left knee arthroplasty in June 2023, right knee arthroplasty in December 2023, right hip arthroplasty in February 2024). "  Echo 07/02/2023 with EF 55-60%, moderate mitral regurgitation, severe tricuspid regurgitation, moderate pulmonary HTN.    VS: BP (!) 129/54 Comment: left arm sitting  Pulse 68   Temp 36.4 C (Oral)   Resp 12   Ht 5\' 3"  (1.6 m)   Wt 68 kg   SpO2 100%   BMI 26.57 kg/m   PROVIDERS: Rodrigo Ran, MD is PCP   Cardiologist - Yates Decamp, MD    LABS: Labs reviewed: Acceptable for surgery. (all labs ordered are listed, but only abnormal results are displayed)  Labs Reviewed  BASIC METABOLIC PANEL - Abnormal; Notable for the following components:      Result Value   CO2 21 (*)    All other components within normal limits  CBC - Abnormal; Notable for the following components:   MCV 102.3 (*)    All other components within normal limits  SURGICAL PCR SCREEN     IMAGES:   EKG:   CV: Echocardiogram 07/02/2023:  Left ventricle cavity is normal in size. Mild concentric hypertrophy of  the left ventricle. Normal global wall motion. Abnormal septal wall motion  due to  right ventricular volume and pressure  overload. Indeterminate  diastolic filling pattern. Normal LV systolic function with visual EF  55-60%. Calculated EF 53%.  Left atrial cavity is severely dilated at 96.9 ml/m^2.  Right atrial cavity is severely dilated.  Right ventricle cavity is mildly dilated. Normal right ventricular  function.  Structurally normal trileaflet aortic valve. No evidence of aortic  stenosis. Mild (Grade I) aortic regurgitation.  Native mitral valve. Moderate (Grade II) mitral regurgitation. Mild  prolapse of the mitral valve leaflets.  Structurally normal tricuspid valve. Severe tricuspid regurgitation.  Moderate pulmonary hypertension. RVSP measures 53 mmHg.  Structurally normal pulmonic valve. Mild to moderate pulmonic  regurgitation.  IVC is dilated with poor inspiration collapse consistent with elevated  right atrial pressure.  No significant change since 07/24/2022 and 10/30/2020.  Past Medical History:  Diagnosis Date   Anemia    Anxiety    Arthritis    Atrial fibrillation (HCC)    Breast cancer, left (HCC)    Complication of anesthesia    rapid heart beat afterwards sometimes   Depression    Dysrhythmia    A-Fib   Fibromyalgia    History of kidney stones    Hyperlipidemia    Hypertension    Pneumonia    Pre-diabetes    Stroke [facility])    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, 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   Tachycardia    Urinary frequency     Past Surgical  History:  Procedure Laterality Date   ABDOMINAL HYSTERECTOMY  1996   APPENDECTOMY  1956   BREAST BIOPSY Left 05/07/2016   BREAST BIOPSY Bilateral 1956, 1980   BREAST LUMPECTOMY WITH RADIOACTIVE SEED LOCALIZATION Left 06/16/2016   Procedure: LEFT BREAST LUMPECTOMY WITH RADIOACTIVE SEED LOCALIZATION;  Surgeon: Chevis Pretty III, MD;  Location: [facility] OR;  Service: General;  Laterality: Left;   COLONOSCOPY     KIDNEY STONE SURGERY     KNEE SURGERY  11/20/2004   TOTAL HIP ARTHROPLASTY Left 11/09/2022   Procedure:  TOTAL HIP ARTHROPLASTY ANTERIOR APPROACH;  Surgeon: Durene Romans, MD;  Location: [facility];  Service: Orthopedics;  Laterality: Left;   TOTAL KNEE ARTHROPLASTY Right 04/07/2022   Procedure: TOTAL KNEE ARTHROPLASTY;  Surgeon: Ollen Gross, MD;  Location: [facility];  Service: Orthopedics;  Laterality: Right;   TOTAL KNEE ARTHROPLASTY Left 09/22/2022   Procedure: TOTAL KNEE ARTHROPLASTY;  Surgeon: Ollen Gross, MD;  Location: [facility];  Service: Orthopedics;  Laterality: Left;    MEDICATIONS:  acetaminophen (TYLENOL) 500 MG tablet   allopurinol (ZYLOPRIM) 100 MG tablet   apixaban (ELIQUIS) 2.5 MG TABS tablet   atenolol (TENORMIN) 50 MG tablet   B Complex-C (B-COMPLEX WITH VITAMIN C) tablet   Biotin (BIOTIN 5000) 5 MG CAPS   Cholecalciferol (VITAMIN D3) 50 MCG (2000 UT) CAPS   ferrous sulfate 325 (65 FE) MG tablet   loratadine (CLARITIN) 10 MG tablet   Melatonin 10 MG TABS   rosuvastatin (CRESTOR) 5 MG tablet   venlafaxine XR (EFFEXOR-XR) 37.5 MG 24 hr capsule   No current facility-administered medications for this encounter.     Jodell Cipro Ward, PA-C [facility] Pre-Surgical Testing [phone]

## 2023-10-19 NOTE — Anesthesia Preprocedure Evaluation (Signed)
Anesthesia Evaluation    Airway        Dental   Pulmonary           Cardiovascular hypertension,      Neuro/Psych    GI/Hepatic   Endo/Other    Renal/GU      Musculoskeletal   Abdominal   Peds  Hematology   Anesthesia Other Findings   Reproductive/Obstetrics                             Anesthesia Physical Anesthesia Plan  ASA:   Anesthesia Plan:    Post-op Pain Management:    Induction:   PONV Risk Score and Plan:   Airway Management Planned:   Additional Equipment:   Intra-op Plan:   Post-operative Plan:   Informed Consent:   Plan Discussed with:   Anesthesia Plan Comments: (See PAT note 10/16/2023)       Anesthesia Quick Evaluation

## 2023-10-22 ENCOUNTER — Encounter (HOSPITAL_COMMUNITY): Payer: Self-pay | Admitting: Orthopedic Surgery

## 2023-10-23 ENCOUNTER — Encounter (HOSPITAL_COMMUNITY)
Admission: RE | Disposition: A | Discharge status: Home / Self Care | Payer: Self-pay | Source: Home / Self Care | Attending: Orthopedic Surgery

## 2023-10-23 ENCOUNTER — Ambulatory Visit (HOSPITAL_COMMUNITY): Payer: Medicare PPO | Admitting: Anesthesiology

## 2023-10-23 ENCOUNTER — Ambulatory Visit (HOSPITAL_COMMUNITY)
Admission: RE | Admit: 2023-10-23 | Discharge: 2023-10-23 | Disposition: A | Discharge status: Home / Self Care | Payer: Medicare PPO | Attending: Orthopedic Surgery | Admitting: Orthopedic Surgery

## 2023-10-23 ENCOUNTER — Ambulatory Visit (HOSPITAL_COMMUNITY): Payer: Medicare PPO

## 2023-10-23 ENCOUNTER — Encounter (HOSPITAL_COMMUNITY): Payer: Self-pay | Admitting: Orthopedic Surgery

## 2023-10-23 ENCOUNTER — Other Ambulatory Visit: Payer: Self-pay

## 2023-10-23 ENCOUNTER — Ambulatory Visit (HOSPITAL_COMMUNITY): Payer: Medicare PPO | Admitting: Physician Assistant

## 2023-10-23 DIAGNOSIS — G8918 Other acute postprocedural pain: Secondary | ICD-10-CM | POA: Diagnosis not present

## 2023-10-23 DIAGNOSIS — M75101 Unspecified rotator cuff tear or rupture of right shoulder, not specified as traumatic: Secondary | ICD-10-CM | POA: Insufficient documentation

## 2023-10-23 DIAGNOSIS — M19011 Primary osteoarthritis, right shoulder: Secondary | ICD-10-CM | POA: Diagnosis not present

## 2023-10-23 DIAGNOSIS — I10 Essential (primary) hypertension: Secondary | ICD-10-CM | POA: Diagnosis not present

## 2023-10-23 DIAGNOSIS — M1990 Unspecified osteoarthritis, unspecified site: Secondary | ICD-10-CM | POA: Insufficient documentation

## 2023-10-23 DIAGNOSIS — F419 Anxiety disorder, unspecified: Secondary | ICD-10-CM | POA: Insufficient documentation

## 2023-10-23 DIAGNOSIS — E785 Hyperlipidemia, unspecified: Secondary | ICD-10-CM | POA: Diagnosis not present

## 2023-10-23 DIAGNOSIS — F32A Depression, unspecified: Secondary | ICD-10-CM | POA: Diagnosis not present

## 2023-10-23 DIAGNOSIS — I4891 Unspecified atrial fibrillation: Secondary | ICD-10-CM | POA: Diagnosis not present

## 2023-10-23 DIAGNOSIS — I739 Peripheral vascular disease, unspecified: Secondary | ICD-10-CM | POA: Insufficient documentation

## 2023-10-23 DIAGNOSIS — Z7901 Long term (current) use of anticoagulants: Secondary | ICD-10-CM | POA: Diagnosis not present

## 2023-10-23 DIAGNOSIS — H9191 Unspecified hearing loss, right ear: Secondary | ICD-10-CM | POA: Insufficient documentation

## 2023-10-23 DIAGNOSIS — M797 Fibromyalgia: Secondary | ICD-10-CM | POA: Diagnosis not present

## 2023-10-23 DIAGNOSIS — M12811 Other specific arthropathies, not elsewhere classified, right shoulder: Secondary | ICD-10-CM

## 2023-10-23 DIAGNOSIS — Z96611 Presence of right artificial shoulder joint: Secondary | ICD-10-CM | POA: Diagnosis not present

## 2023-10-23 DIAGNOSIS — Z79899 Other long term (current) drug therapy: Secondary | ICD-10-CM | POA: Diagnosis not present

## 2023-10-23 DIAGNOSIS — Z471 Aftercare following joint replacement surgery: Secondary | ICD-10-CM | POA: Diagnosis not present

## 2023-10-23 HISTORY — PX: REVERSE SHOULDER ARTHROPLASTY: SHX5054

## 2023-10-23 SURGERY — ARTHROPLASTY, SHOULDER, TOTAL, REVERSE
Anesthesia: General | Site: Shoulder | Laterality: Right

## 2023-10-23 MED ORDER — DEXAMETHASONE SODIUM PHOSPHATE 10 MG/ML IJ SOLN
INTRAMUSCULAR | Status: DC | PRN
  Administered 2023-10-23: 4 mg via INTRAVENOUS

## 2023-10-23 MED ORDER — VANCOMYCIN HCL IN DEXTROSE 1-5 GM/200ML-% IV SOLN
1000.0000 mg | Freq: Once | INTRAVENOUS | Status: AC
  Administered 2023-10-23: 1000 mg via INTRAVENOUS
  Filled 2023-10-23: qty 200

## 2023-10-23 MED ORDER — ONDANSETRON HCL 4 MG/2ML IJ SOLN
INTRAMUSCULAR | Status: AC
  Filled 2023-10-23: qty 2

## 2023-10-23 MED ORDER — TRANEXAMIC ACID-NACL 1000-0.7 MG/100ML-% IV SOLN
1000.0000 mg | INTRAVENOUS | Status: AC
  Administered 2023-10-23: 1000 mg via INTRAVENOUS
  Filled 2023-10-23: qty 100

## 2023-10-23 MED ORDER — LIDOCAINE HCL (PF) 2 % IJ SOLN
INTRAMUSCULAR | Status: AC
  Filled 2023-10-23: qty 10

## 2023-10-23 MED ORDER — 0.9 % SODIUM CHLORIDE (POUR BTL) OPTIME
TOPICAL | Status: DC | PRN
  Administered 2023-10-23: 1000 mL

## 2023-10-23 MED ORDER — PROPOFOL 10 MG/ML IV BOLUS
INTRAVENOUS | Status: AC
  Filled 2023-10-23: qty 20

## 2023-10-23 MED ORDER — TRAMADOL HCL 50 MG PO TABS
50.0000 mg | ORAL_TABLET | Freq: Once | ORAL | Status: DC
Start: 2023-10-23 — End: 2023-10-23

## 2023-10-23 MED ORDER — ORAL CARE MOUTH RINSE: 15.0000 mL | Freq: Once | OROMUCOSAL | Status: AC

## 2023-10-23 MED ORDER — CHLORHEXIDINE GLUCONATE 0.12 % MT SOLN
15.0000 mL | Freq: Once | OROMUCOSAL | Status: AC
  Administered 2023-10-23: 15 mL via OROMUCOSAL

## 2023-10-23 MED ORDER — PROPOFOL 10 MG/ML IV BOLUS
INTRAVENOUS | Status: DC | PRN
  Administered 2023-10-23: 100 mg via INTRAVENOUS

## 2023-10-23 MED ORDER — ROCURONIUM BROMIDE 10 MG/ML (PF) SYRINGE
PREFILLED_SYRINGE | INTRAVENOUS | Status: DC | PRN
  Administered 2023-10-23: 50 mg via INTRAVENOUS

## 2023-10-23 MED ORDER — BUPIVACAINE LIPOSOME 1.3 % IJ SUSP
INTRAMUSCULAR | Status: DC | PRN
  Administered 2023-10-23: 10 mL via PERINEURAL

## 2023-10-23 MED ORDER — LACTATED RINGERS IV SOLN: INTRAVENOUS | Status: DC

## 2023-10-23 MED ORDER — ONDANSETRON HCL 4 MG/2ML IJ SOLN
INTRAMUSCULAR | Status: DC | PRN
  Administered 2023-10-23: 4 mg via INTRAVENOUS

## 2023-10-23 MED ORDER — BUPIVACAINE-EPINEPHRINE (PF) 0.25% -1:200000 IJ SOLN
INTRAMUSCULAR | Status: DC | PRN
  Administered 2023-10-23: 17 mL

## 2023-10-23 MED ORDER — ROCURONIUM BROMIDE 10 MG/ML (PF) SYRINGE
PREFILLED_SYRINGE | INTRAVENOUS | Status: AC
  Filled 2023-10-23: qty 20

## 2023-10-23 MED ORDER — BUPIVACAINE-EPINEPHRINE 0.25% -1:200000 IJ SOLN
INTRAMUSCULAR | Status: AC
Start: 2023-10-23 — End: ?
  Filled 2023-10-23: qty 1

## 2023-10-23 MED ORDER — ACETAMINOPHEN 500 MG PO TABS
1000.0000 mg | ORAL_TABLET | Freq: Once | ORAL | Status: AC
  Administered 2023-10-23: 1000 mg via ORAL
  Filled 2023-10-23: qty 2

## 2023-10-23 MED ORDER — PHENYLEPHRINE 80 MCG/ML (10ML) SYRINGE FOR IV PUSH (FOR BLOOD PRESSURE SUPPORT)
PREFILLED_SYRINGE | INTRAVENOUS | Status: DC | PRN
  Administered 2023-10-23: 80 ug via INTRAVENOUS
  Administered 2023-10-23: 160 ug via INTRAVENOUS

## 2023-10-23 MED ORDER — STERILE WATER FOR IRRIGATION IR SOLN
Status: DC | PRN
  Administered 2023-10-23: 1000 mL

## 2023-10-23 MED ORDER — ONDANSETRON HCL 4 MG/2ML IJ SOLN
INTRAMUSCULAR | Status: AC
  Filled 2023-10-23: qty 4

## 2023-10-23 MED ORDER — FENTANYL CITRATE PF 50 MCG/ML IJ SOSY
25.0000 ug | PREFILLED_SYRINGE | INTRAMUSCULAR | Status: DC | PRN
Start: 2023-10-23 — End: 2023-10-23

## 2023-10-23 MED ORDER — SUGAMMADEX SODIUM 200 MG/2ML IV SOLN
INTRAVENOUS | Status: DC | PRN
  Administered 2023-10-23: 200 mg via INTRAVENOUS

## 2023-10-23 MED ORDER — DEXAMETHASONE SODIUM PHOSPHATE 10 MG/ML IJ SOLN
INTRAMUSCULAR | Status: AC
  Filled 2023-10-23: qty 2

## 2023-10-23 MED ORDER — PHENYLEPHRINE HCL-NACL 20-0.9 MG/250ML-% IV SOLN
INTRAVENOUS | Status: DC | PRN
  Administered 2023-10-23: 45 ug/min via INTRAVENOUS

## 2023-10-23 MED ORDER — ROPIVACAINE HCL 5 MG/ML IJ SOLN
INTRAMUSCULAR | Status: DC | PRN
  Administered 2023-10-23: 15 mL via PERINEURAL

## 2023-10-23 MED ORDER — TRAMADOL HCL 50 MG PO TABS: 50.0000 mg | ORAL_TABLET | Freq: Four times a day (QID) | ORAL | 0 refills | Status: DC | PRN

## 2023-10-23 MED ORDER — FENTANYL CITRATE (PF) 100 MCG/2ML IJ SOLN
INTRAMUSCULAR | Status: DC | PRN
  Administered 2023-10-23: 50 ug via INTRAVENOUS

## 2023-10-23 MED ORDER — LIDOCAINE 2% (20 MG/ML) 5 ML SYRINGE
INTRAMUSCULAR | Status: DC | PRN
  Administered 2023-10-23: 40 mg via INTRAVENOUS

## 2023-10-23 MED ORDER — FENTANYL CITRATE (PF) 100 MCG/2ML IJ SOLN
INTRAMUSCULAR | Status: AC
  Filled 2023-10-23: qty 2

## 2023-10-23 MED ORDER — ONDANSETRON HCL 4 MG/2ML IJ SOLN
4.0000 mg | Freq: Once | INTRAMUSCULAR | Status: AC | PRN
  Administered 2023-10-23: 4 mg via INTRAVENOUS

## 2023-10-23 SURGICAL SUPPLY — 60 items
BAG COUNTER SPONGE SURGICOUNT (BAG) IMPLANT
BAG ZIPLOCK 12X15 (MISCELLANEOUS) IMPLANT
BIT DRILL 1.6MX128 (BIT) IMPLANT
BIT DRILL 170X2.5X (BIT) IMPLANT
BIT DRL 170X2.5X (BIT) ×1 IMPLANT
BLADE SAG 18X100X1.27 (BLADE) ×1 IMPLANT
COVER BACK TABLE 60X90IN (DRAPES) ×1 IMPLANT
COVER SURGICAL LIGHT HANDLE (MISCELLANEOUS) ×1 IMPLANT
DRAPE INCISE IOBAN 66X45 STRL (DRAPES) ×1 IMPLANT
DRAPE SHEET LG 3/4 BI-LAMINATE (DRAPES) ×1 IMPLANT
DRAPE SURG ORHT 6 SPLT 77X108 (DRAPES) ×2 IMPLANT
DRAPE TOP 10253 STERILE (DRAPES) ×1 IMPLANT
DRAPE U-SHAPE 47X51 STRL (DRAPES) ×1 IMPLANT
DRSG ADAPTIC 3X8 NADH LF (GAUZE/BANDAGES/DRESSINGS) ×1 IMPLANT
DURAPREP 26ML APPLICATOR (WOUND CARE) ×1 IMPLANT
ECCENTRIC EPIPHYSI MODULAR SZ1 (Trauma) IMPLANT
ELECT BLADE TIP CTD 4 INCH (ELECTRODE) ×1 IMPLANT
ELECT NDL TIP 2.8 STRL (NEEDLE) ×1 IMPLANT
ELECT NEEDLE TIP 2.8 STRL (NEEDLE) ×1 IMPLANT
ELECT REM PT RETURN 15FT ADLT (MISCELLANEOUS) ×1 IMPLANT
FACESHIELD WRAPAROUND (MASK) ×1 IMPLANT
FACESHIELD WRAPAROUND OR TEAM (MASK) ×1 IMPLANT
GAUZE PAD ABD 8X10 STRL (GAUZE/BANDAGES/DRESSINGS) ×1 IMPLANT
GAUZE SPONGE 4X4 12PLY STRL (GAUZE/BANDAGES/DRESSINGS) ×1 IMPLANT
GLENOSPHERE DELTA XTEND LAT 38 (Miscellaneous) IMPLANT
GLOVE BIOGEL PI IND STRL 7.5 (GLOVE) ×1 IMPLANT
GLOVE BIOGEL PI IND STRL 8.5 (GLOVE) ×1 IMPLANT
GLOVE ORTHO TXT STRL SZ7.5 (GLOVE) ×1 IMPLANT
GLOVE SURG ORTHO 8.5 STRL (GLOVE) ×1 IMPLANT
GOWN STRL REUS W/ TWL XL LVL3 (GOWN DISPOSABLE) ×2 IMPLANT
KIT BASIN OR (CUSTOM PROCEDURE TRAY) ×1 IMPLANT
KIT TURNOVER KIT A (KITS) IMPLANT
MANIFOLD NEPTUNE II (INSTRUMENTS) ×1 IMPLANT
METAGLENE DELTA EXTEND (Trauma) IMPLANT
METAGLENE DXTEND (Trauma) ×1 IMPLANT
MODULAR ECCENTRIC EPIPHYSI SZ1 (Trauma) ×1 IMPLANT
NDL MAYO CATGUT SZ4 TPR NDL (NEEDLE) IMPLANT
NEEDLE MAYO CATGUT SZ4 (NEEDLE) IMPLANT
NS IRRIG 1000ML POUR BTL (IV SOLUTION) ×1 IMPLANT
PACK SHOULDER (CUSTOM PROCEDURE TRAY) ×1 IMPLANT
PIN GUIDE 1.2 (PIN) IMPLANT
PIN GUIDE GLENOPHERE 1.5MX300M (PIN) IMPLANT
PIN METAGLENE 2.5 (PIN) IMPLANT
RESTRAINT HEAD UNIVERSAL NS (MISCELLANEOUS) ×1 IMPLANT
SCREW 48L (Screw) IMPLANT
SCREW LOCK DELTA XTEND 4.5X30 (Screw) IMPLANT
SLING ARM FOAM STRAP LRG (SOFTGOODS) IMPLANT
SPACER 38 PLUS 3 (Spacer) IMPLANT
SPIKE FLUID TRANSFER (MISCELLANEOUS) ×1 IMPLANT
SPONGE T-LAP 4X18 ~~LOC~~+RFID (SPONGE) IMPLANT
STEM DELTA DIA 10 HA (Stem) IMPLANT
STRIP CLOSURE SKIN 1/2X4 (GAUZE/BANDAGES/DRESSINGS) ×1 IMPLANT
SUT FIBERWIRE #2 38 T-5 BLUE (SUTURE) ×1 IMPLANT
SUT MNCRL AB 4-0 PS2 18 (SUTURE) ×1 IMPLANT
SUT VIC AB 0 CT1 36 (SUTURE) ×1 IMPLANT
SUT VIC AB 0 CT2 27 (SUTURE) ×1 IMPLANT
SUT VIC AB 1 CT1 36 (SUTURE) IMPLANT
SUT VIC AB 2-0 CT1 TAPERPNT 27 (SUTURE) ×1 IMPLANT
SUTURE FIBERWR #2 38 T-5 BLUE (SUTURE) ×1 IMPLANT
TOWEL OR 17X26 10 PK STRL BLUE (TOWEL DISPOSABLE) ×1 IMPLANT

## 2023-10-23 NOTE — Evaluation (Signed)
 Occupational Therapy Evaluation Patient Details Name: Kathryn Ayala MRN: [id number] DOB: Feb 04, 1935 Today's Date: 10/23/2023   History of Present Illness Pt is an 89yo female s/p R reverse TSA. PMH: B TKA; L THA s/p fall; afib, hx of breast cancer, HLD, HTN, PNA, hx of stroke (deaf R ear), R-TKA 04/07/22, CKD, PVD, gout, fibromyalgia.   Clinical Impression   PTA pt lives alone independently. Education completed regarding compensatory strategies for ADL tasks, management of sling, ROM  per specified parameters in the order set, positioning of operated arm in sitting and supine and edema control, including use of ice for edema/pain control. Family/caregiver present for education, written handouts provided/reviewed and  and pt/caregiver verbalize/demonstrated understanding using Teach Back. Pt to follow up with MD to progress rehab of the operative shoulder.       If plan is discharge home, recommend the following: A little help with walking and/or transfers;A lot of help with bathing/dressing/bathroom;Assistance with cooking/housework;Assist for transportation;Help with stairs or ramp for entrance    Functional Status Assessment  Patient has had a recent decline in their functional status and demonstrates the ability to make significant improvements in function in a reasonable and predictable amount of time.  Equipment Recommendations  None recommended by OT    Recommendations for Other Services       Precautions / Restrictions Precautions Precautions: Fall;Shoulder Type of Shoulder Precautions: no A/PROM shoulder; elbow wrist hand AROM permitted; able to use RUE for light ADL tasks Shoulder Interventions: Shoulder sling/immobilizer;Off for dressing/bathing/exercises;At all times Precaution Booklet Issued: Yes (comment) Required Braces or Orthoses: Sling Restrictions Weight Bearing Restrictions Per Provider Order: Yes RUE Weight Bearing Per Provider Order: Non weight bearing       Mobility Bed Mobility               General bed mobility comments: educated onneed to exit on L side of bed; pt has a recliner she can sleep in if desired    Transfers Overall transfer level: Needs assistance   Transfers: Sit to/from Stand Sit to Stand: Min assist           General transfer comment: unsteady; daughter able to assist; Educated regarding need to stand on her Mom's non-surgical side      Balance Overall balance assessment: Needs assistance   Sitting balance-Leahy Scale: Good       Standing balance-Leahy Scale: Poor Standing balance comment: daughter has gait belt at home; recommend use                           ADL either performed or assessed with clinical judgement   ADL Overall ADL's : Needs assistance/impaired                                     Functional mobility during ADLs: Minimal assistance (unsteady s/p surgery) General ADL Comments: see below in shoulder section; daughter presetn for educaiton and assisted with dressing while learning compensatory strategies; Educated on stratieges to reduce Ayala of falls and encouraged pt to initially use BSC instead of walking to the bathroom to reduce Ayala of falls     Vision Baseline Vision/History: 1 Wears glasses Vision Assessment?: Wears glasses for reading;Wears glasses for driving     Perception         Praxis         Pertinent Vitals/Pain Pain Assessment Pain  Assessment: No/denies pain     Extremity/Trunk Assessment Upper Extremity Assessment Upper Extremity Assessment: Right hand dominant RUE Deficits / Details: block active; elbow/wrist/hadn ROM returning RUE Coordination: decreased fine motor;decreased gross motor           Communication Communication Communication: No apparent difficulties   Cognition Arousal: Alert Behavior During Therapy: WFL for tasks assessed/performed Overall Cognitive Status: Within Functional Limits for tasks  assessed                                       General Comments       Exercises Exercises: Shoulder Shoulder Exercises Elbow Flexion: AAROM, Right, 10 reps Elbow Extension: AAROM, Right, 10 reps Wrist Flexion: AAROM, Right, 10 reps Wrist Extension: AAROM, Right, 10 reps Digit Composite Flexion: AAROM, Right, 10 reps Composite Extension: AAROM, Right, 10 reps   Shoulder Instructions Shoulder Instructions Donning/doffing shirt without moving shoulder: Caregiver independent with task Method for sponge bathing under operated UE: Caregiver independent with task Donning/doffing sling/immobilizer: Caregiver independent with task Correct positioning of sling/immobilizer: Caregiver independent with task ROM for elbow, wrist and digits of operated UE: Caregiver independent with task;Patient able to independently direct caregiver Sling wearing schedule (on at all times/off for ADL's): Caregiver independent with task Proper positioning of operated UE when showering: Caregiver independent with task Dressing change: Caregiver independent with task Positioning of UE while sleeping: Caregiver independent with task    Home Living Family/patient expects to be discharged to:: Private residence Living Arrangements: Children;Other relatives Available Help at Discharge: Family;Available 24 hours/day Type of Home: House Home Access: Stairs to enter [employer] of Steps: 2 Entrance Stairs-Rails: Left Home Layout: One level     Bathroom Shower/Tub: Producer, Television/film/video: Handicapped height Bathroom Accessibility: Yes How Accessible: Accessible via walker Home Equipment: Grab bars - tub/shower;Grab bars - toilet;Rolling Walker (2 wheels);Shower seat;Hand held shower head;BSC/3in1          Prior Functioning/Environment Prior Level of Function : Independent/Modified Independent;Driving             Mobility Comments: uses a cane in the community;  furniture walks at home          OT Problem List: Decreased strength;Decreased range of motion;Decreased knowledge of precautions;Decreased knowledge of use of DME or AE;Impaired UE functional use      OT Treatment/Interventions:      OT Goals(Current goals can be found in the care plan section) Acute Rehab OT Goals Patient Stated Goal: increase use of R shoulder OT Goal Formulation: All assessment and education complete, DC therapy  OT Frequency:      Co-evaluation              AM-PAC OT 6 Clicks Daily Activity     Outcome Measure Help from another person eating meals?: None Help from another person taking care of personal grooming?: A Little Help from another person toileting, which includes using toliet, bedpan, or urinal?: A Little Help from another person bathing (including washing, rinsing, drying)?: A Lot Help from another person to put on and taking off regular upper body clothing?: A Lot Help from another person to put on and taking off regular lower body clothing?: A Lot 6 Click Score: 16   End of Session Nurse Communication: Mobility status  Activity Tolerance: Patient tolerated treatment well Patient left: with family/visitor present;in chair  OT Visit Diagnosis: Muscle weakness (generalized) (  M62.81)                Time: 8890-8862 OT Time Calculation (min): 28 min Charges:  OT General Charges $OT Visit: 1 Visit OT Evaluation $OT Eval Low Complexity: 1 Low OT Treatments $Self Care/Home Management : 8-22 mins  Kreg Sink, OT/L   Acute OT Clinical Specialist Acute Rehabilitation Services Pager [phone] Office [phone]   [facility] 10/23/2023, 3:49 PM

## 2023-10-23 NOTE — Anesthesia Procedure Notes (Signed)
 Anesthesia Regional Block: Interscalene brachial plexus block   Pre-Anesthetic Checklist: , timeout performed,  Correct Patient, Correct Site, Correct Laterality,  Correct Procedure, Correct Position, site marked,  Risks and benefits discussed,  Surgical consent,  Pre-op evaluation,  At surgeon's request and post-op pain management  Laterality: Right  Prep: Maximum Sterile Barrier Precautions used, chloraprep       Needles:  Injection technique: Single-shot  Needle Type: Echogenic Stimulator Needle     Needle Length: 9cm  Needle Gauge: 22     Additional Needles:   Procedures:,,,, ultrasound used (permanent image in chart),,    Narrative:  Start time: 10/23/2023 7:15 AM End time: 10/23/2023 7:20 AM Injection made incrementally with aspirations every 5 mL.  Performed by: Personally  Anesthesiologist: Merla Almarie HERO, DO  Additional Notes: Monitors applied. No increased pain on injection. No increased resistance to injection. Injection made in 5cc increments. Good needle visualization. Patient tolerated procedure well.

## 2023-10-23 NOTE — Progress Notes (Signed)
 OT Note All education regarding ADL and management complete. Full note to follow.  Luisa Dago, OT/L   Acute OT Clinical Specialist Acute Rehabilitation Services Pager [phone] Office [phone]

## 2023-10-23 NOTE — Brief Op Note (Signed)
 10/23/2023  9:22 AM  PATIENT:  Kathryn Ayala  89 y.o. female  PRE-OPERATIVE DIAGNOSIS:  Right shoulder rotator cuff arthropathy, osteoarthritis  POST-OPERATIVE DIAGNOSIS:  Right shoulder rotator cuff arthropathy, osteoarthritis  PROCEDURE:  Procedure(s) with comments: REVERSE SHOULDER ARTHROPLASTY (Right) - interscalene block  110 DePuy Delta Xtend with NO subscap repair  SURGEON:  Surgeons and Role:    DEWAINE Kay Kemps, MD - Primary  PHYSICIAN ASSISTANT:   ASSISTANTS: Debby KATHEE Fireman, PA-C   ANESTHESIA:   regional and general  EBL:  200 mL   BLOOD ADMINISTERED:none  DRAINS: none   LOCAL MEDICATIONS USED:  MARCAINE      SPECIMEN:  No Specimen  DISPOSITION OF SPECIMEN:  N/A  COUNTS:  YES  TOURNIQUET:  * No tourniquets in log *  DICTATION: .Other Dictation: Dictation Number [id number]  PLAN OF CARE: Discharge to home after PACU  PATIENT DISPOSITION:  PACU - hemodynamically stable.   Delay start of Pharmacological VTE agent (>24hrs) due to surgical blood loss or risk of bleeding: not applicable

## 2023-10-23 NOTE — Care Plan (Signed)
 Ortho Bundle Case Management Note  Patient Details  Name: Kathryn Ayala MRN: [id number] Date of Birth: 12/10/1934                  R Rev TSA on 10-23-23  DCP: Home with dtr and granddtr  DME: No needs  PT: HEP   DME Arranged:  N/A DME Agency:       Additional Comments: Please contact me with any questions of if this plan should need to change.   Kate DELENA Kraft, RN,CCM [facility]  [phone] 10/23/2023, 9:52 AM

## 2023-10-23 NOTE — Op Note (Signed)
 Kathryn Ayala, Kathryn Ayala MEDICAL RECORD NO: [id number] ACCOUNT NO: [id number] DATE OF BIRTH: 12/30/1934 FACILITY: [facility] LOCATION: [facility] PHYSICIAN: Elspeth SAUNDERS. Kay, MD  Operative Report   DATE OF PROCEDURE: 10/23/2023  PREOPERATIVE DIAGNOSIS:  Right shoulder end-stage rotator cuff tear arthropathy.  POSTOPERATIVE DIAGNOSIS:  Right shoulder end-stage rotator cuff tear arthropathy.  PROCEDURE PERFORMED:  Right reverse total shoulder arthroplasty using DePuy Delta Xtend prosthesis with no subscapularis repair.  ATTENDING SURGEON:  Elspeth SAUNDERS. Kay, MD  ASSISTANT:  Debby Crock [place], [place], who was scrubbed during the entire procedure, and necessary for satisfactory completion of surgery.  ANESTHESIA:  General anesthesia was used plus interscalene block.  ESTIMATED BLOOD LOSS:  200 mL.  FLUID REPLACEMENT:  1000 mL crystalloid.  COUNTS:  Instrument count was correct.  COMPLICATIONS:  There were no complications.  ANTIBIOTICS:  Perioperative antibiotics were given.  INDICATIONS:  The patient is an 89 year old female with progressively worsening right shoulder pain due to rotator cuff tear arthropathy.  The patient has failed all measures of conservative management and desires operative treatment to eliminate pain  and restore function.  Informed consent obtained.  DESCRIPTION OF PROCEDURE:  After an adequate level of anesthesia was achieved, the patient was positioned in the modified beach chair position.  The right shoulder was correctly identified and sterile prep and drape performed.  Timeout called verifying  correct patient and correct site.  We entered the patient's shoulder using a standard deltopectoral incision starting at the coracoid process and extending down to the anterior humerus.  Dissection down through subcutaneous tissues using Bovie, cephalic  vein identified and taken laterally at the deltoid.  Pectoralis was taken medially.  The conjoined tendon was identified and  retracted medially.  Deep retractor was placed.  I tenodesed the biceps in situ with 0 Vicryl figure-of-eight suture x2.  We  incorporated part of the pectoralis tendon for that repair.  We then released the subscapularis, subperiosteal off the lesser tuberosity and tagged for protection of the axillary nerve, planning to release that at the end of surgery.  We released the  inferior capsule.  We then externally rotated and delivered the humeral head out of the wound.  We found the head to be devoid of cartilage.  There was bloody effusion in the shoulder.  We entered the proximal humerus with a 6-mm reamer, reaming it to a  size 10.  We then placed our 10-mm T-handle guide and resected the head to 20 degrees of retroversion with the oscillating saw.  We removed the head at the back table to use for bone graft.  We then removed excess osteophytes off the humerus.  We  subluxed the humerus posteriorly, gaining good exposure for the glenoid.  We removed the capsule and the labrum.  We were careful to protect the axillary nerve.  With our deep retractors placed, we placed our guide pin centered on the glenoid inferiorly  inclined.  We reamed to subchondral bone.  The patient did have a bone defect at the 10 to 12 o'clock position posterosuperiorly, but we felt like we had enough bony support for the Metaglene baseplate.  We then did our peripheral T-handle reamer by  hand.  I then drilled out our central peg hole.  We impacted the HA-coated press-fit baseplate in position.  We placed a 48 screw inferiorly and a 30 screw superiorly at the base of the coracoid.  Both screws with good purchase.  We locked those screws.   Due to the small  glenoid diameter, we were not able to place anterior-posterior screws.  With the baseplate secure, we attached a 38+0 standard glenosphere to the baseplate with the screwdriver.  I did a finger sweep to make sure we had no soft tissue  caught up between the baseplate and the  glenosphere.  Next, we went to the humeral side reaming for the one right metaphysis.  We then trialed with a 10 stem and the 1 right metaphysis set on the 0 setting and placed in 20 degrees of retroversion.  We  trialed with a 38 +3 poly.  We were happy with that soft tissue balancing and stability.  We removed the trial components from the humeral side.  We irrigated thoroughly.  I then used available bone graft and impaction grafting technique to impact the  HA-coated Press-Fit 10 stem with the 1 right metaphysis also HA-coated set on the 0 setting and placed in 20 degrees of retroversion with bone grafting.  The stem was stable.  We selected the real 38+3 polyethylene and placed in the humeral tray and  impacted that.  Reduced the shoulder.  It had nice little polyp as it reduced.  The conjoined tendon was appropriately tight and good stability throughout a full arc of motion.  No impingement.  We irrigated thoroughly.  We then resected the  subscapularis remnant.  We then repaired the deltopectoral interval with 0 Vicryl suture followed by 2-0 Vicryl for subcutaneous closure and 4-0 running Monocryl for skin.  Steri-Strips were applied followed by a sterile dressing.  The patient tolerated  the surgery well.   PUS D: 10/23/2023 9:28:15 am T: 10/23/2023 10:30:00 am  JOB: [id number]/ [id number]

## 2023-10-23 NOTE — Interval H&P Note (Signed)
 History and Physical Interval Note:  10/23/2023 7:31 AM  Kathryn Ayala  has presented today for surgery, with the diagnosis of Right shoulder rotator cuff arthropathy, osteoarthritis.  The various methods of treatment have been discussed with the patient and family. After consideration of risks, benefits and other options for treatment, the patient has consented to  Procedure(s) with comments: REVERSE SHOULDER ARTHROPLASTY (Right) - interscalene block  110 as a surgical intervention.  The patient's history has been reviewed, patient examined, no change in status, stable for surgery.  I have reviewed the patient's chart and labs.  Questions were answered to the patient's satisfaction.     Elspeth JONELLE Her

## 2023-10-23 NOTE — Anesthesia Postprocedure Evaluation (Signed)
 Anesthesia Post Note  Patient: Kathryn Ayala  Procedure(s) Performed: REVERSE SHOULDER ARTHROPLASTY (Right: Shoulder)     Patient location during evaluation: PACU Anesthesia Type: General and Regional Level of consciousness: awake and alert, oriented and patient cooperative Pain management: pain level controlled Vital Signs Assessment: post-procedure vital signs reviewed and stable Respiratory status: spontaneous breathing, nonlabored ventilation and respiratory function stable Cardiovascular status: blood pressure returned to baseline and stable Postop Assessment: no apparent nausea or vomiting Anesthetic complications: no   No notable events documented.  Last Vitals:  Vitals:   10/23/23 0930 10/23/23 1000  BP: (!) 149/77   Pulse: 69   Resp: 18   Temp:    SpO2: 100% 97%    Last Pain:  Vitals:   10/23/23 1000  TempSrc:   PainSc: 0-No pain                 [name]

## 2023-10-23 NOTE — Discharge Instructions (Signed)
 Ice to the shoulder constantly.  Keep the incision covered and clean and dry for one week, then ok to get it wet in the shower. Change to Aquacel bandage on Sunday or Monday. Remove and leave incision uncovered 7 days after surgery (Friday)  Do gentle exercise as instructed several times per day.  DO NOT reach behind your back or push up out of a chair with the operative arm.  Use a sling while you are up and around for comfort, may remove while seated.  Keep pillow propped behind the operative elbow so your arm is in front of you!  Follow up with Dr Kay in two weeks in the office, call [phone] for appt  Please call Dr Kay (cell) [phone] with any questions or concerns

## 2023-10-23 NOTE — Transfer of Care (Signed)
 Immediate Anesthesia Transfer of Care Note  Patient: Kathryn Ayala  Procedure(s) Performed: Procedure(s) with comments: REVERSE SHOULDER ARTHROPLASTY (Right) - interscalene block  110  Patient Location: PACU  Anesthesia Type:General  Level of Consciousness:  sedated, patient cooperative and responds to stimulation  Airway & Oxygen Therapy:Patient Spontanous Breathing and Patient connected to face mask oxgen  Post-op Assessment:  Report given to PACU RN and Post -op Vital signs reviewed and stable  Post vital signs:  Reviewed and stable  Last Vitals:  Vitals:   10/23/23 0619  BP: (!) 152/86  Pulse: 79  Resp: (!) 23  Temp: 37.1 C  SpO2: 97%    Complications: No apparent anesthesia complications

## 2023-10-23 NOTE — Anesthesia Procedure Notes (Addendum)
 Procedure Name: Intubation Date/Time: 10/23/2023 7:50 AM  Performed by: Vincenzo Show, CRNAPre-anesthesia Checklist: Patient identified, Emergency Drugs available, Suction available, Patient being monitored and Timeout performed Patient Re-evaluated:Patient Re-evaluated prior to induction Oxygen Delivery Method: Circle system utilized Preoxygenation: Pre-oxygenation with 100% oxygen Induction Type: IV induction Ventilation: Mask ventilation without difficulty Laryngoscope Size: Mac and 3 Grade View: Grade II Tube type: Oral Tube size: 7.0 mm Number of attempts: 1 Airway Equipment and Method: Stylet Placement Confirmation: ETT inserted through vocal cords under direct vision, positive ETCO2, CO2 detector and breath sounds checked- equal and bilateral Secured at: 21 cm Tube secured with: Tape Dental Injury: Teeth and Oropharynx as per pre-operative assessment  Comments: ATOI

## 2023-10-25 IMAGING — US US EXTREM LOW VENOUS*R*
1 series · 14 of 24 positions shown · non-contrast
Comparison: None.

CLINICAL DATA: Right leg swelling, pain, erythema

EXAM:
RIGHT LOWER EXTREMITY VENOUS DOPPLER ULTRASOUND
TECHNIQUE: Gray-scale sonography with compression, as well as color and duplex
ultrasound, were performed to evaluate the deep venous system(s)
from the level of the common femoral vein through the popliteal and
proximal calf veins.

[Series 1: us venous img lower uni right (dvt) · portal-venous · 14 of 33 slices shown]
[im 1/33]
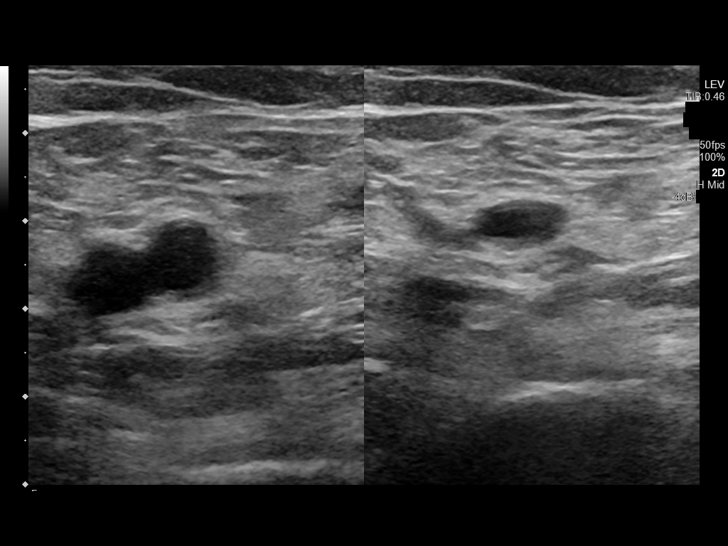
[im 3/33]
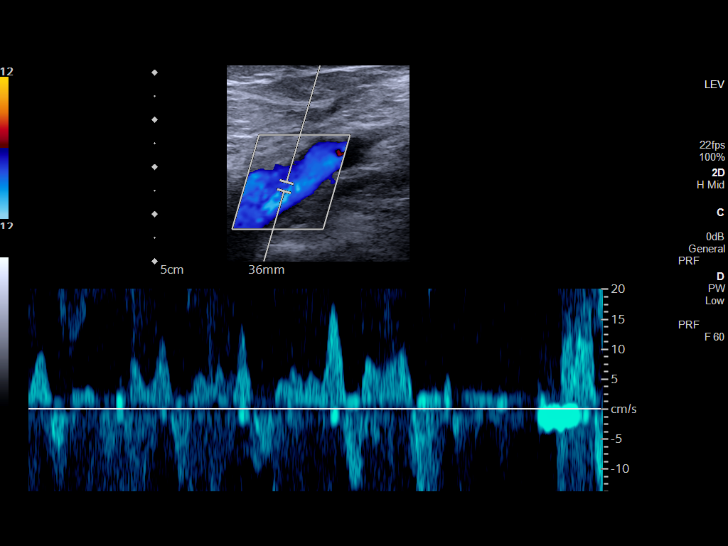
[im 6/33]
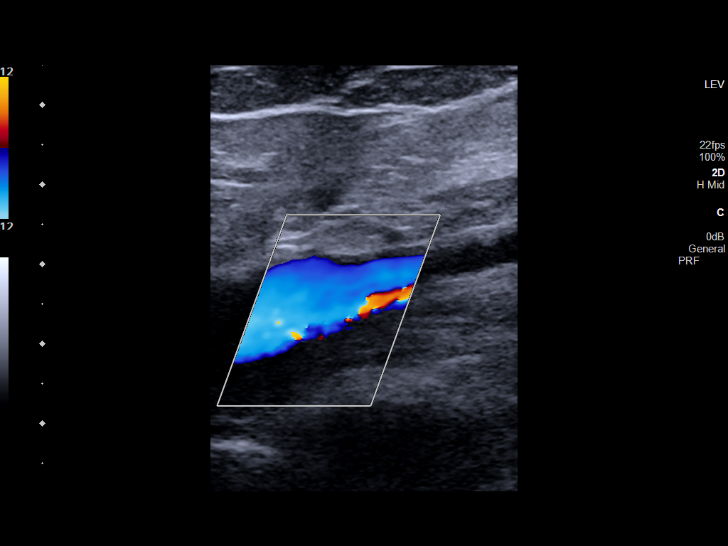
[im 9/33]
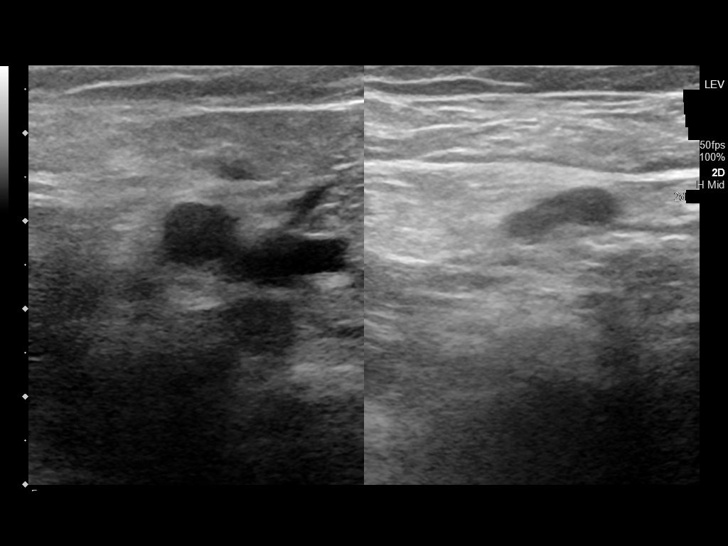
[im 10/33]
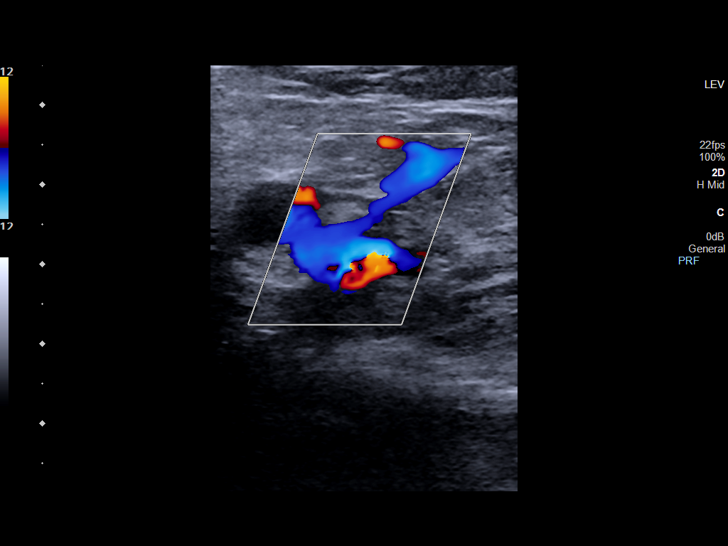
[im 13/33]
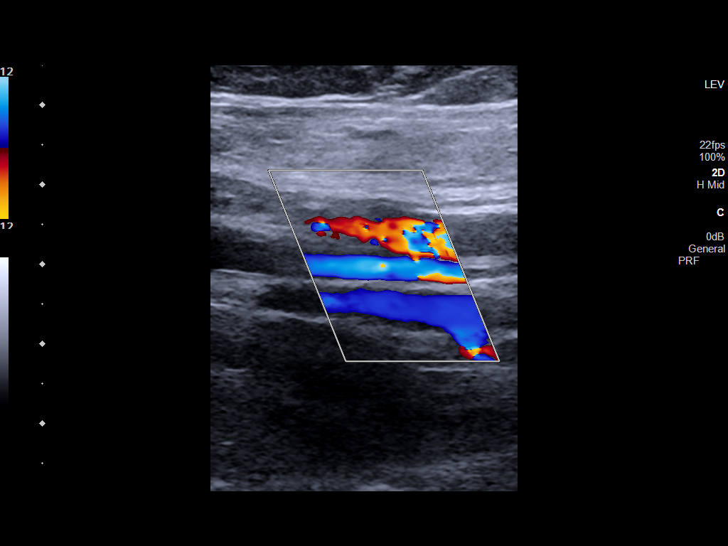
[im 16/33]
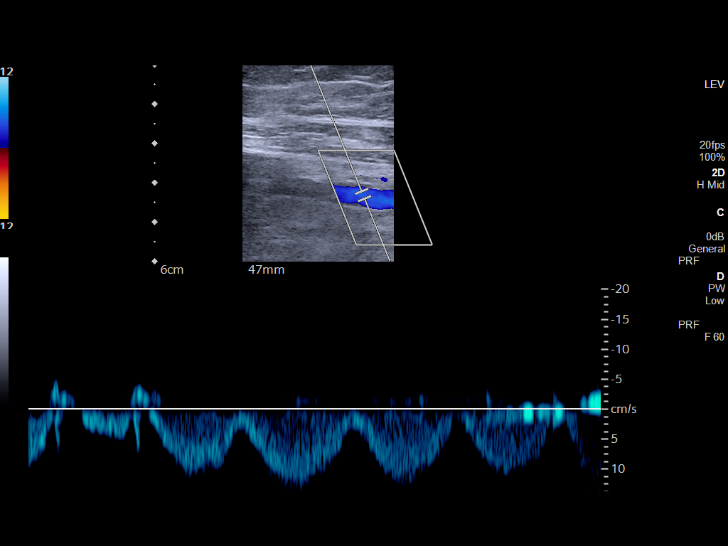
[im 17/33]
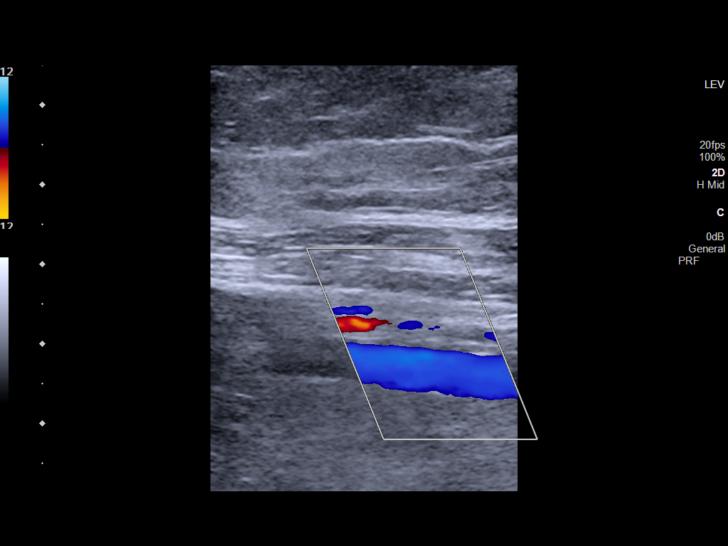
[im 20/33]
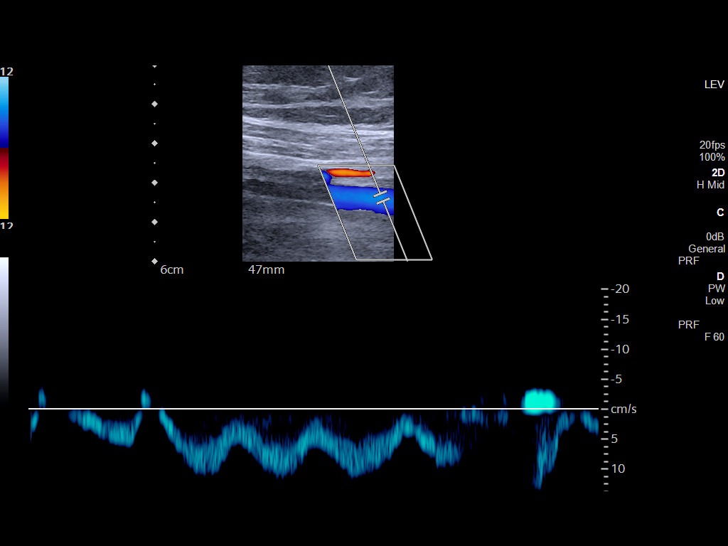
[im 23/33]
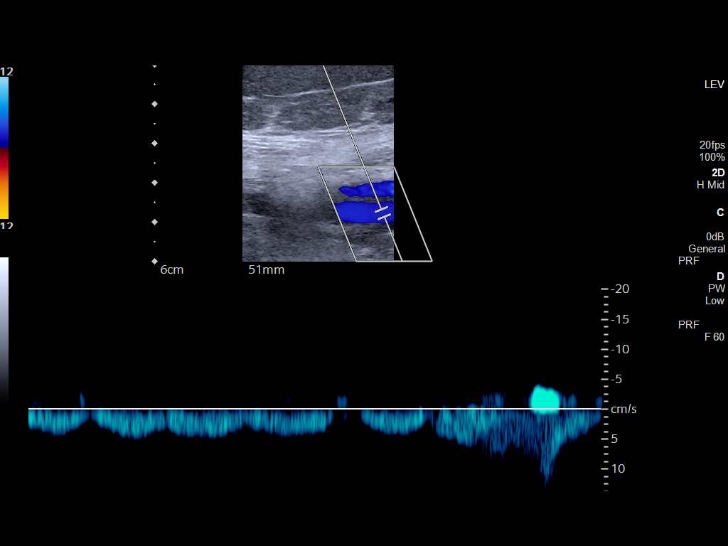
[im 26/33]
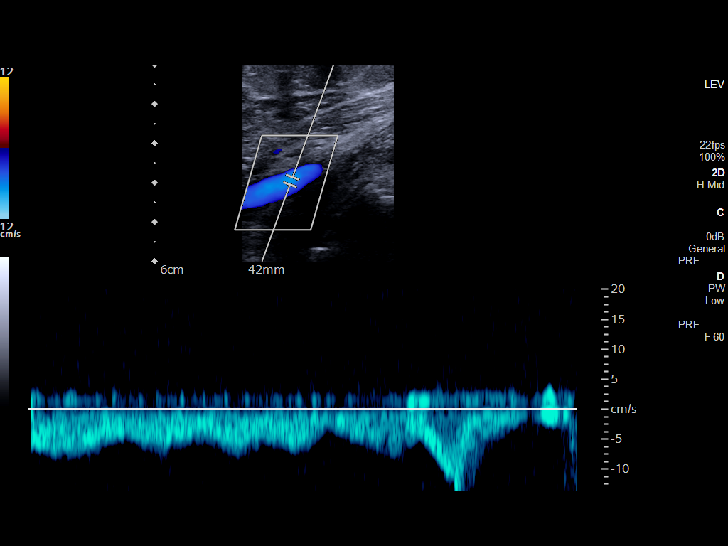
[im 27/33]
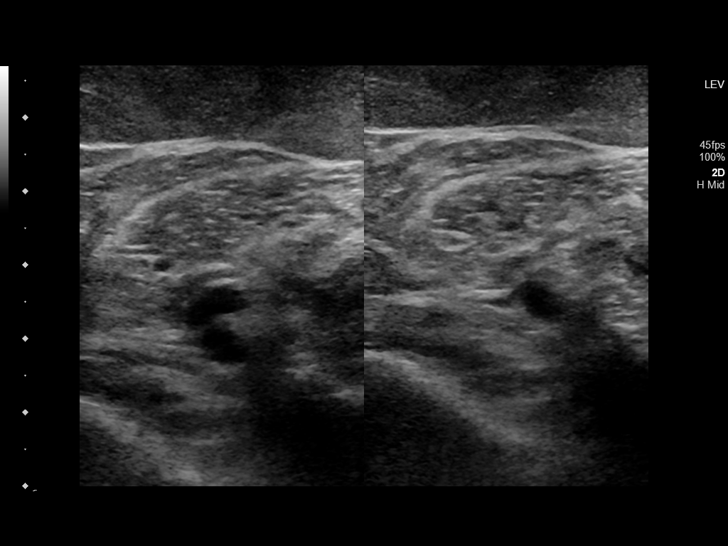
[im 30/33]
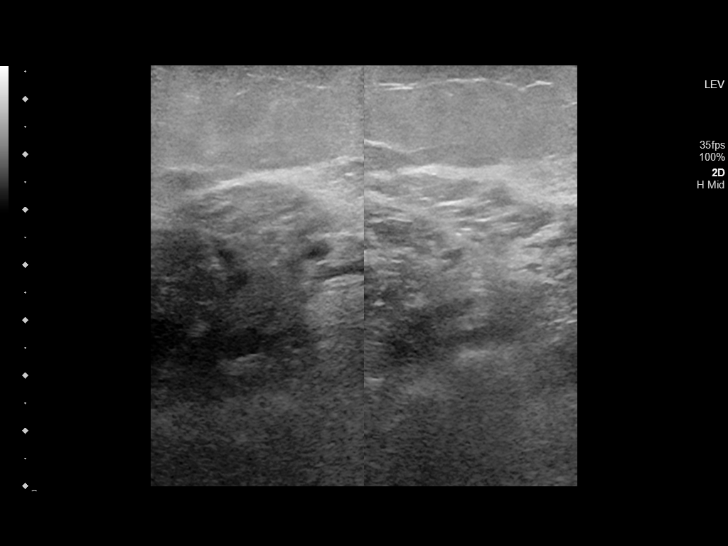
[im 33/33]
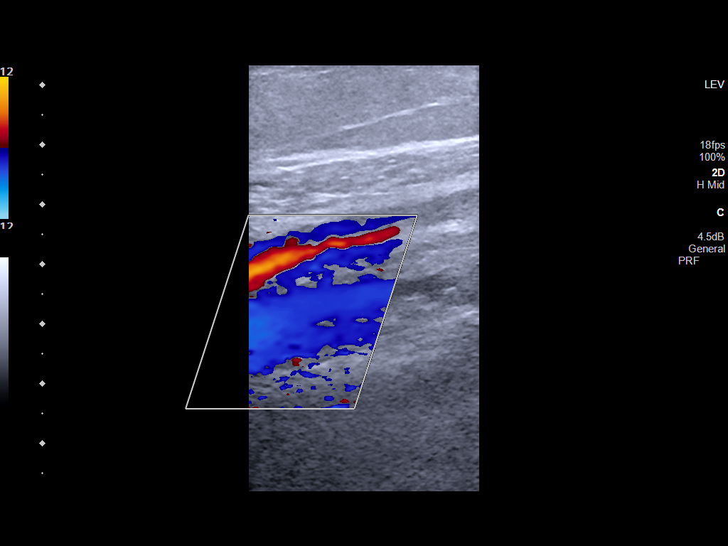

[14 of 24 positions shown; findings below may reference images not displayed]

FINDINGS: VENOUS

Normal compressibility of the common femoral, superficial femoral,
and popliteal veins, as well as the visualized calf veins.
Visualized portions of profunda femoral vein and great saphenous
vein unremarkable. No filling defects to suggest DVT on grayscale or
color Doppler imaging. Doppler waveforms show normal direction of
venous flow, normal respiratory plasticity and response to
augmentation.

Limited views of the contralateral common femoral vein are
unremarkable.

OTHER

None.

Limitations: none
IMPRESSION: Negative.

## 2023-10-26 ENCOUNTER — Encounter (HOSPITAL_COMMUNITY): Payer: Self-pay | Admitting: Orthopedic Surgery

## 2023-11-03 DIAGNOSIS — Z4789 Encounter for other orthopedic aftercare: Secondary | ICD-10-CM | POA: Diagnosis not present

## 2023-12-08 DIAGNOSIS — Z4789 Encounter for other orthopedic aftercare: Secondary | ICD-10-CM | POA: Diagnosis not present

## 2023-12-16 DIAGNOSIS — E785 Hyperlipidemia, unspecified: Secondary | ICD-10-CM | POA: Diagnosis not present

## 2023-12-16 DIAGNOSIS — M8580 Other specified disorders of bone density and structure, unspecified site: Secondary | ICD-10-CM | POA: Diagnosis not present

## 2023-12-16 DIAGNOSIS — E876 Hypokalemia: Secondary | ICD-10-CM | POA: Diagnosis not present

## 2023-12-16 DIAGNOSIS — N1831 Chronic kidney disease, stage 3a: Secondary | ICD-10-CM | POA: Diagnosis not present

## 2023-12-16 DIAGNOSIS — I129 Hypertensive chronic kidney disease with stage 1 through stage 4 chronic kidney disease, or unspecified chronic kidney disease: Secondary | ICD-10-CM | POA: Diagnosis not present

## 2023-12-29 DIAGNOSIS — M25511 Pain in right shoulder: Secondary | ICD-10-CM | POA: Diagnosis not present

## 2023-12-29 DIAGNOSIS — M25611 Stiffness of right shoulder, not elsewhere classified: Secondary | ICD-10-CM | POA: Diagnosis not present

## 2023-12-29 DIAGNOSIS — Z96642 Presence of left artificial hip joint: Secondary | ICD-10-CM | POA: Diagnosis not present

## 2023-12-29 DIAGNOSIS — R293 Abnormal posture: Secondary | ICD-10-CM | POA: Diagnosis not present

## 2023-12-29 DIAGNOSIS — Z96611 Presence of right artificial shoulder joint: Secondary | ICD-10-CM | POA: Diagnosis not present

## 2024-01-01 DIAGNOSIS — M25611 Stiffness of right shoulder, not elsewhere classified: Secondary | ICD-10-CM | POA: Diagnosis not present

## 2024-01-01 DIAGNOSIS — Z96642 Presence of left artificial hip joint: Secondary | ICD-10-CM | POA: Diagnosis not present

## 2024-01-01 DIAGNOSIS — Z96611 Presence of right artificial shoulder joint: Secondary | ICD-10-CM | POA: Diagnosis not present

## 2024-01-01 DIAGNOSIS — R293 Abnormal posture: Secondary | ICD-10-CM | POA: Diagnosis not present

## 2024-01-01 DIAGNOSIS — M25511 Pain in right shoulder: Secondary | ICD-10-CM | POA: Diagnosis not present

## 2024-01-05 DIAGNOSIS — D485 Neoplasm of uncertain behavior of skin: Secondary | ICD-10-CM | POA: Diagnosis not present

## 2024-01-05 DIAGNOSIS — D044 Carcinoma in situ of skin of scalp and neck: Secondary | ICD-10-CM | POA: Diagnosis not present

## 2024-01-06 DIAGNOSIS — Z96611 Presence of right artificial shoulder joint: Secondary | ICD-10-CM | POA: Diagnosis not present

## 2024-01-06 DIAGNOSIS — M25511 Pain in right shoulder: Secondary | ICD-10-CM | POA: Diagnosis not present

## 2024-01-06 DIAGNOSIS — R293 Abnormal posture: Secondary | ICD-10-CM | POA: Diagnosis not present

## 2024-01-06 DIAGNOSIS — M25611 Stiffness of right shoulder, not elsewhere classified: Secondary | ICD-10-CM | POA: Diagnosis not present

## 2024-01-06 DIAGNOSIS — Z96642 Presence of left artificial hip joint: Secondary | ICD-10-CM | POA: Diagnosis not present

## 2024-01-12 DIAGNOSIS — M25611 Stiffness of right shoulder, not elsewhere classified: Secondary | ICD-10-CM | POA: Diagnosis not present

## 2024-01-12 DIAGNOSIS — M25511 Pain in right shoulder: Secondary | ICD-10-CM | POA: Diagnosis not present

## 2024-01-12 DIAGNOSIS — Z96642 Presence of left artificial hip joint: Secondary | ICD-10-CM | POA: Diagnosis not present

## 2024-01-12 DIAGNOSIS — Z96611 Presence of right artificial shoulder joint: Secondary | ICD-10-CM | POA: Diagnosis not present

## 2024-01-12 DIAGNOSIS — R293 Abnormal posture: Secondary | ICD-10-CM | POA: Diagnosis not present

## 2024-01-15 DIAGNOSIS — Z96642 Presence of left artificial hip joint: Secondary | ICD-10-CM | POA: Diagnosis not present

## 2024-01-15 DIAGNOSIS — M25611 Stiffness of right shoulder, not elsewhere classified: Secondary | ICD-10-CM | POA: Diagnosis not present

## 2024-01-15 DIAGNOSIS — R293 Abnormal posture: Secondary | ICD-10-CM | POA: Diagnosis not present

## 2024-01-15 DIAGNOSIS — M25511 Pain in right shoulder: Secondary | ICD-10-CM | POA: Diagnosis not present

## 2024-01-15 DIAGNOSIS — Z96611 Presence of right artificial shoulder joint: Secondary | ICD-10-CM | POA: Diagnosis not present

## 2024-01-18 DIAGNOSIS — Z96611 Presence of right artificial shoulder joint: Secondary | ICD-10-CM | POA: Diagnosis not present

## 2024-01-18 DIAGNOSIS — R293 Abnormal posture: Secondary | ICD-10-CM | POA: Diagnosis not present

## 2024-01-18 DIAGNOSIS — M25511 Pain in right shoulder: Secondary | ICD-10-CM | POA: Diagnosis not present

## 2024-01-18 DIAGNOSIS — M25611 Stiffness of right shoulder, not elsewhere classified: Secondary | ICD-10-CM | POA: Diagnosis not present

## 2024-01-18 DIAGNOSIS — Z96642 Presence of left artificial hip joint: Secondary | ICD-10-CM | POA: Diagnosis not present

## 2024-01-22 DIAGNOSIS — Z96642 Presence of left artificial hip joint: Secondary | ICD-10-CM | POA: Diagnosis not present

## 2024-01-22 DIAGNOSIS — M25611 Stiffness of right shoulder, not elsewhere classified: Secondary | ICD-10-CM | POA: Diagnosis not present

## 2024-01-22 DIAGNOSIS — Z96611 Presence of right artificial shoulder joint: Secondary | ICD-10-CM | POA: Diagnosis not present

## 2024-01-22 DIAGNOSIS — R293 Abnormal posture: Secondary | ICD-10-CM | POA: Diagnosis not present

## 2024-01-22 DIAGNOSIS — M25511 Pain in right shoulder: Secondary | ICD-10-CM | POA: Diagnosis not present

## 2024-01-26 DIAGNOSIS — N1831 Chronic kidney disease, stage 3a: Secondary | ICD-10-CM | POA: Diagnosis not present

## 2024-01-26 DIAGNOSIS — I129 Hypertensive chronic kidney disease with stage 1 through stage 4 chronic kidney disease, or unspecified chronic kidney disease: Secondary | ICD-10-CM | POA: Diagnosis not present

## 2024-01-26 DIAGNOSIS — I480 Paroxysmal atrial fibrillation: Secondary | ICD-10-CM | POA: Diagnosis not present

## 2024-01-26 DIAGNOSIS — D649 Anemia, unspecified: Secondary | ICD-10-CM | POA: Diagnosis not present

## 2024-01-26 DIAGNOSIS — M353 Polymyalgia rheumatica: Secondary | ICD-10-CM | POA: Diagnosis not present

## 2024-01-26 DIAGNOSIS — R600 Localized edema: Secondary | ICD-10-CM | POA: Diagnosis not present

## 2024-01-26 DIAGNOSIS — M8000XA Age-related osteoporosis with current pathological fracture, unspecified site, initial encounter for fracture: Secondary | ICD-10-CM | POA: Diagnosis not present

## 2024-01-26 DIAGNOSIS — I872 Venous insufficiency (chronic) (peripheral): Secondary | ICD-10-CM | POA: Diagnosis not present

## 2024-01-26 DIAGNOSIS — E785 Hyperlipidemia, unspecified: Secondary | ICD-10-CM | POA: Diagnosis not present

## 2024-01-26 DIAGNOSIS — D6869 Other thrombophilia: Secondary | ICD-10-CM | POA: Diagnosis not present

## 2024-01-26 DIAGNOSIS — E876 Hypokalemia: Secondary | ICD-10-CM | POA: Diagnosis not present

## 2024-01-28 DIAGNOSIS — M25511 Pain in right shoulder: Secondary | ICD-10-CM | POA: Diagnosis not present

## 2024-01-28 DIAGNOSIS — Z96642 Presence of left artificial hip joint: Secondary | ICD-10-CM | POA: Diagnosis not present

## 2024-01-28 DIAGNOSIS — Z96611 Presence of right artificial shoulder joint: Secondary | ICD-10-CM | POA: Diagnosis not present

## 2024-01-28 DIAGNOSIS — M25611 Stiffness of right shoulder, not elsewhere classified: Secondary | ICD-10-CM | POA: Diagnosis not present

## 2024-01-28 DIAGNOSIS — R293 Abnormal posture: Secondary | ICD-10-CM | POA: Diagnosis not present

## 2024-03-15 DIAGNOSIS — E559 Vitamin D deficiency, unspecified: Secondary | ICD-10-CM | POA: Diagnosis not present

## 2024-03-15 DIAGNOSIS — R7301 Impaired fasting glucose: Secondary | ICD-10-CM | POA: Diagnosis not present

## 2024-03-15 DIAGNOSIS — D649 Anemia, unspecified: Secondary | ICD-10-CM | POA: Diagnosis not present

## 2024-03-15 DIAGNOSIS — I129 Hypertensive chronic kidney disease with stage 1 through stage 4 chronic kidney disease, or unspecified chronic kidney disease: Secondary | ICD-10-CM | POA: Diagnosis not present

## 2024-03-15 DIAGNOSIS — M8000XA Age-related osteoporosis with current pathological fracture, unspecified site, initial encounter for fracture: Secondary | ICD-10-CM | POA: Diagnosis not present

## 2024-03-15 DIAGNOSIS — E785 Hyperlipidemia, unspecified: Secondary | ICD-10-CM | POA: Diagnosis not present

## 2024-03-15 DIAGNOSIS — N1831 Chronic kidney disease, stage 3a: Secondary | ICD-10-CM | POA: Diagnosis not present

## 2024-03-15 DIAGNOSIS — Z1212 Encounter for screening for malignant neoplasm of rectum: Secondary | ICD-10-CM | POA: Diagnosis not present

## 2024-03-15 DIAGNOSIS — M109 Gout, unspecified: Secondary | ICD-10-CM | POA: Diagnosis not present

## 2024-03-22 DIAGNOSIS — D6869 Other thrombophilia: Secondary | ICD-10-CM | POA: Diagnosis not present

## 2024-03-22 DIAGNOSIS — I480 Paroxysmal atrial fibrillation: Secondary | ICD-10-CM | POA: Diagnosis not present

## 2024-03-22 DIAGNOSIS — N1831 Chronic kidney disease, stage 3a: Secondary | ICD-10-CM | POA: Diagnosis not present

## 2024-03-22 DIAGNOSIS — M8000XA Age-related osteoporosis with current pathological fracture, unspecified site, initial encounter for fracture: Secondary | ICD-10-CM | POA: Diagnosis not present

## 2024-03-22 DIAGNOSIS — D0512 Intraductal carcinoma in situ of left breast: Secondary | ICD-10-CM | POA: Diagnosis not present

## 2024-03-22 DIAGNOSIS — I739 Peripheral vascular disease, unspecified: Secondary | ICD-10-CM | POA: Diagnosis not present

## 2024-03-22 DIAGNOSIS — R82998 Other abnormal findings in urine: Secondary | ICD-10-CM | POA: Diagnosis not present

## 2024-03-22 DIAGNOSIS — Z0000 Encounter for general adult medical examination without abnormal findings: Secondary | ICD-10-CM | POA: Diagnosis not present

## 2024-03-22 DIAGNOSIS — I129 Hypertensive chronic kidney disease with stage 1 through stage 4 chronic kidney disease, or unspecified chronic kidney disease: Secondary | ICD-10-CM | POA: Diagnosis not present

## 2024-03-22 DIAGNOSIS — I10 Essential (primary) hypertension: Secondary | ICD-10-CM | POA: Diagnosis not present

## 2024-03-29 ENCOUNTER — Other Ambulatory Visit (HOSPITAL_COMMUNITY): Payer: Self-pay

## 2024-03-30 ENCOUNTER — Ambulatory Visit (HOSPITAL_COMMUNITY)
Admission: RE | Admit: 2024-03-30 | Discharge: 2024-03-30 | Disposition: A | Discharge status: Home / Self Care | Source: Ambulatory Visit | Attending: Internal Medicine | Admitting: Internal Medicine

## 2024-03-30 DIAGNOSIS — M810 Age-related osteoporosis without current pathological fracture: Secondary | ICD-10-CM | POA: Insufficient documentation

## 2024-03-30 MED ORDER — DENOSUMAB 60 MG/ML ~~LOC~~ SOSY
60.0000 mg | PREFILLED_SYRINGE | Freq: Once | SUBCUTANEOUS | Status: AC
  Administered 2024-03-30: 60 mg via SUBCUTANEOUS

## 2024-03-30 MED ORDER — DENOSUMAB 60 MG/ML ~~LOC~~ SOSY
PREFILLED_SYRINGE | SUBCUTANEOUS | Status: AC
  Filled 2024-03-30: qty 1

## 2024-04-06 DIAGNOSIS — D485 Neoplasm of uncertain behavior of skin: Secondary | ICD-10-CM | POA: Diagnosis not present

## 2024-04-06 DIAGNOSIS — L905 Scar conditions and fibrosis of skin: Secondary | ICD-10-CM | POA: Diagnosis not present

## 2024-04-06 DIAGNOSIS — Z85828 Personal history of other malignant neoplasm of skin: Secondary | ICD-10-CM | POA: Diagnosis not present

## 2024-04-06 DIAGNOSIS — L218 Other seborrheic dermatitis: Secondary | ICD-10-CM | POA: Diagnosis not present

## 2024-04-06 DIAGNOSIS — D044 Carcinoma in situ of skin of scalp and neck: Secondary | ICD-10-CM | POA: Diagnosis not present

## 2024-04-08 DIAGNOSIS — I480 Paroxysmal atrial fibrillation: Secondary | ICD-10-CM | POA: Diagnosis not present

## 2024-04-08 DIAGNOSIS — J029 Acute pharyngitis, unspecified: Secondary | ICD-10-CM | POA: Diagnosis not present

## 2024-04-08 DIAGNOSIS — I130 Hypertensive heart and chronic kidney disease with heart failure and stage 1 through stage 4 chronic kidney disease, or unspecified chronic kidney disease: Secondary | ICD-10-CM | POA: Diagnosis not present

## 2024-04-08 DIAGNOSIS — Z1152 Encounter for screening for COVID-19: Secondary | ICD-10-CM | POA: Diagnosis not present

## 2024-04-08 DIAGNOSIS — R0981 Nasal congestion: Secondary | ICD-10-CM | POA: Diagnosis not present

## 2024-04-08 DIAGNOSIS — R059 Cough, unspecified: Secondary | ICD-10-CM | POA: Diagnosis not present

## 2024-04-08 DIAGNOSIS — R0602 Shortness of breath: Secondary | ICD-10-CM | POA: Diagnosis not present

## 2024-04-08 DIAGNOSIS — R5383 Other fatigue: Secondary | ICD-10-CM | POA: Diagnosis not present

## 2024-04-08 DIAGNOSIS — J189 Pneumonia, unspecified organism: Secondary | ICD-10-CM | POA: Diagnosis not present

## 2024-04-14 DIAGNOSIS — Z4789 Encounter for other orthopedic aftercare: Secondary | ICD-10-CM | POA: Diagnosis not present

## 2024-04-18 DIAGNOSIS — D044 Carcinoma in situ of skin of scalp and neck: Secondary | ICD-10-CM | POA: Diagnosis not present

## 2024-04-25 DIAGNOSIS — R82998 Other abnormal findings in urine: Secondary | ICD-10-CM | POA: Diagnosis not present

## 2024-07-21 DIAGNOSIS — L570 Actinic keratosis: Secondary | ICD-10-CM | POA: Diagnosis not present

## 2024-07-21 DIAGNOSIS — Z85828 Personal history of other malignant neoplasm of skin: Secondary | ICD-10-CM | POA: Diagnosis not present

## 2024-07-27 ENCOUNTER — Emergency Department (HOSPITAL_COMMUNITY)

## 2024-07-27 ENCOUNTER — Other Ambulatory Visit: Payer: Self-pay

## 2024-07-27 ENCOUNTER — Inpatient Hospital Stay (HOSPITAL_COMMUNITY)
Admission: EM | Admit: 2024-07-27 | Discharge: 2024-07-30 | DRG: 309 | Disposition: A | Discharge status: Home / Self Care | Attending: Internal Medicine | Admitting: Internal Medicine

## 2024-07-27 DIAGNOSIS — M797 Fibromyalgia: Secondary | ICD-10-CM | POA: Diagnosis present

## 2024-07-27 DIAGNOSIS — E785 Hyperlipidemia, unspecified: Secondary | ICD-10-CM | POA: Diagnosis present

## 2024-07-27 DIAGNOSIS — F32A Depression, unspecified: Secondary | ICD-10-CM | POA: Diagnosis not present

## 2024-07-27 DIAGNOSIS — Z885 Allergy status to narcotic agent status: Secondary | ICD-10-CM

## 2024-07-27 DIAGNOSIS — K5732 Diverticulitis of large intestine without perforation or abscess without bleeding: Secondary | ICD-10-CM | POA: Diagnosis present

## 2024-07-27 DIAGNOSIS — Z90710 Acquired absence of both cervix and uterus: Secondary | ICD-10-CM | POA: Diagnosis not present

## 2024-07-27 DIAGNOSIS — R519 Headache, unspecified: Secondary | ICD-10-CM | POA: Diagnosis not present

## 2024-07-27 DIAGNOSIS — R350 Frequency of micturition: Secondary | ICD-10-CM | POA: Diagnosis not present

## 2024-07-27 DIAGNOSIS — H9191 Unspecified hearing loss, right ear: Secondary | ICD-10-CM | POA: Diagnosis present

## 2024-07-27 DIAGNOSIS — I1310 Hypertensive heart and chronic kidney disease without heart failure, with stage 1 through stage 4 chronic kidney disease, or unspecified chronic kidney disease: Secondary | ICD-10-CM | POA: Diagnosis present

## 2024-07-27 DIAGNOSIS — I110 Hypertensive heart disease with heart failure: Secondary | ICD-10-CM | POA: Diagnosis not present

## 2024-07-27 DIAGNOSIS — R110 Nausea: Secondary | ICD-10-CM | POA: Diagnosis present

## 2024-07-27 DIAGNOSIS — N1831 Chronic kidney disease, stage 3a: Secondary | ICD-10-CM | POA: Diagnosis not present

## 2024-07-27 DIAGNOSIS — Z880 Allergy status to penicillin: Secondary | ICD-10-CM

## 2024-07-27 DIAGNOSIS — Z801 Family history of malignant neoplasm of trachea, bronchus and lung: Secondary | ICD-10-CM | POA: Diagnosis not present

## 2024-07-27 DIAGNOSIS — I890 Lymphedema, not elsewhere classified: Secondary | ICD-10-CM | POA: Diagnosis present

## 2024-07-27 DIAGNOSIS — Z79899 Other long term (current) drug therapy: Secondary | ICD-10-CM | POA: Diagnosis not present

## 2024-07-27 DIAGNOSIS — Z853 Personal history of malignant neoplasm of breast: Secondary | ICD-10-CM

## 2024-07-27 DIAGNOSIS — Z7901 Long term (current) use of anticoagulants: Secondary | ICD-10-CM

## 2024-07-27 DIAGNOSIS — F419 Anxiety disorder, unspecified: Secondary | ICD-10-CM | POA: Diagnosis not present

## 2024-07-27 DIAGNOSIS — Z8673 Personal history of transient ischemic attack (TIA), and cerebral infarction without residual deficits: Secondary | ICD-10-CM

## 2024-07-27 DIAGNOSIS — R1032 Left lower quadrant pain: Secondary | ICD-10-CM | POA: Diagnosis not present

## 2024-07-27 DIAGNOSIS — Z823 Family history of stroke: Secondary | ICD-10-CM

## 2024-07-27 DIAGNOSIS — N3289 Other specified disorders of bladder: Secondary | ICD-10-CM | POA: Diagnosis not present

## 2024-07-27 DIAGNOSIS — R000 Tachycardia, unspecified: Secondary | ICD-10-CM | POA: Diagnosis not present

## 2024-07-27 DIAGNOSIS — E876 Hypokalemia: Secondary | ICD-10-CM | POA: Diagnosis present

## 2024-07-27 DIAGNOSIS — K5792 Diverticulitis of intestine, part unspecified, without perforation or abscess without bleeding: Principal | ICD-10-CM

## 2024-07-27 DIAGNOSIS — I4821 Permanent atrial fibrillation: Principal | ICD-10-CM | POA: Diagnosis present

## 2024-07-27 DIAGNOSIS — Z96653 Presence of artificial knee joint, bilateral: Secondary | ICD-10-CM | POA: Diagnosis present

## 2024-07-27 DIAGNOSIS — Z96642 Presence of left artificial hip joint: Secondary | ICD-10-CM | POA: Diagnosis present

## 2024-07-27 DIAGNOSIS — I4891 Unspecified atrial fibrillation: Secondary | ICD-10-CM | POA: Diagnosis not present

## 2024-07-27 DIAGNOSIS — Z888 Allergy status to other drugs, medicaments and biological substances status: Secondary | ICD-10-CM | POA: Diagnosis not present

## 2024-07-27 DIAGNOSIS — Z87892 Personal history of anaphylaxis: Secondary | ICD-10-CM

## 2024-07-27 DIAGNOSIS — Z96611 Presence of right artificial shoulder joint: Secondary | ICD-10-CM | POA: Diagnosis present

## 2024-07-27 DIAGNOSIS — Z882 Allergy status to sulfonamides status: Secondary | ICD-10-CM

## 2024-07-27 DIAGNOSIS — Z87442 Personal history of urinary calculi: Secondary | ICD-10-CM

## 2024-07-27 DIAGNOSIS — I509 Heart failure, unspecified: Secondary | ICD-10-CM

## 2024-07-27 DIAGNOSIS — R0602 Shortness of breath: Secondary | ICD-10-CM | POA: Diagnosis not present

## 2024-07-27 LAB — URINALYSIS, W/ REFLEX TO CULTURE (INFECTION SUSPECTED)
Bacteria, UA: NONE SEEN
Bilirubin Urine: NEGATIVE
Glucose, UA: NEGATIVE mg/dL
Ketones, ur: NEGATIVE mg/dL
Nitrite: NEGATIVE
Protein, ur: 30 mg/dL — AB
Specific Gravity, Urine: 1.01 (ref 1.005–1.030)
pH: 5 (ref 5.0–8.0)

## 2024-07-27 LAB — I-STAT CHEM 8, ED
BUN: 10 mg/dL (ref 8–23)
BUN: 17 mg/dL (ref 8–23)
Calcium, Ion: 1.1 mmol/L — ABNORMAL LOW (ref 1.15–1.40)
Calcium, Ion: 1.22 mmol/L (ref 1.15–1.40)
Chloride: 102 mmol/L (ref 98–111)
Chloride: 105 mmol/L (ref 98–111)
Creatinine, Ser: 0.6 mg/dL (ref 0.44–1.00)
Creatinine, Ser: 0.6 mg/dL (ref 0.44–1.00)
Glucose, Bld: 125 mg/dL — ABNORMAL HIGH (ref 70–99)
Glucose, Bld: 133 mg/dL — ABNORMAL HIGH (ref 70–99)
HCT: 40 % (ref 36.0–46.0)
HCT: 43 % (ref 36.0–46.0)
Hemoglobin: 13.6 g/dL (ref 12.0–15.0)
Hemoglobin: 14.6 g/dL (ref 12.0–15.0)
Potassium: 3.4 mmol/L — ABNORMAL LOW (ref 3.5–5.1)
Potassium: 7.1 mmol/L (ref 3.5–5.1)
Sodium: 133 mmol/L — ABNORMAL LOW (ref 135–145)
Sodium: 137 mmol/L (ref 135–145)
TCO2: 23 mmol/L (ref 22–32)
TCO2: 23 mmol/L (ref 22–32)

## 2024-07-27 LAB — COMPREHENSIVE METABOLIC PANEL WITH GFR
ALT: 14 U/L (ref 0–44)
AST: 19 U/L (ref 15–41)
Albumin: 3.5 g/dL (ref 3.5–5.0)
Alkaline Phosphatase: 96 U/L (ref 38–126)
Anion gap: 16 — ABNORMAL HIGH (ref 5–15)
BUN: 10 mg/dL (ref 8–23)
CO2: 20 mmol/L — ABNORMAL LOW (ref 22–32)
Calcium: 8.3 mg/dL — ABNORMAL LOW (ref 8.9–10.3)
Chloride: 107 mmol/L (ref 98–111)
Creatinine, Ser: 0.52 mg/dL (ref 0.44–1.00)
GFR, Estimated: >60 mL/min (ref >60)
Glucose, Bld: 110 mg/dL — ABNORMAL HIGH (ref 70–99)
Potassium: 2.9 mmol/L — ABNORMAL LOW (ref 3.5–5.1)
Sodium: 144 mmol/L (ref 135–145)
Total Bilirubin: 0.9 mg/dL (ref 0.0–1.2)
Total Protein: 6.2 g/dL — ABNORMAL LOW (ref 6.5–8.1)

## 2024-07-27 LAB — CBC WITH DIFFERENTIAL/PLATELET
Abs Immature Granulocytes: 0.04 K/uL (ref 0.00–0.07)
Basophils Absolute: 0 K/uL (ref 0.0–0.1)
Basophils Relative: 0 %
Eosinophils Absolute: 0.1 K/uL (ref 0.0–0.5)
Eosinophils Relative: 1 %
HCT: 46.3 % — ABNORMAL HIGH (ref 36.0–46.0)
Hemoglobin: 14.8 g/dL (ref 12.0–15.0)
Immature Granulocytes: 0 %
Lymphocytes Relative: 13 %
Lymphs Abs: 1.5 K/uL (ref 0.7–4.0)
MCH: 31 pg (ref 26.0–34.0)
MCHC: 32 g/dL (ref 30.0–36.0)
MCV: 96.9 fL (ref 80.0–100.0)
Monocytes Absolute: 1 K/uL (ref 0.1–1.0)
Monocytes Relative: 8 %
Neutro Abs: 9.3 K/uL — ABNORMAL HIGH (ref 1.7–7.7)
Neutrophils Relative %: 78 %
Platelets: 216 K/uL (ref 150–400)
RBC: 4.78 MIL/uL (ref 3.87–5.11)
RDW: 14.2 % (ref 11.5–15.5)
WBC: 11.9 K/uL — ABNORMAL HIGH (ref 4.0–10.5)
nRBC: 0 % (ref 0.0–0.2)

## 2024-07-27 LAB — TROPONIN T, HIGH SENSITIVITY: Troponin T High Sensitivity: 15 ng/L (ref 0–19)

## 2024-07-27 LAB — PRO BRAIN NATRIURETIC PEPTIDE: Pro Brain Natriuretic Peptide: 1330 pg/mL — ABNORMAL HIGH (ref <300.0)

## 2024-07-27 MED ORDER — POTASSIUM CHLORIDE 10 MEQ/100ML IV SOLN
10.0000 meq | INTRAVENOUS | Status: AC
  Administered 2024-07-28 (×4): 10 meq via INTRAVENOUS
  Filled 2024-07-27 (×4): qty 100

## 2024-07-27 MED ORDER — CEFTRIAXONE SODIUM 1 G IJ SOLR
1.0000 g | Freq: Once | INTRAMUSCULAR | Status: AC
  Administered 2024-07-27: 1 g via INTRAVENOUS
  Filled 2024-07-27: qty 10

## 2024-07-27 MED ORDER — DILTIAZEM HCL 25 MG/5ML IV SOLN
10.0000 mg | Freq: Once | INTRAVENOUS | Status: AC
  Administered 2024-07-27: 10 mg via INTRAVENOUS
  Filled 2024-07-27: qty 5

## 2024-07-27 MED ORDER — METOCLOPRAMIDE HCL 5 MG/ML IJ SOLN
10.0000 mg | Freq: Once | INTRAMUSCULAR | Status: AC
  Administered 2024-07-27: 10 mg via INTRAVENOUS
  Filled 2024-07-27: qty 2

## 2024-07-27 MED ORDER — METRONIDAZOLE 500 MG/100ML IV SOLN
500.0000 mg | Freq: Once | INTRAVENOUS | Status: AC
  Administered 2024-07-27: 500 mg via INTRAVENOUS
  Filled 2024-07-27: qty 100

## 2024-07-27 MED ORDER — IOHEXOL 300 MG/ML  SOLN
100.0000 mL | Freq: Once | INTRAMUSCULAR | Status: AC | PRN
  Administered 2024-07-27: 100 mL via INTRAVENOUS

## 2024-07-27 MED ORDER — FENTANYL CITRATE PF 50 MCG/ML IJ SOSY
50.0000 ug | PREFILLED_SYRINGE | Freq: Once | INTRAMUSCULAR | Status: AC
  Administered 2024-07-28: 50 ug via INTRAVENOUS
  Filled 2024-07-27: qty 1

## 2024-07-27 MED ORDER — CALCIUM GLUCONATE-NACL 1-0.675 GM/50ML-% IV SOLN
1.0000 g | Freq: Once | INTRAVENOUS | Status: DC
  Filled 2024-07-27: qty 50

## 2024-07-27 MED ORDER — DILTIAZEM HCL-DEXTROSE 125-5 MG/125ML-% IV SOLN (PREMIX)
5.0000 mg/h | INTRAVENOUS | Status: DC
  Administered 2024-07-27: 5 mg/h via INTRAVENOUS
  Administered 2024-07-28: 15 mg/h via INTRAVENOUS
  Filled 2024-07-27 (×2): qty 125

## 2024-07-27 MED ORDER — DIPHENHYDRAMINE HCL 50 MG/ML IJ SOLN
25.0000 mg | Freq: Once | INTRAMUSCULAR | Status: AC
  Administered 2024-07-27: 25 mg via INTRAVENOUS
  Filled 2024-07-27: qty 1

## 2024-07-27 NOTE — ED Triage Notes (Signed)
 Pt presents feeling that her heart has been racing since last night.  Hx of afib. On eliquis .  Pt reports she has had LLQ pain with frequent urination for 2 weeks as well.  No chest pain. Does feel somewhat shob.  In nad at time of triage.

## 2024-07-27 NOTE — ED Notes (Signed)
 Pt. I-stat Chem 8 results potassium 7.1, MD, Patt made aware.

## 2024-07-27 NOTE — ED Provider Notes (Signed)
 Kathryn Ayala EMERGENCY DEPARTMENT AT [facility] Provider Note   CSN: [id number] Arrival date & time: 07/27/24  1925     Patient presents with: Tachycardia   Kathryn Ayala is a 89 y.o. female history of A-fib on Eliquis , CKD, anemia who presenting with shortness of breath and abdominal pain.  Patient states that she has been shortness of breath and palpitations since yesterday.  Patient was noted to be in rapid A-fib on arrival.  Patient has a history of A-fib and taking Eliquis .  Patient states that for the last 2 weeks she has been having lower abdominal pain and some dysuria.     The history is provided by the patient.       Prior to Admission medications   Medication Sig Start Date End Date Taking? Authorizing Provider  acetaminophen  (TYLENOL ) 500 MG tablet Take 1,000 mg by mouth every 6 (six) hours as needed for moderate pain.    [provider]  allopurinol  (ZYLOPRIM ) 100 MG tablet Take 100 mg by mouth 2 (two) times daily.  03/04/16   [provider]  apixaban  (ELIQUIS ) 2.5 MG TABS tablet Take 1 tablet (2.5 mg total) by mouth 2 (two) times daily. 09/23/23   Ladona Heinz, MD  atenolol  (TENORMIN ) 50 MG tablet Take 50 mg by mouth 2 (two) times daily. 05/05/16   [provider]  B Complex-C (B-COMPLEX WITH VITAMIN C) tablet Take 1 tablet by mouth daily. 11/12/22   Patel, Pranav M, MD  Biotin (BIOTIN 5000) 5 MG CAPS Take 5 mg by mouth in the morning.    [provider]  Cholecalciferol (VITAMIN D3) 50 MCG (2000 UT) CAPS Take 2,000 Units by mouth in the morning.    [provider]  ferrous sulfate  325 (65 FE) MG tablet Take 1 tablet (325 mg total) by mouth daily with breakfast. 11/11/22 10/06/25  Tobie Yetta HERO, MD  loratadine  (CLARITIN ) 10 MG tablet Take 10 mg by mouth in the morning.    [provider]  Melatonin 10 MG TABS Take 10 mg by mouth at bedtime.    [provider]  rosuvastatin  (CRESTOR ) 5 MG tablet Take 5 mg  by mouth in the morning.    [provider]  traMADol  (ULTRAM ) 50 MG tablet Take 1 tablet (50 mg total) by mouth every 6 (six) hours as needed for moderate pain (pain score 4-6) or severe pain (pain score 7-10). 10/23/23 10/22/24  Kay Kemps, MD  venlafaxine  XR (EFFEXOR -XR) 37.5 MG 24 hr capsule Take 37.5 mg by mouth in the morning.    [provider]    Allergies: Hydrocodone , Penicillins, Azithromycin, Codeine, Levofloxacin, and Sulfa antibiotics    Review of Systems  Cardiovascular:  Positive for palpitations.  Gastrointestinal:  Positive for abdominal pain.  All other systems reviewed and are negative.   Updated Vital Signs BP (!) 195/84 (BP Location: Left Arm)   Pulse (!) 113   Temp 99 F (37.2 C) (Oral)   Resp (!) 24   SpO2 97%   Physical Exam Vitals and nursing note reviewed.  HENT:     Head: Normocephalic.     Nose: Nose normal.     Mouth/Throat:     Mouth: Mucous membranes are moist.  Eyes:     Extraocular Movements: Extraocular movements intact.     Pupils: Pupils are equal, round, and reactive to light.  Cardiovascular:     Rate and Rhythm: Tachycardia present. Rhythm irregular.     Pulses: Normal pulses.  Pulmonary:     Comments: Diminished bilateral bases Abdominal:     Comments: Left lower quadrant tenderness  Musculoskeletal:     Cervical back: Normal range of motion and neck supple.     Comments: 1+ pitting edema bilaterally  Skin:    Capillary Refill: Capillary refill takes less than 2 seconds.  Neurological:     General: No focal deficit present.     Mental Status: She is alert and oriented to person, place, and time.  Psychiatric:        Mood and Affect: Mood normal.        Behavior: Behavior normal.     (all labs ordered are listed, but only abnormal results are displayed) Labs Reviewed  CBC WITH DIFFERENTIAL/PLATELET - Abnormal; Notable for the following components:      Result Value   WBC 11.9 (*)    HCT 46.3 (*)     Neutro Abs 9.3 (*)    All other components within normal limits  URINALYSIS, W/ REFLEX TO CULTURE (INFECTION SUSPECTED) - Abnormal; Notable for the following components:   Hgb urine dipstick SMALL (*)    Protein, ur 30 (*)    Leukocytes,Ua MODERATE (*)    All other components within normal limits  PRO BRAIN NATRIURETIC PEPTIDE - Abnormal; Notable for the following components:   Pro Brain Natriuretic Peptide 1,330.0 (*)    All other components within normal limits  I-STAT CHEM 8, ED - Abnormal; Notable for the following components:   Sodium 133 (*)    Potassium 7.1 (*)    Glucose, Bld 125 (*)    Calcium , Ion 1.10 (*)    All other components within normal limits  URINE CULTURE  COMPREHENSIVE METABOLIC PANEL WITH GFR  PROTIME-INR  COMPREHENSIVE METABOLIC PANEL WITH GFR  TROPONIN T, HIGH SENSITIVITY  TROPONIN T, HIGH SENSITIVITY    EKG: EKG Interpretation Date/Time:  Wednesday July 27 2024 20:24:46 EDT Ventricular Rate:  115 PR Interval:    QRS Duration:  78 QT Interval:  335 QTC Calculation: 464 R Axis:   118  Text Interpretation: Atrial fibrillation Right axis deviation Borderline repol abnormality, diffuse leads No significant change since last tracing Confirmed by Patt Alm DEL [phone]) on 07/27/2024 8:43:52 PM  Radiology: [facility] Chest 2 View Result Date: 07/27/2024 CLINICAL DATA:  Shortness of breath. Heart racing since last night. History of atrial fibrillation on Eliquis . Left lower quadrant pain with frequent urination for 2 weeks. EXAM: CHEST - 2 VIEW COMPARISON:  01/04/2023 FINDINGS: Cardiac enlargement with mild perihilar infiltration, possibly due to edema. No pleural effusion or pneumothorax. No focal consolidation. Mediastinal contours appear intact. Degenerative changes in the spine. Postoperative change in the right shoulder. IMPRESSION: Cardiac enlargement with evidence of mild perihilar edema. Electronically Signed   By: Elsie Gravely M.D.   On: 07/27/2024 20:07      Procedures   CRITICAL CARE Performed by: Alm DEL Patt   Total critical care time: 39 minutes  Critical care time was exclusive of separately billable procedures and treating other patients.  Critical care was necessary to treat or prevent imminent or life-threatening deterioration.  Critical care was time spent personally by me on the following activities: development of treatment plan with patient and/or surrogate as well as nursing, discussions with consultants, evaluation of patient's response to treatment, examination of patient, obtaining history from patient or surrogate, ordering and performing treatments and interventions, ordering and review of laboratory studies, ordering and review of radiographic studies, pulse oximetry and  re-evaluation of patient's condition.   Medications Ordered in the ED  calcium  gluconate 1 g/ 50 mL sodium chloride  IVPB (0 mg Intravenous Hold 07/27/24 2149)  diltiazem (CARDIZEM) 125 mg in dextrose  5% 125 mL (1 mg/mL) infusion (has no administration in time range)  metoCLOPramide  (REGLAN ) injection 10 mg (has no administration in time range)  diphenhydrAMINE  (BENADRYL ) injection 25 mg (has no administration in time range)  diltiazem (CARDIZEM) injection 10 mg (10 mg Intravenous Given 07/27/24 2027)  cefTRIAXone (ROCEPHIN) 1 g in sodium chloride  0.9 % 100 mL IVPB (1 g Intravenous New Bag/Given 07/27/24 2149)  iohexol (OMNIPAQUE) 300 MG/ML solution 100 mL (100 mLs Intravenous Contrast Given 07/27/24 2218)                                    Medical Decision Making Kathryn Ayala is a 89 y.o. female here presenting with shortness of breath and palpitations as well as abdominal pain.  Differential includes rapid A-fib versus heart failure versus UTI versus diverticulitis.  Plan to get CBC and CMP and BNP and troponin and CT abdomen pelvis.  11:36 PM I reviewed patient's labs and white blood cell count is 11.9.  Of note initial potassium was 7.1 but  it was hemolyzed and repeat potassium was 2.9.  UA showed moderate leuks and 11-20 white blood cell count.  Patient does have acute diverticulitis with no perforation or abscess.  Patient was given Rocephin and Flagyl.  Patient also started on Cardizem drip for rapid A-fib.  I discussed with cardiologist on-call, Dr. Orlando, who states that cardiology will consult on the patient in the morning.  Hospitalist to admit   Problems Addressed: Acute on chronic heart failure, unspecified heart failure type [facility]): acute illness or injury Atrial fibrillation with RVR (HCC): acute illness or injury Diverticulitis: acute illness or injury Hypokalemia: acute illness or injury  Amount and/or Complexity of Data Reviewed Labs: ordered. Decision-making details documented in ED Course. Radiology: ordered and independent interpretation performed. Decision-making details documented in ED Course.  Risk Prescription drug management.    Final diagnoses:  None    ED Discharge Orders     None          Patt Alm Macho, MD 07/27/24 2338

## 2024-07-27 NOTE — ED Notes (Signed)
 Pt to CT

## 2024-07-27 NOTE — H&P (Signed)
 History and Physical    Kathryn Ayala FMW:[id number] DOB: 1935-04-13 DOA: 07/27/2024  PCP: Shayne Anes, MD  Patient coming from: Home  I have personally briefly reviewed patient's old medical records in [facility]  Chief Complaint: Abdominal pain  HPI: Kathryn Ayala is a 89 y.o. female with medical history significant for permanent atrial fibrillation on Eliquis , history of CVA, HTN, HLD, depression/anxiety, lymphedema of lower extremities who presented to the ED for evaluation of left lower quadrant abdominal pain.  Patient reports persistent left lower quadrant abdominal pain which began about 2 weeks ago.  She reports 2 episodes of loose stools otherwise stools have been formed.  She has had nausea but no significant emesis.  She has felt like her heart has been racing and has felt intermittently short of breath during these episodes.  She reports subjective fevers.  She reports chronic lymphedema of her lower extremities and feels that her legs are slightly more swollen compared to baseline.  ED Course  Labs/Imaging on admission: I have personally reviewed following labs and imaging studies.  Initial vitals showed BP 206/120, pulse 122, RR 18, temp 99.0 F, SpO2 97% on room air.  Labs showed sodium 144, potassium 2.9, bicarb 20, BUN 10, creatinine 0.52, serum glucose 110, LFTs within normal limits, proBNP 1330, troponin T 15, WBC 11.9, hemoglobin 14.8, platelets 216.  Urinalysis showed negative nitrites, moderate leukocytes, 0-5 RBCs, 11-20 WBCs, no bacteria.  Urine culture in process.  CT head without contrast showed mild atrophic changes without acute abnormality.  2 view chest x-ray showed cardiac enlargement with evidence of mild perihilar edema.  CT abdomen/pelvis with contrast showed inflammatory changes in the distal descending and proximal sigmoid colon's consistent with focal diverticulitis.  No abscess or perforation.  Patient was given IV ceftriaxone and  Flagyl, IV K 10 mEq x 4, IV diltiazem 10 mg followed by continuous drip.  The hospitalist service was consulted for admission.  Review of Systems: All systems reviewed and are negative except as documented in history of present illness above.   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   Tachycardia    Urinary frequency     Past Surgical History:  Procedure Laterality Date   ABDOMINAL HYSTERECTOMY  1996   APPENDECTOMY  1956   BREAST BIOPSY Left 05/07/2016   BREAST BIOPSY Bilateral 1956, 1980   BREAST LUMPECTOMY WITH RADIOACTIVE SEED LOCALIZATION Left 06/16/2016   Procedure: LEFT BREAST LUMPECTOMY WITH RADIOACTIVE SEED LOCALIZATION;  Surgeon: Deward Null III, MD;  Location: [facility] OR;  Service: General;  Laterality: Left;   COLONOSCOPY     KIDNEY STONE SURGERY     KNEE SURGERY  11/20/2004   REVERSE SHOULDER ARTHROPLASTY Right 10/23/2023   Procedure: REVERSE SHOULDER ARTHROPLASTY;  Surgeon: Kay Kemps, MD;  Location: [facility] ORS;  Service: Orthopedics;  Laterality: Right;  interscalene block  110   TOTAL HIP ARTHROPLASTY Left 11/09/2022   Procedure: TOTAL HIP ARTHROPLASTY ANTERIOR APPROACH;  Surgeon: Ernie Cough, MD;  Location: [facility] ORS;  Service: Orthopedics;  Laterality: Left;   TOTAL KNEE ARTHROPLASTY Right 04/07/2022   Procedure: TOTAL KNEE ARTHROPLASTY;  Surgeon: Melodi Lerner, MD;  Location: [facility] ORS;  Service: Orthopedics;  Laterality: Right;   TOTAL KNEE ARTHROPLASTY Left 09/22/2022   Procedure: TOTAL KNEE ARTHROPLASTY;  Surgeon: Melodi Lerner, MD;  Location: [facility] ORS;  Service: Orthopedics;  Laterality: Left;     Social History: Social History   Tobacco Use   Smoking status: Never    Passive exposure: Never   Smokeless tobacco: Never  Vaping Use   Vaping status: Never Used  Substance Use Topics   Alcohol use: No   Drug use: No   Allergies  Allergen Reactions   Hydrocodone  Other (See Comments)    Makes very confused  Other Reaction(s): Not available  hydrocodone    Penicillins Anaphylaxis, Swelling and Rash    Has patient had a PCN reaction causing immediate rash, facial/tongue/throat swelling, SOB or lightheadedness with hypotension: Yes Has patient had a PCN reaction causing severe rash involving mucus membranes or skin necrosis: Yes Has patient had a PCN reaction that required hospitalization No Has patient had a PCN reaction occurring within the last 10 years: No If all of the above answers are NO, then may proceed with Cephalosporin use.    Azithromycin Other (See Comments)    PT is unsure of reaction   Codeine Other (See Comments)    Patient was told to not take this   Levofloxacin Other (See Comments)    UNSPECIFIED REACTION  Patient was told not to take it.  Other Reaction(s): Not available  levofloxacin   Sulfa Antibiotics Itching, Rash and Dermatitis    Family History  Problem Relation Age of Onset   Lung cancer Brother    Stroke Mother    Pneumonia Father    Cancer Brother      Prior to Admission medications   Medication Sig Start Date End Date Taking? Authorizing Provider  acetaminophen  (TYLENOL ) 500 MG tablet Take 1,000 mg by mouth every 6 (six) hours as needed for moderate pain.    [provider]  allopurinol  (ZYLOPRIM ) 100 MG tablet Take 100 mg by mouth 2 (two) times daily.  03/04/16   [provider]  apixaban  (ELIQUIS ) 2.5 MG TABS tablet Take 1 tablet (2.5 mg total) by mouth 2 (two) times daily. 09/23/23   Ladona Heinz, MD  atenolol  (TENORMIN ) 50 MG tablet Take 50 mg by mouth 2 (two) times daily. 05/05/16   [provider]   B Complex-C (B-COMPLEX WITH VITAMIN C) tablet Take 1 tablet by mouth daily. 11/12/22   Alexius Hangartner, Pranav M, MD  Biotin (BIOTIN 5000) 5 MG CAPS Take 5 mg by mouth in the morning.    [provider]  Cholecalciferol (VITAMIN D3) 50 MCG (2000 UT) CAPS Take 2,000 Units by mouth in the morning.    [provider]  ferrous sulfate  325 (65 FE) MG tablet Take 1 tablet (325 mg total) by mouth daily with breakfast. 11/11/22 10/06/25  Tobie Yetta HERO, MD  loratadine  (CLARITIN ) 10 MG tablet Take 10 mg by mouth in the morning.    [provider]  Melatonin 10 MG TABS Take 10 mg by mouth at bedtime.    [provider]  rosuvastatin  (CRESTOR ) 5 MG tablet Take 5 mg by mouth in the morning.    [provider]  traMADol  (ULTRAM ) 50 MG tablet Take 1 tablet (50 mg total) by mouth  every 6 (six) hours as needed for moderate pain (pain score 4-6) or severe pain (pain score 7-10). 10/23/23 10/22/24  Kay Kemps, MD  venlafaxine  XR (EFFEXOR -XR) 37.5 MG 24 hr capsule Take 37.5 mg by mouth in the morning.    [provider]    Physical Exam: Vitals:   07/27/24 2001 07/27/24 2154 07/27/24 2322 07/28/24 0036  BP:  (!) 195/84 (!) 184/94 (!) 160/76  Pulse:  (!) 113 (!) 108 99  Resp:  (!) 24 19 (!) 23  Temp:  99 F (37.2 C) 98.3 F (36.8 C)   TempSrc:  Oral Oral   SpO2: 99% 97% 98% 97%   Constitutional: Resting in bed, NAD, calm, comfortable Eyes: EOMI, lids and conjunctivae normal ENMT: Mucous membranes are moist. Posterior pharynx clear of any exudate or lesions.Normal dentition.  Neck: normal, supple, no masses. Respiratory: clear to auscultation bilaterally, no wheezing, no crackles. Normal respiratory effort. No accessory muscle use.  Cardiovascular: Irregularly irregular, tachycardic, no murmurs / rubs / gallops.  Trace bilateral lower extremity edema. 2+ pedal pulses. Abdomen: Tender to light palpation LUQ and LLQ Musculoskeletal: no clubbing / cyanosis. No joint  deformity upper and lower extremities. Good ROM, no contractures. Normal muscle tone.  Skin: no rashes, lesions, ulcers. No induration Neurologic: Sensation intact. Strength 5/5 in all 4.  Psychiatric: Normal judgment and insight. Alert and oriented x 3. Normal mood.   EKG: Personally reviewed. Atrial fibrillation, 123, RAD.  Rate is faster when compared to previous.  Assessment/Plan Principal Problem:   Permanent atrial fibrillation with RVR (HCC) Active Problems:   Diverticulitis of intestine without perforation or abscess   Hypokalemia   Hyperlipidemia   Hypomagnesemia   Jaymee Tilson is a 89 y.o. female with medical history significant for permanent atrial fibrillation on Eliquis , history of CVA, HTN, HLD, depression/anxiety, lymphedema of lower extremities who is admitted with A-fib with RVR and diverticulitis.  Assessment and Plan: Permanent atrial fibrillation with RVR: Likely worsened in setting of diverticulitis and electrolyte abnormalities.  Started on diltiazem drip in the ED. - Continue IV diltiazem infusion - Resume home atenolol  50 mg twice daily in a.m. - Supplement potassium/magnesium  - Continue Eliquis   Diverticulitis: CT consistent with focal diverticulitis in the distal descending and proximal sigmoid colons.  No abscess or perforation identified. - Continue IV ceftriaxone and Flagyl - Allow full liquid diet tonight and advance as tolerated  Hypokalemia/hypomagnesemia: Supplementing.  Repeat labs in AM.  Hyperlipidemia: Continue rosuvastatin .  Depression/anxiety: Continue Effexor  XR.   DVT prophylaxis: apixaban  (ELIQUIS ) tablet 2.5 mg Start: 07/28/24 0100 apixaban  (ELIQUIS ) tablet 2.5 mg   Code Status: Full code, discussed with patient on admission Family Communication: Discussed with patient, she has discussed with family Disposition Plan: From home, dispo pending clinical progress Consults called: None Severity of Illness: The appropriate  patient status for this patient is INPATIENT. Inpatient status is judged to be reasonable and necessary in order to provide the required intensity of service to ensure the patient's safety. The patient's presenting symptoms, physical exam findings, and initial radiographic and laboratory data in the context of their chronic comorbidities is felt to place them at high risk for further clinical deterioration. Furthermore, it is not anticipated that the patient will be medically stable for discharge from the hospital within 2 midnights of admission.   * I certify that at the point of admission it is my clinical judgment that the patient will require inpatient hospital care spanning beyond 2 midnights from the point of  admission due to high intensity of service, high risk for further deterioration and high frequency of surveillance required.DEWAINE Jorie Blanch MD [facility]  If 7PM-7AM, please contact night-coverage [email or web address]  07/28/2024, 1:03 AM

## 2024-07-28 ENCOUNTER — Encounter (HOSPITAL_COMMUNITY): Payer: Self-pay | Admitting: Internal Medicine

## 2024-07-28 DIAGNOSIS — K5792 Diverticulitis of intestine, part unspecified, without perforation or abscess without bleeding: Secondary | ICD-10-CM

## 2024-07-28 DIAGNOSIS — I4821 Permanent atrial fibrillation: Secondary | ICD-10-CM | POA: Diagnosis not present

## 2024-07-28 LAB — CBC
HCT: 39 % (ref 36.0–46.0)
Hemoglobin: 12.2 g/dL (ref 12.0–15.0)
MCH: 30.3 pg (ref 26.0–34.0)
MCHC: 31.3 g/dL (ref 30.0–36.0)
MCV: 97 fL (ref 80.0–100.0)
Platelets: 190 K/uL (ref 150–400)
RBC: 4.02 MIL/uL (ref 3.87–5.11)
RDW: 14.2 % (ref 11.5–15.5)
WBC: 11.9 K/uL — ABNORMAL HIGH (ref 4.0–10.5)
nRBC: 0 % (ref 0.0–0.2)

## 2024-07-28 LAB — BASIC METABOLIC PANEL WITH GFR
Anion gap: 11 (ref 5–15)
BUN: 9 mg/dL (ref 8–23)
CO2: 21 mmol/L — ABNORMAL LOW (ref 22–32)
Calcium: 9.1 mg/dL (ref 8.9–10.3)
Chloride: 104 mmol/L (ref 98–111)
Creatinine, Ser: 0.54 mg/dL (ref 0.44–1.00)
GFR, Estimated: >60 mL/min (ref >60)
Glucose, Bld: 117 mg/dL — ABNORMAL HIGH (ref 70–99)
Potassium: 3.8 mmol/L (ref 3.5–5.1)
Sodium: 136 mmol/L (ref 135–145)

## 2024-07-28 LAB — MAGNESIUM
Magnesium: 1.4 mg/dL — ABNORMAL LOW (ref 1.7–2.4)
Magnesium: 2.2 mg/dL (ref 1.7–2.4)

## 2024-07-28 LAB — PROTIME-INR
INR: 1.2 (ref 0.8–1.2)
Prothrombin Time: 16.1 s — ABNORMAL HIGH (ref 11.4–15.2)

## 2024-07-28 MED ORDER — ALLOPURINOL 100 MG PO TABS
100.0000 mg | ORAL_TABLET | Freq: Two times a day (BID) | ORAL | Status: DC
  Administered 2024-07-28 – 2024-07-30 (×5): 100 mg via ORAL
  Filled 2024-07-28 (×5): qty 1

## 2024-07-28 MED ORDER — MORPHINE SULFATE (PF) 2 MG/ML IV SOLN: 1.0000 mg | INTRAVENOUS | Status: DC | PRN

## 2024-07-28 MED ORDER — ORAL CARE MOUTH RINSE: 15.0000 mL | OROMUCOSAL | Status: DC | PRN

## 2024-07-28 MED ORDER — METRONIDAZOLE 500 MG/100ML IV SOLN
500.0000 mg | Freq: Two times a day (BID) | INTRAVENOUS | Status: AC
Start: 2024-07-28 — End: 2024-07-29
  Administered 2024-07-28 – 2024-07-29 (×3): 500 mg via INTRAVENOUS
  Filled 2024-07-28 (×3): qty 100

## 2024-07-28 MED ORDER — APIXABAN 2.5 MG PO TABS
2.5000 mg | ORAL_TABLET | Freq: Two times a day (BID) | ORAL | Status: DC
  Administered 2024-07-28: 2.5 mg via ORAL
  Filled 2024-07-28: qty 1

## 2024-07-28 MED ORDER — ENSURE PLUS HIGH PROTEIN PO LIQD
237.0000 mL | Freq: Two times a day (BID) | ORAL | Status: DC
  Administered 2024-07-28 – 2024-07-30 (×5): 237 mL via ORAL
  Filled 2024-07-28 (×2): qty 237

## 2024-07-28 MED ORDER — MELATONIN 5 MG PO TABS
10.0000 mg | ORAL_TABLET | Freq: Every day | ORAL | Status: DC
  Administered 2024-07-28 – 2024-07-29 (×2): 10 mg via ORAL
  Filled 2024-07-28 (×3): qty 2

## 2024-07-28 MED ORDER — MAGNESIUM SULFATE 2 GM/50ML IV SOLN
2.0000 g | Freq: Once | INTRAVENOUS | Status: AC
  Administered 2024-07-28: 2 g via INTRAVENOUS
  Filled 2024-07-28: qty 50

## 2024-07-28 MED ORDER — ONDANSETRON HCL 4 MG PO TABS: 4.0000 mg | ORAL_TABLET | Freq: Four times a day (QID) | ORAL | Status: DC | PRN

## 2024-07-28 MED ORDER — ACETAMINOPHEN 325 MG PO TABS
650.0000 mg | ORAL_TABLET | Freq: Four times a day (QID) | ORAL | Status: DC | PRN
  Administered 2024-07-28 – 2024-07-30 (×6): 650 mg via ORAL
  Filled 2024-07-28 (×6): qty 2

## 2024-07-28 MED ORDER — APIXABAN 5 MG PO TABS
5.0000 mg | ORAL_TABLET | Freq: Two times a day (BID) | ORAL | Status: DC
  Administered 2024-07-28 – 2024-07-30 (×4): 5 mg via ORAL
  Filled 2024-07-28 (×4): qty 1

## 2024-07-28 MED ORDER — SODIUM CHLORIDE 0.9% FLUSH
3.0000 mL | Freq: Two times a day (BID) | INTRAVENOUS | Status: DC
  Administered 2024-07-28 – 2024-07-30 (×6): 3 mL via INTRAVENOUS

## 2024-07-28 MED ORDER — ACETAMINOPHEN 650 MG RE SUPP: 650.0000 mg | Freq: Four times a day (QID) | RECTAL | Status: DC | PRN

## 2024-07-28 MED ORDER — ONDANSETRON HCL 4 MG/2ML IJ SOLN: 4.0000 mg | Freq: Four times a day (QID) | INTRAMUSCULAR | Status: DC | PRN

## 2024-07-28 MED ORDER — SODIUM CHLORIDE 0.9 % IV SOLN
2.0000 g | INTRAVENOUS | Status: AC
  Administered 2024-07-28 – 2024-07-29 (×2): 2 g via INTRAVENOUS
  Filled 2024-07-28 (×2): qty 20

## 2024-07-28 MED ORDER — SENNOSIDES-DOCUSATE SODIUM 8.6-50 MG PO TABS: 1.0000 | ORAL_TABLET | Freq: Every evening | ORAL | Status: DC | PRN

## 2024-07-28 MED ORDER — SODIUM CHLORIDE 0.9 % IV SOLN
INTRAVENOUS | Status: AC | PRN
  Administered 2024-07-28: 500 mL via INTRAVENOUS

## 2024-07-28 MED ORDER — VENLAFAXINE HCL ER 37.5 MG PO CP24
37.5000 mg | ORAL_CAPSULE | Freq: Every morning | ORAL | Status: DC
  Administered 2024-07-28 – 2024-07-30 (×3): 37.5 mg via ORAL
  Filled 2024-07-28 (×3): qty 1

## 2024-07-28 MED ORDER — ROSUVASTATIN CALCIUM 5 MG PO TABS
5.0000 mg | ORAL_TABLET | Freq: Every morning | ORAL | Status: DC
  Administered 2024-07-28 – 2024-07-30 (×3): 5 mg via ORAL
  Filled 2024-07-28 (×3): qty 1

## 2024-07-28 MED ORDER — ATENOLOL 50 MG PO TABS
50.0000 mg | ORAL_TABLET | Freq: Two times a day (BID) | ORAL | Status: DC
  Administered 2024-07-28 – 2024-07-30 (×5): 50 mg via ORAL
  Filled 2024-07-28 (×5): qty 1

## 2024-07-28 NOTE — Progress Notes (Signed)
  Progress Note   Patient: Kathryn Ayala FMW:[id number] DOB: 07-May-1935 DOA: 07/27/2024     1 DOS: the patient was seen and examined on 07/28/2024   Brief hospital course: Kathryn Ayala is a 89 y.o. female with medical history significant for permanent atrial fibrillation on Eliquis , history of CVA, HTN, HLD, depression/anxiety, lymphedema of lower extremities who is admitted with A-fib with RVR and diverticulitis.  Started on IV Diltiazem and Cipro/Flagyl.  Assessment and Plan:  Permanent atrial fibrillation with RVR Likely worsened in setting of diverticulitis and electrolyte abnormalities Started on diltiazem drip in the ED She has now converted to NSR and is off dilt at this time Resume home atenolol  50 mg BID Supplement potassium/magnesium  Continue Eliquis    Diverticulitis CT consistent with focal diverticulitis in the distal descending and proximal sigmoid colons No abscess or perforation identified Continue IV ceftriaxone and Flagyl Allow full liquid diet for now She is having some diarrhea   Hypokalemia/hypomagnesemia Supplementing as needed   Hyperlipidemia Continue rosuvastatin    Depression/anxiety Continue Effexor  XR    Consultants: None  Procedures: None  Antibiotics: Ceftriaxone 10/8- Metronidazole 10/8-    Physical Exam: Vitals:   07/28/24 0500 07/28/24 0700 07/28/24 0730 07/28/24 0830  BP: (!) 161/70 127/64 127/64 120/62  Pulse: (!) 109 87 86   Resp: (!) 22 (!) 24 17 (!) 25  Temp:   98.3 F (36.8 C)   TempSrc:   Oral   SpO2: 97% (!) 14% 95%      Subjective: Still having abdominal pain.  Some diarrhea.  No n/v.   Intake/Output Summary (Last 24 hours) at 07/28/2024 0902 Last data filed at 07/28/2024 0707 Gross per 24 hour  Intake 494.63 ml  Output --  Net 494.63 ml   There were no vitals filed for this visit.  Exam:  General:  Appears calm and comfortable and is in NAD Eyes:  normal lids, iris ENT:  grossly normal hearing,  lips & tongue, mmm Cardiovascular:  Irregularly irregular but rate controlled this AM. Chronic lymphedema.  Respiratory:   CTA bilaterally with no wheezes/rales/rhonchi.  Normal respiratory effort. Abdomen:  soft, NT, ND Skin:  no rash or induration seen on limited exam Musculoskeletal:  grossly normal tone BUE/BLE, good ROM, no bony abnormality Psychiatric:  grossly normal mood and affect, speech fluent and appropriate, AOx3 Neurologic:  CN 2-12 grossly intact, moves all extremities in coordinated fashion  Data Reviewed: I have reviewed the patient's lab results since admission.  Pertinent labs for today include:   Unremarkable BMP WBC 11.9    Family Communication: None present; she declined to have me contact family today  Disposition: Status is: Inpatient Remains inpatient appropriate because: ongoing management  Planned Discharge Destination: Home    Time spent: 50 minutes  Author: Delon Herald, MD 07/28/2024 9:00 AM  For on call review [email or web address].

## 2024-07-28 NOTE — Hospital Course (Signed)
 Kathryn Ayala is a 89 y.o. female with medical history significant for permanent atrial fibrillation on Eliquis , history of CVA, HTN, HLD, depression/anxiety, lymphedema of lower extremities who is admitted with A-fib with RVR and diverticulitis.

## 2024-07-29 ENCOUNTER — Encounter (HOSPITAL_COMMUNITY): Payer: Self-pay | Admitting: Internal Medicine

## 2024-07-29 DIAGNOSIS — I4821 Permanent atrial fibrillation: Secondary | ICD-10-CM | POA: Diagnosis not present

## 2024-07-29 LAB — URINE CULTURE: Culture: <10000 — AB

## 2024-07-29 LAB — GLUCOSE, CAPILLARY: Glucose-Capillary: 105 mg/dL — ABNORMAL HIGH (ref 70–99)

## 2024-07-29 MED ORDER — CIPROFLOXACIN HCL 500 MG PO TABS
500.0000 mg | ORAL_TABLET | Freq: Two times a day (BID) | ORAL | Status: DC
  Administered 2024-07-30: 500 mg via ORAL
  Filled 2024-07-29: qty 1

## 2024-07-29 MED ORDER — METRONIDAZOLE 500 MG PO TABS
500.0000 mg | ORAL_TABLET | Freq: Two times a day (BID) | ORAL | Status: DC
  Administered 2024-07-30: 500 mg via ORAL
  Filled 2024-07-29: qty 1

## 2024-07-29 NOTE — TOC Initial Note (Signed)
 Transition of Care [facility]) - Initial/Assessment Note   Patient Details  Name: Kathryn Ayala MRN: [id number] Date of Birth: [birth year]  Transition of Care [facility]) CM/SW Contact:    Duwaine GORMAN Aran, LCSW Phone Number: 07/29/2024, 9:32 AM  Clinical Narrative: Patient is from home with family. Patient is currently on IV antibiotics. Care management following for possible discharge needs.  Expected Discharge Plan: Home/Self Care Barriers to Discharge: Continued Medical Work up  Expected Discharge Plan and Services In-house Referral: Clinical Social Work Living arrangements for the past 2 months: Single Family Home            DME Arranged: N/A DME Agency: NA  Prior Living Arrangements/Services Living arrangements for the past 2 months: Single Family Home Lives with:: Adult Children Patient language and need for interpreter reviewed:: Yes Do you feel safe going back to the place where you live?: Yes      Need for Family Participation in Patient Care: No (Comment) Care giver support system in place?: Yes (comment) Criminal Activity/Legal Involvement Pertinent to Current Situation/Hospitalization: No - Comment as needed  Activities of Daily Living ADL Screening (condition at time of admission) Independently performs ADLs?: Yes (appropriate for developmental age) Is the patient deaf or have difficulty hearing?: No Does the patient have difficulty seeing, even when wearing glasses/contacts?: No Does the patient have difficulty concentrating, remembering, or making decisions?: No  Emotional Assessment Alcohol / Substance Use: Not Applicable Psych Involvement: No (comment)  Admission diagnosis:  Hypokalemia [E87.6] Diverticulitis [K57.92] Atrial fibrillation with RVR (HCC) [I48.91] Acute on chronic heart failure, unspecified heart failure type (HCC) [I50.9] Permanent atrial fibrillation with RVR (HCC) [I48.21] Patient Active Problem List   Diagnosis Date Noted   Diverticulitis of  intestine without perforation or abscess 07/28/2024   Hypomagnesemia 07/28/2024   Permanent atrial fibrillation with RVR (HCC) 07/27/2024   S/P total left hip arthroplasty 11/09/2022   Closed left hip fracture (HCC) 11/07/2022   Osteoarthritis of left knee 09/22/2022   Osteoarthritis of right knee 04/07/2022   Cellulitis of right lower limb 02/06/2022   Chronic kidney disease, stage 3a (HCC) 02/06/2022   Dizzy 02/06/2022   Peripheral venous insufficiency 02/06/2022   Varicose veins of bilateral lower extremities with other complications 02/06/2022   Peripheral vascular disease 01/30/2022   Thrombophilia 12/15/2019   Vitamin D  deficiency 08/25/2019   Primary osteoarthritis of both knees 08/16/2019   Acute otitis externa of left ear 03/08/2019   Sensorineural hearing loss (SNHL) of right ear with unrestricted hearing of left ear 03/08/2019   Current chronic use of systemic steroids 02/11/2019   Muscle pain 12/24/2018   Unilateral primary osteoarthritis, right knee 04/06/2018   Dermatitis 12/29/2017   Impacted cerumen 09/24/2017   Asymptomatic microscopic hematuria 06/24/2017   Trigger finger, right middle finger 02/16/2017   Acute pain of right knee 02/16/2017   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) 05/26/2016   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/12/2016   Chronic kidney disease due to hypertension 02/22/2015   Disorder of bone 06/21/2013   Gout 04/26/2012   Hyperlipidemia 04/26/2012   Proteinuria 04/26/2012   Paroxysmal atrial fibrillation (HCC) 12/08/2011   Underimmunization status 08/01/2010   Hypokalemia 01/07/2010   Impaired fasting glucose 01/07/2010   Allergic rhinitis 12/11/2009   Anemia 12/11/2009   Anxiety disorder 12/11/2009   Diaphragmatic hernia 12/11/2009   Primary hypertension 12/11/2009   Fibromyalgia 12/11/2009   PCP:  Shayne Anes, MD Pharmacy:   [facility] [phone] -  [place], [place] - [address] [place] [address] Phone: [phone] Fax: [phone]  Social Drivers of Health (SDOH) Social History: SDOH Screenings   Food Insecurity: No Food Insecurity (07/28/2024)  Housing: Low Risk  (07/28/2024)  Transportation Needs: No Transportation Needs (07/28/2024)  Utilities: Not At Risk (07/28/2024)  Social Connections: Moderately Integrated (07/28/2024)  Tobacco Use: Low Risk  (10/23/2023)   SDOH Interventions:    Readmission Risk Interventions     No data to display

## 2024-07-29 NOTE — Progress Notes (Signed)
 Progress Note   Patient: Kathryn Ayala FMW:[id number] DOB: 1934-10-21 DOA: 07/27/2024     2 DOS: the patient was seen and examined on 07/29/2024   Brief hospital course: Kathryn Ayala is a 89 y.o. female with medical history significant for permanent atrial fibrillation on Eliquis , history of CVA, HTN, HLD, depression/anxiety, lymphedema of lower extremities who is admitted with A-fib with RVR and diverticulitis.  Assessment and Plan:  Permanent atrial fibrillation with RVR Likely worsened in setting of diverticulitis and electrolyte abnormalities Started on diltiazem drip in the ED She is now rate controlled and is off dilt at this time Resumed home atenolol  50 mg BID Supplement potassium/magnesium  Continue Eliquis , although dose based on age/weight/renal function should be 5 mg BID (confirmed with pharmacy)   Diverticulitis CT consistent with focal diverticulitis in the distal descending and proximal sigmoid colon No abscess or perforation identified Continue antibiotics but will transition ceftriaxone -> Cipro and Flagyl to PO starting 10/11 Advance diet to soft Anticipate dc to home tomorrow   Hypokalemia/hypomagnesemia Supplementing as needed   Hyperlipidemia Continue rosuvastatin    Depression/anxiety Continue Effexor  XR       Consultants: None   Procedures: None   Antibiotics: Ceftriaxone 10/8- Metronidazole 10/8-    30 Day Unplanned Readmission Risk Score    Flowsheet Row ED to Hosp-Admission (Current) from 07/27/2024 in [place] 4TH FLOOR PROGRESSIVE CARE AND UROLOGY  30 Day Unplanned Readmission Risk Score (%) 15.78 Filed at 07/29/2024 1600    This score is the patient's risk of an unplanned readmission within 30 days of being discharged (0 -100%). The score is based on dignosis, age, lab data, medications, orders, and past utilization.   Low:  0-14.9   Medium: 15-21.9   High: 22-29.9   Extreme: 30 and above           Subjective:  Feeling better. One soft BM this AM and a small BM overnight.  Still having some abdominal pain.  Ambulated well with therapy.   Objective: Vitals:   07/29/24 0532 07/29/24 1317  BP: (!) 155/76 (!) 160/76  Pulse: 64 64  Resp: 18 20  Temp: 97.7 F (36.5 C) 98.3 F (36.8 C)  SpO2: 94% 99%    Intake/Output Summary (Last 24 hours) at 07/29/2024 1637 Last data filed at 07/29/2024 1300 Gross per 24 hour  Intake 1134.07 ml  Output --  Net 1134.07 ml   Filed Weights   07/28/24 1628  Weight: 97.6 kg    Exam:  General:  Appears calm and comfortable and is in NAD Eyes:  normal lids, iris ENT:  grossly normal hearing, lips & tongue, mmm Cardiovascular:  Irregularly irregular but rate controlled this AM. Chronic lymphedema, mild.  Respiratory:   CTA bilaterally with no wheezes/rales/rhonchi.  Normal respiratory effort. Abdomen:  soft, mildly diffusely TTP, ND Skin:  no rash or induration seen on limited exam Musculoskeletal:  grossly normal tone BUE/BLE, good ROM, no bony abnormality Psychiatric:  grossly normal mood and affect, speech fluent and appropriate, AOx3 Neurologic:  CN 2-12 grossly intact, moves all extremities in coordinated fashion  Data Reviewed: I have reviewed the patient's lab results since admission.  Pertinent labs for today include:   Stable BMP WBC 11.9     Family Communication: Daughter was present throughout  Mobility: PT/OT Consulted      Code Status: Full Code    Disposition: Status is: Inpatient Remains inpatient appropriate because: ongoing monitoring     Time spent: 50 minutes  Unresulted  Labs (From admission, onward)    None        Author: Delon Herald, MD 07/29/2024 4:37 PM  For on call review [email or web address].

## 2024-07-30 DIAGNOSIS — I4821 Permanent atrial fibrillation: Secondary | ICD-10-CM | POA: Diagnosis not present

## 2024-07-30 MED ORDER — METRONIDAZOLE 500 MG PO TABS: 500.0000 mg | ORAL_TABLET | Freq: Two times a day (BID) | ORAL | 0 refills | Status: AC

## 2024-07-30 MED ORDER — HYDRALAZINE HCL 20 MG/ML IJ SOLN
5.0000 mg | Freq: Once | INTRAMUSCULAR | Status: AC
  Administered 2024-07-30: 5 mg via INTRAVENOUS
  Filled 2024-07-30: qty 1

## 2024-07-30 MED ORDER — HYDRALAZINE HCL 20 MG/ML IJ SOLN: 10.0000 mg | Freq: Three times a day (TID) | INTRAMUSCULAR | Status: DC | PRN

## 2024-07-30 MED ORDER — CIPROFLOXACIN HCL 500 MG PO TABS: 500.0000 mg | ORAL_TABLET | Freq: Two times a day (BID) | ORAL | 0 refills | Status: AC

## 2024-07-30 MED ORDER — ENSURE PLUS HIGH PROTEIN PO LIQD: 237.0000 mL | Freq: Two times a day (BID) | ORAL | 0 refills | Status: AC

## 2024-07-30 MED ORDER — APIXABAN 5 MG PO TABS: 5.0000 mg | ORAL_TABLET | Freq: Two times a day (BID) | ORAL | 0 refills | Status: AC

## 2024-07-30 NOTE — Progress Notes (Signed)
Patient discharged: Home with family  Via: Wheelchair   Discharge paperwork given: to patient and family  Reviewed with teach back  IV and telemetry disconnected  Belongings given to patient    

## 2024-07-30 NOTE — Progress Notes (Signed)
 HH setup w/ [facility].

## 2024-07-30 NOTE — Evaluation (Addendum)
 Physical Therapy Evaluation Patient Details Name: Kathryn Ayala MRN: [id number] DOB: [birth year] Today's Date: 07/30/2024  History of Present Illness  Pt is an 89yo female presenting with afib with RVR and diverticulitis. PMH: anemia, anxiety, afib on eliquis , BLE lymphedema, hx of breast cancer, fibromyalgia, Hld, HTN, stroke, R-RSA 10/2023, L-THA 20224, bilateral TKA 2023  Clinical Impression  Pt admitted with above diagnosis.  Pt currently with functional limitations due to the deficits listed below (see PT Problem List). Pt guided through bed mobility with HOB elevated and use of bed rails with supervision and increased time able to scoot hips EOB with only Vcs. Pt guided through STS to RW with SUP and multiple rocking attempts. Pt instructed in gait training 45ft with RW and CGA, no physical assist nor overt LOB noted. Provided education on hospital-associated weakness and encouraged pt to walk to bathroom with RW and to participate with mobility specialist. Pt's HR monitored throughout and ranged 69-79 even post-ambulation. BP start of session 115/66 (81) with Spo2 99% on RA, semireclined in bed. Pt will benefit from acute skilled PT to increase their independence and safety with mobility to allow discharge. Recommended HHPT on discharge, pt has all necessary DME.           If plan is discharge home, recommend the following: A little help with walking and/or transfers;A little help with bathing/dressing/bathroom;Assistance with cooking/housework   Can travel by private vehicle        Equipment Recommendations None recommended by PT (pt has recommended DME)  Recommendations for Other Services       Functional Status Assessment Patient has had a recent decline in their functional status and demonstrates the ability to make significant improvements in function in a reasonable and predictable amount of time.     Precautions / Restrictions Precautions Precautions:  Fall Restrictions Weight Bearing Restrictions Per Provider Order: No      Mobility  Bed Mobility Overal bed mobility: Needs Assistance Bed Mobility: Supine to Sit     Supine to sit: Supervision, HOB elevated, Used rails     General bed mobility comments: For safety, use of bed rails with HOB elevated    Transfers Overall transfer level: Needs assistance Equipment used: Rolling walker (2 wheels) Transfers: Sit to/from Stand Sit to Stand: Supervision           General transfer comment: SUP for safety, increased time, requiring multiple attempts but able to perform without physical assist nor overt LOB noted.    Ambulation/Gait Ambulation/Gait assistance: Contact guard assist Gait Distance (Feet): 80 Feet Assistive device: Rolling walker (2 wheels) Gait Pattern/deviations: WFL(Within Functional Limits) Gait velocity: decreased     General Gait Details: Pt ambulated with RW and CGA for 37ft with 2-90deg turns and 1-180deg turn without overt LOB noted, no major gait dysfunctions noted other than VERY slow step-through cadence.  Stairs            Wheelchair Mobility     Tilt Bed    Modified Rankin (Stroke Patients Only)       Balance Overall balance assessment: Needs assistance Sitting-balance support: Feet supported, No upper extremity supported Sitting balance-Leahy Scale: Good     Standing balance support: Reliant on assistive device for balance, During functional activity, Bilateral upper extremity supported Standing balance-Leahy Scale: Poor                               Pertinent Vitals/Pain Pain  Assessment Pain Assessment: No/denies pain    Home Living Family/patient expects to be discharged to:: Private residence Living Arrangements: Alone;Other relatives (Granddaughter has Part-time job) Available Help at Discharge: Family;Available PRN/intermittently Type of Home: House Home Access: Stairs to enter Entrance Stairs-Rails:  Left Entrance Stairs-Number of Steps: 2   Home Layout: One level Home Equipment: Grab bars - tub/shower;Grab bars - toilet;Rolling Walker (2 wheels);Shower seat;Hand held shower head;BSC/3in1;Cane - single point Additional Comments: Daughter is an Charity fundraiser in [place]    Prior Function Prior Level of Function : Independent/Modified Independent;Driving             Mobility Comments: SPC for community distances. Reports several losses of balance without true fall, staggering into things ADLs Comments: IND     Extremity/Trunk Assessment   Upper Extremity Assessment Upper Extremity Assessment: Defer to OT evaluation    Lower Extremity Assessment Lower Extremity Assessment: RLE deficits/detail;LLE deficits/detail RLE Deficits / Details: Gross MMT 5/5 at hip knee ankle, use of towel to pad leg to prevent pain at testing site. RLE Sensation: WNL LLE Deficits / Details: Gross MMT 5/5 at hip knee ankle, use of towel to pad leg to prevent pain at testing site. LLE Sensation: WNL    Cervical / Trunk Assessment Cervical / Trunk Assessment: Normal  Communication   Communication Communication: No apparent difficulties    Cognition Arousal: Alert Behavior During Therapy: WFL for tasks assessed/performed, Anxious   PT - Cognitive impairments: No apparent impairments                         Following commands: Intact       Cueing Cueing Techniques: Verbal cues     General Comments      Exercises     Assessment/Plan    PT Assessment Patient needs continued PT services  PT Problem List Decreased activity tolerance;Decreased balance;Decreased mobility       PT Treatment Interventions DME instruction;Gait training;Stair training;Functional mobility training;Therapeutic activities;Therapeutic exercise;Balance training    PT Goals (Current goals can be found in the Care Plan section)  Acute Rehab PT Goals Patient Stated Goal: To get stronger PT Goal  Formulation: With patient Time For Goal Achievement: 08/13/24 Potential to Achieve Goals: Good    Frequency Min 1X/week     Co-evaluation               AM-PAC PT 6 Clicks Mobility  Outcome Measure Help needed turning from your back to your side while in a flat bed without using bedrails?: A Little Help needed moving from lying on your back to sitting on the side of a flat bed without using bedrails?: A Little Help needed moving to and from a bed to a chair (including a wheelchair)?: A Little Help needed standing up from a chair using your arms (e.g., wheelchair or bedside chair)?: A Little Help needed to walk in hospital room?: A Little Help needed climbing 3-5 steps with a railing? : Total 6 Click Score: 16    End of Session Equipment Utilized During Treatment: Gait belt Activity Tolerance: Patient tolerated treatment well;No increased pain Patient left: in chair;with call bell/phone within reach;with chair alarm set Nurse Communication: Mobility status PT Visit Diagnosis: Other abnormalities of gait and mobility (R26.89)    Time: 8960-8886 PT Time Calculation (min) (ACUTE ONLY): 34 min   Charges:   PT Evaluation $PT Eval Low Complexity: 1 Low PT Treatments $Gait Training: 8-22 mins PT General Charges $$ ACUTE PT  VISIT: 1 Visit         Elsie Grieves, PT, DPT [facility] Rehabilitation Department Office: [phone]  Elsie Grieves 07/30/2024, 11:14 AM

## 2024-07-30 NOTE — Evaluation (Signed)
 Occupational Therapy Evaluation Patient Details Name: Kathryn Ayala MRN: [id number] DOB: 07/06/1935 Today's Date: 07/30/2024   History of Present Illness   Pt is an 89yo female presenting with afib with RVR and diverticulitis. PMH: anemia, anxiety, afib on eliquis , BLE lymphedema, hx of breast cancer, fibromyalgia, Hld, HTN, stroke, R-RSA 10/2023, L-THA 20224, bilateral TKA 2023     Clinical Impressions Pt c/o headache, constant for several weeks, 5/10. Pt lives alone, PLOF mod I with cane at times, has strong family support. Pt currently set up/supervision for safety with ADLs/mobilty with RW, has RW and rollator at home. Pt requires increased time due to generalized weakness and decreased activity tolerance, but overall doing well. VSS throughout session. Pt would benefit from [facility] follow up to maximize functional independence and return to PLOF, will continue to see acutely to progress as able.     If plan is discharge home, recommend the following:   A little help with walking and/or transfers;A little help with bathing/dressing/bathroom;Assistance with cooking/housework;Assist for transportation;Help with stairs or ramp for entrance     Functional Status Assessment   Patient has had a recent decline in their functional status and demonstrates the ability to make significant improvements in function in a reasonable and predictable amount of time.     Equipment Recommendations   None recommended by OT     Recommendations for Other Services         Precautions/Restrictions   Precautions Precautions: Fall Recall of Precautions/Restrictions: Intact Restrictions Weight Bearing Restrictions Per Provider Order: No     Mobility Bed Mobility Overal bed mobility: Needs Assistance             General bed mobility comments: in recliner    Transfers Overall transfer level: Needs assistance Equipment used: Rolling walker (2 wheels) Transfers: Sit to/from  Stand, Bed to chair/wheelchair/BSC Sit to Stand: Supervision           General transfer comment: supervision for safety, increased time      Balance Overall balance assessment: Needs assistance Sitting-balance support: Feet supported, No upper extremity supported Sitting balance-Leahy Scale: Good     Standing balance support: Reliant on assistive device for balance, During functional activity, Bilateral upper extremity supported Standing balance-Leahy Scale: Poor Standing balance comment: reliant on RW for support                           ADL either performed or assessed with clinical judgement   ADL Overall ADL's : Needs assistance/impaired Eating/Feeding: Set up   Grooming: Set up;Supervision/safety;Standing   Upper Body Bathing: Set up;Sitting   Lower Body Bathing: Set up;Sitting/lateral leans;Sit to/from stand   Upper Body Dressing : Set up;Sitting   Lower Body Dressing: Set up;Sitting/lateral leans   Toilet Transfer: Supervision/safety;Rolling walker (2 wheels)   Toileting- Clothing Manipulation and Hygiene: Supervision/safety       Functional mobility during ADLs: Supervision/safety;Rolling walker (2 wheels) General ADL Comments: Pt overall set up/supervision.     Vision Baseline Vision/History: 1 Wears glasses Ability to See in Adequate Light: 0 Adequate Patient Visual Report: No change from baseline       Perception         Praxis         Pertinent Vitals/Pain Pain Assessment Pain Assessment: 0-10 Pain Score: 5  Pain Location: headache Pain Descriptors / Indicators: Aching, Constant Pain Intervention(s): Monitored during session     Extremity/Trunk Assessment Upper Extremity Assessment Upper Extremity Assessment:  Overall WFL for tasks assessed;RUE deficits/detail;LUE deficits/detail RUE Deficits / Details: overall WFLs, significant arthritis in B hands limiting gross grip, fair FM skills, able to complete ADLs as needed. RUE  Sensation: WNL RUE Coordination: WNL LUE Deficits / Details: overall WFLs, significant arthritis in B hands limiting gross grip, fair FM skills, able to complete ADLs as needed. LUE Sensation: WNL LUE Coordination: WNL   Lower Extremity Assessment Lower Extremity Assessment: Overall WFL for tasks assessed RLE Deficits / Details: Gross MMT 5/5 at hip knee ankle, use of towel to pad leg to prevent pain at testing site. RLE Sensation: WNL LLE Deficits / Details: Gross MMT 5/5 at hip knee ankle, use of towel to pad leg to prevent pain at testing site. LLE Sensation: WNL   Cervical / Trunk Assessment Cervical / Trunk Assessment: Normal   Communication Communication Communication: No apparent difficulties   Cognition Arousal: Alert Behavior During Therapy: WFL for tasks assessed/performed, Anxious Cognition: No apparent impairments                               Following commands: Intact       Cueing  General Comments   Cueing Techniques: Verbal cues  VSS   Exercises     Shoulder Instructions      Home Living Family/patient expects to be discharged to:: Private residence Living Arrangements: Alone;Other relatives Available Help at Discharge: Family;Available PRN/intermittently Type of Home: House Home Access: Stairs to enter [employer] of Steps: 2 Entrance Stairs-Rails: Left Home Layout: One level     Bathroom Shower/Tub: Producer, television/film/video: Handicapped height Bathroom Accessibility: Yes How Accessible: Accessible via walker Home Equipment: Grab bars - tub/shower;Grab bars - toilet;Rolling Walker (2 wheels);Shower seat;Hand held shower head;BSC/3in1;Cane - single point   Additional Comments: Pt lives alone, granddaughter and grandson are close by and can assist at times, other friends/family can assist.      Prior Functioning/Environment Prior Level of Function : Independent/Modified Independent;Driving              Mobility Comments: SPC for community distances. Reports several losses of balance without true fall, staggering into things ADLs Comments: IND    OT Problem List: Decreased strength;Decreased activity tolerance;Impaired balance (sitting and/or standing);Pain   OT Treatment/Interventions: Self-care/ADL training;Therapeutic exercise;Energy conservation;DME and/or AE instruction;Therapeutic activities;Patient/family education;Balance training      OT Goals(Current goals can be found in the care plan section)   Acute Rehab OT Goals Patient Stated Goal: to return home OT Goal Formulation: With patient/family Time For Goal Achievement: 08/13/24 Potential to Achieve Goals: Good   OT Frequency:  Min 2X/week    Co-evaluation              AM-PAC OT 6 Clicks Daily Activity     Outcome Measure Help from another person eating meals?: A Little Help from another person taking care of personal grooming?: A Little Help from another person toileting, which includes using toliet, bedpan, or urinal?: A Little Help from another person bathing (including washing, rinsing, drying)?: A Little Help from another person to put on and taking off regular upper body clothing?: A Little Help from another person to put on and taking off regular lower body clothing?: A Little 6 Click Score: 18   End of Session Equipment Utilized During Treatment: Gait belt;Rolling walker (2 wheels) Nurse Communication: Mobility status  Activity Tolerance: Patient tolerated treatment well Patient left: in bed;with call  bell/phone within reach;with bed alarm set  OT Visit Diagnosis: Unsteadiness on feet (R26.81);Muscle weakness (generalized) (M62.81)                Time: 1235-1300 OT Time Calculation (min): 25 min Charges:  OT General Charges $OT Visit: 1 Visit OT Evaluation $OT Eval Low Complexity: 1 Low OT Treatments $Self Care/Home Management : 8-22 mins  [place], OTR/L   Elouise JONELLE Bott 07/30/2024, 1:12 PM

## 2024-07-30 NOTE — Discharge Summary (Signed)
 Physician Discharge Summary   Patient: Kathryn Ayala MRN: [id number] DOB: 07-02-1935  Admit date:     07/27/2024  Discharge date: 07/30/24  Discharge Physician: Delon Herald   PCP: Shayne Anes, MD   Recommendations at discharge:   You are being discharged with home physical and occupational therapy Complete antibiotics (Ciprofloxacin and metronidazole twice daily to complete 7 total days) Take Eliquis  at 5 mg twice daily dosing Follow up with Dr. Shayne in 1-2 weeks  Discharge Diagnoses: Principal Problem:   Permanent atrial fibrillation with RVR (HCC) Active Problems:   Diverticulitis of intestine without perforation or abscess   Hypokalemia   Hyperlipidemia   Hypomagnesemia    Hospital Course: Aricka Goldberger is a 89 y.o. female with medical history significant for permanent atrial fibrillation on Eliquis , history of CVA, HTN, HLD, depression/anxiety, lymphedema of lower extremities who is admitted with A-fib with RVR and diverticulitis.  Assessment and Plan:  Diverticulitis CT consistent with focal diverticulitis in the distal descending and proximal sigmoid colon No abscess or perforation identified Continue antibiotics but will transition ceftriaxone -> Cipro and Flagyl to PO starting 10/11 Advance diet to soft Stable for dc to home  Permanent atrial fibrillation with RVR Likely worsened in setting of diverticulitis and electrolyte abnormalities Started on diltiazem drip in the ED She is now rate controlled and is off dilt at this time Resumed home atenolol  50 mg BID Supplement potassium/magnesium  Continue Eliquis , although dose based on age/weight/renal function should be 5 mg BID (confirmed with pharmacy)   Hypokalemia/hypomagnesemia Supplementing as needed  HTN Continue atenolol    Hyperlipidemia Continue rosuvastatin    Depression/anxiety Continue Effexor  XR       Consultants: None   Procedures: None   Antibiotics: Ceftriaxone  10/8- Metronidazole 10/8-       Pain control - [place]  Controlled Substance Reporting System database was reviewed. and patient was instructed, not to drive, operate heavy machinery, perform activities at heights, swimming or participation in water  activities or provide baby-sitting services while on Pain, Sleep and Anxiety Medications; until their outpatient Physician has advised to do so again. Also recommended to not to take more than prescribed Pain, Sleep and Anxiety Medications.   Disposition: Home Diet recommendation:  Soft diet, advance as tolerated  DISCHARGE MEDICATION: Allergies as of 07/30/2024       Reactions   Hydrocodone  Other (See Comments)   Makes very confused Other Reaction(s): Not available hydrocodone    Penicillins Anaphylaxis, Swelling, Rash   Has patient had a PCN reaction causing immediate rash, facial/tongue/throat swelling, SOB or lightheadedness with hypotension: Yes Has patient had a PCN reaction causing severe rash involving mucus membranes or skin necrosis: Yes Has patient had a PCN reaction that required hospitalization No Has patient had a PCN reaction occurring within the last 10 years: No If all of the above answers are NO, then may proceed with Cephalosporin use.   Azithromycin Other (See Comments)   PT is unsure of reaction   Codeine Other (See Comments)   Patient was told to not take this   Levofloxacin Other (See Comments)   UNSPECIFIED REACTION Patient was told not to take it. Other Reaction(s): Not available levofloxacin   Sulfa Antibiotics Itching, Rash, Dermatitis        Medication List     TAKE these medications    acetaminophen  500 MG tablet Commonly known as: TYLENOL  Take 1,000 mg by mouth every 6 (six) hours as needed for moderate pain.   allopurinol  100 MG tablet Commonly  known as: ZYLOPRIM  Take 100 mg by mouth 2 (two) times daily.   apixaban  5 MG Tabs tablet Commonly known as: ELIQUIS  Take 1 tablet (5 mg  total) by mouth 2 (two) times daily. What changed:  medication strength how much to take Another medication with the same name was removed. Continue taking this medication, and follow the directions you see here.   atenolol  50 MG tablet Commonly known as: TENORMIN  Take 50 mg by mouth 2 (two) times daily.   B-complex with vitamin C tablet Take 1 tablet by mouth daily.   Biotin 5000 5 MG Caps Generic drug: Biotin Take 5 mg by mouth in the morning.   ciprofloxacin 500 MG tablet Commonly known as: CIPRO Take 1 tablet (500 mg total) by mouth 2 (two) times daily for 4 days.   feeding supplement Liqd Take 237 mLs by mouth 2 (two) times daily between meals.   ferrous sulfate  325 (65 FE) MG tablet Take 1 tablet (325 mg total) by mouth daily with breakfast.   loratadine  10 MG tablet Commonly known as: CLARITIN  Take 10 mg by mouth in the morning.   Melatonin 10 MG Tabs Take 10 mg by mouth at bedtime.   metroNIDAZOLE 500 MG tablet Commonly known as: FLAGYL Take 1 tablet (500 mg total) by mouth 2 (two) times daily for 4 days.   rosuvastatin  5 MG tablet Commonly known as: CRESTOR  Take 5 mg by mouth in the morning.   traMADol  50 MG tablet Commonly known as: Ultram  Take 1 tablet (50 mg total) by mouth every 6 (six) hours as needed for moderate pain (pain score 4-6) or severe pain (pain score 7-10).   venlafaxine  XR 37.5 MG 24 hr capsule Commonly known as: EFFEXOR -XR Take 37.5 mg by mouth in the morning.   vitamin D3 50 MCG (2000 UT) Caps Take 2,000 Units by mouth in the morning.        Discharge Exam:    Subjective: Feeling ok, pain is improved, still feels weak.   Objective: Vitals:   07/30/24 0733 07/30/24 1222  BP: (!) 140/72 (!) 158/84  Pulse: 75 69  Resp:    Temp: 98.2 F (36.8 C) 98.3 F (36.8 C)  SpO2: 99% 98%    Intake/Output Summary (Last 24 hours) at 07/30/2024 1317 Last data filed at 07/30/2024 1247 Gross per 24 hour  Intake 360 ml  Output --   Net 360 ml   Filed Weights   07/28/24 1628  Weight: 97.6 kg    Exam:  General:  Appears calm and comfortable and is in NAD Eyes:  normal lids, iris ENT:  grossly normal hearing, lips & tongue, mmm Cardiovascular:  RRR. Chronic lymphedema, mild.  Respiratory:   CTA bilaterally with no wheezes/rales/rhonchi.  Normal respiratory effort. Abdomen:  soft, minimally TTP, ND Skin:  no rash or induration seen on limited exam Musculoskeletal:  grossly normal tone BUE/BLE, good ROM, no bony abnormality Psychiatric:  grossly normal mood and affect, speech fluent and appropriate, AOx3 Neurologic:  CN 2-12 grossly intact, moves all extremities in coordinated fashion  Data Reviewed: I have reviewed the patient's lab results since admission.  Pertinent labs for today include:   None today    Condition at discharge: stable  The results of significant diagnostics from this hospitalization (including imaging, microbiology, ancillary and laboratory) are listed below for reference.   Imaging Studies: CT HEAD WO CONTRAST ( ) Result Date: 07/27/2024 CLINICAL DATA:  Headaches and tachycardia EXAM: CT HEAD WITHOUT CONTRAST TECHNIQUE: Contiguous  axial images were obtained from the base of the skull through the vertex without intravenous contrast. RADIATION DOSE REDUCTION: This exam was performed according to the departmental dose-optimization program which includes automated exposure control, adjustment of the mA and/or kV according to patient size and/or use of iterative reconstruction technique. COMPARISON:  None Available. FINDINGS: Brain: No evidence of acute infarction, hemorrhage, hydrocephalus, extra-axial collection or mass lesion/mass effect. Mild atrophic changes are noted. Vascular: No hyperdense vessel or unexpected calcification. Skull: Normal. Negative for fracture or focal lesion. Sinuses/Orbits: No acute finding. Other: None. IMPRESSION: Mild atrophic changes without acute abnormality.  Electronically Signed   By: Oneil Devonshire M.D.   On: 07/27/2024 22:50   CT ABDOMEN PELVIS W CONTRAST Result Date: 07/27/2024 CLINICAL DATA:  Left lower quadrant pain EXAM: CT ABDOMEN AND PELVIS WITH CONTRAST TECHNIQUE: Multidetector CT imaging of the abdomen and pelvis was performed using the standard protocol following bolus administration of intravenous contrast. RADIATION DOSE REDUCTION: This exam was performed according to the departmental dose-optimization program which includes automated exposure control, adjustment of the mA and/or kV according to patient size and/or use of iterative reconstruction technique. CONTRAST:  100mL OMNIPAQUE IOHEXOL 300 MG/ML  SOLN COMPARISON:  None Available. FINDINGS: Lower chest: No acute abnormality.  Heart is enlarged in size. Hepatobiliary: No focal liver abnormality is seen. No gallstones, gallbladder wall thickening, or biliary dilatation. Pancreas: Unremarkable. No pancreatic ductal dilatation or surrounding inflammatory changes. Spleen: Normal in size without focal abnormality. Adrenals/Urinary Tract: Adrenal glands are within normal limits. No renal calculi or obstructive changes are noted. Delayed images demonstrate normal excretion of contrast. Small right renal cyst is noted. No follow-up is recommended. Bladder is partially distended but somewhat obscured by adjacent scatter artifact from the patient's left hip replacement. Stomach/Bowel: Thickening inflammatory change of the colon is noted in the descending colon distally with extension into the sigmoid colon consistent with focal diverticulitis. No perforation or abscess formation is identified. More proximal colon appears within normal limits. The appendix is not well seen consistent with prior surgical history. Small bowel and stomach are unremarkable. Vascular/Lymphatic: Aortic atherosclerosis. No enlarged abdominal or pelvic lymph nodes. Reproductive: Status post hysterectomy. No adnexal masses. Other: No  abdominal wall hernia or abnormality. No abdominopelvic ascites. Musculoskeletal: Left hip replacement is noted. Degenerative changes of lumbar spine are seen. IMPRESSION: Inflammatory changes in the distal descending and proximal sigmoid colons consistent with focal diverticulitis. No abscess or perforation is noted. Electronically Signed   By: Oneil Devonshire M.D.   On: 07/27/2024 22:49   DG Chest 2 View Result Date: 07/27/2024 CLINICAL DATA:  Shortness of breath. Heart racing since last night. History of atrial fibrillation on Eliquis . Left lower quadrant pain with frequent urination for 2 weeks. EXAM: CHEST - 2 VIEW COMPARISON:  01/04/2023 FINDINGS: Cardiac enlargement with mild perihilar infiltration, possibly due to edema. No pleural effusion or pneumothorax. No focal consolidation. Mediastinal contours appear intact. Degenerative changes in the spine. Postoperative change in the right shoulder. IMPRESSION: Cardiac enlargement with evidence of mild perihilar edema. Electronically Signed   By: Elsie Gravely M.D.   On: 07/27/2024 20:07    Microbiology: Results for orders placed or performed during the hospital encounter of 07/27/24  Urine Culture     Status: Abnormal   Collection Time: 07/27/24  8:37 PM   Specimen: Urine, Random  Result Value Ref Range Status   Specimen Description   Final    URINE, RANDOM Performed at [facility], [address]  [place], [place] [address]    Special Requests   Final    NONE Reflexed from [id number] Performed at [facility], [address], [place], [place] [address]    Culture (A)  Final    <10,000 COLONIES/mL INSIGNIFICANT GROWTH Performed at [facility], [address] [place], [place] [address]    Report Status 07/29/2024 FINAL  Final    Labs: CBC: Recent Labs  Lab 07/27/24 1959 07/27/24 2111 07/27/24 2332 07/28/24 0442  WBC 11.9*  --   --  11.9*  NEUTROABS 9.3*  --   --   --   HGB 14.8 14.6  13.6 12.2  HCT 46.3* 43.0 40.0 39.0  MCV 96.9  --   --  97.0  PLT 216  --   --  190   Basic Metabolic Panel: Recent Labs  Lab 07/27/24 2111 07/27/24 2212 07/27/24 2332 07/28/24 0442  NA 133* 144 137 136  K 7.1* 2.9* 3.4* 3.8  CL 105 107 102 104  CO2  --  20*  --  21*  GLUCOSE 125* 110* 133* 117*  BUN 17 10 10 9   CREATININE 0.60 0.52 0.60 0.54  CALCIUM   --  8.3*  --  9.1  MG  --  1.4*  --  2.2   Liver Function Tests: Recent Labs  Lab 07/27/24 2212  AST 19  ALT 14  ALKPHOS 96  BILITOT 0.9  PROT 6.2*  ALBUMIN  3.5   CBG: Recent Labs  Lab 07/29/24 0758  GLUCAP 105*    Discharge time spent: greater than 30 minutes.  Signed: Delon Herald, MD [facility] 07/30/2024

## 2024-08-02 ENCOUNTER — Ambulatory Visit: Attending: Cardiology | Admitting: Cardiology

## 2024-08-02 ENCOUNTER — Encounter: Payer: Self-pay | Admitting: Cardiology

## 2024-08-02 VITALS — BP 144/80 | HR 83 | Resp 16 | Ht 63.0 in | Wt 159.0 lb

## 2024-08-02 DIAGNOSIS — I4821 Permanent atrial fibrillation: Secondary | ICD-10-CM

## 2024-08-02 DIAGNOSIS — I071 Rheumatic tricuspid insufficiency: Secondary | ICD-10-CM | POA: Diagnosis not present

## 2024-08-02 DIAGNOSIS — I340 Nonrheumatic mitral (valve) insufficiency: Secondary | ICD-10-CM | POA: Diagnosis not present

## 2024-08-02 DIAGNOSIS — I10 Essential (primary) hypertension: Secondary | ICD-10-CM | POA: Diagnosis not present

## 2024-08-02 NOTE — Progress Notes (Signed)
 Cardiology Office Note:  .   Date:  08/02/2024  ID:  Kathryn Ayala, DOB January 06, 1935, MRN [id number] PCP: Shayne Anes, MD  [facility] Providers Cardiologist:  Gordy Bergamo, MD   History of Present Illness: .   Kathryn Ayala is a 89 y.o.  Caucasian female with permanent atrial fibrillation, essential hypertension, hyperlipidemia, mild chronic dyspnea with a negative nuclear stress test in 2018, moderate to moderately severe MR and TR with moderate pulmonary hypertension presents here for posthospital and annual follow-up.  Admitted with diverticulitis and permanent A-fib but with RVR on 07/25/2024, treated with antibiotics and Flagyl and discharged home on a stable condition and resuming all her home medications.   She is still recuperating and finished antibiotics yesterday and generally feels weak.   She lives independently. No PND or orthopnea. Surprisingly in spite of moderate to severe mitral regurgitation, patient has endured 4 major operations, last one in Dec 2024 right shoulder surgery.   Cardiac Studies relevent.    Echocardiogram 07/02/2023:  Normal LV size and function, EF 55 to 60%, mild LVH. Severe biatrial enlargement. RV is mildly dilated with normal RV function. Mild MVP, moderate MR, severe TR, moderate pulmonary hypertension, RVSP 53 mmHg.    Discussed the use of AI scribe software for clinical note transcription with the patient, who gave verbal consent to proceed.  History of Present Illness Kathryn Ayala is an 89 year old female with atrial fibrillation and valve regurgitation who presents for cardiovascular follow-up. She is accompanied by her grandson.  She experiences significant weakness, particularly when her heart goes out of rhythm, and her heart rate increases upon standing or sitting. She takes Eliquis  5 mg twice daily and atenolol  50 mg twice daily for atrial fibrillation.  She has moderate to severe mitral regurgitation and  severe tricuspid regurgitation since 2021. Her blood pressure has been elevated since her surgeries, typically around 112-115 mmHg.  She experiences leg swelling, attributed to lymphedema, managed by leg elevation, staying off her feet, and wearing support stockings. She does not use diuretics.   Labs   Recent Labs    10/16/23 1132 07/27/24 2111 07/27/24 2212 07/27/24 2332 07/28/24 0442  NA 139   < > 144 137 136  K 3.5   < > 2.9* 3.4* 3.8  CL 108   < > 107 102 104  CO2 21*  --  20*  --  21*  GLUCOSE 88   < > 110* 133* 117*  BUN 18   < > 10 10 9   CREATININE 0.70   < > 0.52 0.60 0.54  CALCIUM  10.1  --  8.3*  --  9.1  GFRNONAA >60  --  >60  --  >60   < > = values in this interval not displayed.    Lab Results  Component Value Date   ALT 14 07/27/2024   AST 19 07/27/2024   ALKPHOS 96 07/27/2024   BILITOT 0.9 07/27/2024      Latest Ref Rng & Units 07/28/2024    4:42 AM 07/27/2024   11:32 PM 07/27/2024    9:11 PM  CBC  WBC 4.0 - 10.5 K/uL 11.9     Hemoglobin 12.0 - 15.0 g/dL 87.7  86.3  85.3   Hematocrit 36.0 - 46.0 % 39.0  40.0  43.0   Platelets 150 - 400 K/uL 190       ROS  Review of Systems  Cardiovascular:  Positive for leg swelling (stable) and palpitations (  occasional). Negative for chest pain and dyspnea on exertion.   Physical Exam:   VS:  BP (!) 144/80 (BP Location: Left Arm, Patient Position: Sitting, Cuff Size: Large)   Pulse 83   Resp 16   Ht 5' 3 (1.6 m)   Wt 159 lb (72.1 kg)   SpO2 98%   BMI 28.17 kg/m    Wt Readings from Last 3 Encounters:  08/02/24 159 lb (72.1 kg)  07/28/24 215 lb 2.7 oz (97.6 kg)  10/23/23 150 lb (68 kg)    BP Readings from Last 3 Encounters:  08/02/24 (!) 144/80  07/30/24 (!) 158/84  03/30/24 117/64   Physical Exam Neck:     Vascular: No carotid bruit or JVD.  Cardiovascular:     Rate and Rhythm: Normal rate. Rhythm irregular.     Pulses: Normal pulses and intact distal pulses.     Heart sounds: No murmur  heard. Pulmonary:     Effort: Pulmonary effort is normal.     Breath sounds: Normal breath sounds.  Abdominal:     General: Bowel sounds are normal.     Palpations: Abdomen is soft.  Musculoskeletal:     Right lower leg: No edema.     Left lower leg: No edema.  Skin:    Capillary Refill: Capillary refill takes less than 2 seconds.    EKG:         ASSESSMENT AND PLAN: .      ICD-10-CM   1. Permanent atrial fibrillation (HCC)  I48.21     2. Severe tricuspid regurgitation  I07.1     3. Moderate to severe mitral regurgitation  I34.0     4. Primary hypertension  I10      Assessment & Plan Permanent atrial fibrillation Permanent atrial fibrillation is well controlled with anticoagulation and rate control. She is on Eliquis  5 mg twice a day and atenolol  50 mg twice a day. Heart rate is well controlled, and there is no evidence of heart failure. She experiences some weakness, possibly related to heart rhythm changes, but overall, the condition is stable. - Continue Eliquis  5 mg twice a day - Continue atenolol  50 mg twice a day  Moderate mitral regurgitation and severe tricuspid regurgitation Moderate mitral regurgitation and severe tricuspid regurgitation have been stable since 2021. There is no evidence of heart failure, and she is not experiencing significant symptoms related to these conditions. The last echocardiogram in September 2024 showed stable valve regurgitation and normal heart function. - Continue current management - Consider echocardiogram at next visit if needed - In spite of significant valvular heart disease, no murmur was appreciated by me.  Mild to moderate pulmonary hypertension Mild to moderate pulmonary hypertension has been stable over the years. - Continue current management - No clinical evidence of heart failure.  Primary hypertension Blood pressure usually very well-controlled, recently going through severe abdominal discomfort and acute  inflammatory bowel disease hence medication changes were not done today.  Presently on atenolol  50 mg p.o. twice daily.  Chronic lower extremity edema with venous insufficiency Lymphedema and chronic lower extremity edema with venous insufficiency are stable. She manages the condition with diet, leg elevation, and support stockings. There is no need for diuretics at this time. - Continue management with diet, leg elevation, and support stockings  Follow up: 1 year for valvular heart disease and permanent AF and consider Echo if indicated  Signed,  Gordy Bergamo, MD, [facility] 08/02/2024, 1:24 PM [facility] [address]  [place], [place] [address] Phone: [phone]. Fax:  [phone]

## 2024-08-02 NOTE — Patient Instructions (Signed)
 Medication Instructions:  NONE *If you need a refill on your cardiac medications before your next appointment, please call your pharmacy*  Lab Work: NONE If you have labs (blood work) drawn today and your tests are completely normal, you will receive your results only by: MyChart Message (if you have MyChart) OR A paper copy in the mail If you have any lab test that is abnormal or we need to change your treatment, we will call you to review the results.  Testing/Procedures: NONE  Follow-Up: At [facility], you and your health needs are our priority.  As part of our continuing mission to provide you with exceptional heart care, our providers are all part of one team.  This team includes your primary Cardiologist (physician) and Advanced Practice Providers or APPs (Physician Assistants and Nurse Practitioners) who all work together to provide you with the care you need, when you need it.  Your next appointment:   1 Year  Provider:   Gordy Bergamo, MD    We recommend signing up for the patient portal called MyChart.  Sign up information is provided on this After Visit Summary.  MyChart is used to connect with patients for Virtual Visits (Telemedicine).  Patients are able to view lab/test results, encounter notes, upcoming appointments, etc.  Non-urgent messages can be sent to your provider as well.   To learn more about what you can do with MyChart, go to [email or web address].

## 2024-08-05 DIAGNOSIS — I130 Hypertensive heart and chronic kidney disease with heart failure and stage 1 through stage 4 chronic kidney disease, or unspecified chronic kidney disease: Secondary | ICD-10-CM | POA: Diagnosis not present

## 2024-08-05 DIAGNOSIS — I272 Pulmonary hypertension, unspecified: Secondary | ICD-10-CM | POA: Diagnosis not present

## 2024-08-05 DIAGNOSIS — E878 Other disorders of electrolyte and fluid balance, not elsewhere classified: Secondary | ICD-10-CM | POA: Diagnosis not present

## 2024-08-05 DIAGNOSIS — N1831 Chronic kidney disease, stage 3a: Secondary | ICD-10-CM | POA: Diagnosis not present

## 2024-08-05 DIAGNOSIS — I4821 Permanent atrial fibrillation: Secondary | ICD-10-CM | POA: Diagnosis not present

## 2024-08-05 DIAGNOSIS — K5792 Diverticulitis of intestine, part unspecified, without perforation or abscess without bleeding: Secondary | ICD-10-CM | POA: Diagnosis not present

## 2024-08-05 DIAGNOSIS — R630 Anorexia: Secondary | ICD-10-CM | POA: Diagnosis not present

## 2024-08-06 ENCOUNTER — Inpatient Hospital Stay (HOSPITAL_COMMUNITY)
Admission: EM | Admit: 2024-08-06 | Discharge: 2024-08-11 | DRG: 392 | Disposition: A | Discharge status: Home / Self Care | Attending: Internal Medicine | Admitting: Internal Medicine

## 2024-08-06 ENCOUNTER — Emergency Department (HOSPITAL_COMMUNITY)

## 2024-08-06 ENCOUNTER — Other Ambulatory Visit: Payer: Self-pay

## 2024-08-06 ENCOUNTER — Encounter (HOSPITAL_COMMUNITY): Payer: Self-pay | Admitting: Internal Medicine

## 2024-08-06 DIAGNOSIS — Z96653 Presence of artificial knee joint, bilateral: Secondary | ICD-10-CM | POA: Diagnosis present

## 2024-08-06 DIAGNOSIS — I129 Hypertensive chronic kidney disease with stage 1 through stage 4 chronic kidney disease, or unspecified chronic kidney disease: Secondary | ICD-10-CM | POA: Diagnosis present

## 2024-08-06 DIAGNOSIS — Z881 Allergy status to other antibiotic agents status: Secondary | ICD-10-CM | POA: Diagnosis not present

## 2024-08-06 DIAGNOSIS — I69398 Other sequelae of cerebral infarction: Secondary | ICD-10-CM

## 2024-08-06 DIAGNOSIS — Z880 Allergy status to penicillin: Secondary | ICD-10-CM

## 2024-08-06 DIAGNOSIS — F419 Anxiety disorder, unspecified: Secondary | ICD-10-CM | POA: Diagnosis present

## 2024-08-06 DIAGNOSIS — I700 Atherosclerosis of aorta: Secondary | ICD-10-CM | POA: Diagnosis present

## 2024-08-06 DIAGNOSIS — R7301 Impaired fasting glucose: Secondary | ICD-10-CM | POA: Diagnosis present

## 2024-08-06 DIAGNOSIS — E785 Hyperlipidemia, unspecified: Secondary | ICD-10-CM | POA: Diagnosis present

## 2024-08-06 DIAGNOSIS — M797 Fibromyalgia: Secondary | ICD-10-CM | POA: Diagnosis present

## 2024-08-06 DIAGNOSIS — Z96611 Presence of right artificial shoulder joint: Secondary | ICD-10-CM | POA: Diagnosis present

## 2024-08-06 DIAGNOSIS — Z853 Personal history of malignant neoplasm of breast: Secondary | ICD-10-CM | POA: Diagnosis not present

## 2024-08-06 DIAGNOSIS — M109 Gout, unspecified: Secondary | ICD-10-CM | POA: Diagnosis present

## 2024-08-06 DIAGNOSIS — Z79899 Other long term (current) drug therapy: Secondary | ICD-10-CM | POA: Diagnosis not present

## 2024-08-06 DIAGNOSIS — Z87442 Personal history of urinary calculi: Secondary | ICD-10-CM

## 2024-08-06 DIAGNOSIS — K5792 Diverticulitis of intestine, part unspecified, without perforation or abscess without bleeding: Secondary | ICD-10-CM | POA: Diagnosis not present

## 2024-08-06 DIAGNOSIS — K224 Dyskinesia of esophagus: Secondary | ICD-10-CM | POA: Diagnosis present

## 2024-08-06 DIAGNOSIS — Z882 Allergy status to sulfonamides status: Secondary | ICD-10-CM

## 2024-08-06 DIAGNOSIS — K5732 Diverticulitis of large intestine without perforation or abscess without bleeding: Principal | ICD-10-CM | POA: Diagnosis present

## 2024-08-06 DIAGNOSIS — I10 Essential (primary) hypertension: Secondary | ICD-10-CM | POA: Diagnosis present

## 2024-08-06 DIAGNOSIS — Z885 Allergy status to narcotic agent status: Secondary | ICD-10-CM

## 2024-08-06 DIAGNOSIS — K449 Diaphragmatic hernia without obstruction or gangrene: Secondary | ICD-10-CM | POA: Diagnosis not present

## 2024-08-06 DIAGNOSIS — N1831 Chronic kidney disease, stage 3a: Secondary | ICD-10-CM | POA: Diagnosis present

## 2024-08-06 DIAGNOSIS — M8440XA Pathological fracture, unspecified site, initial encounter for fracture: Secondary | ICD-10-CM | POA: Insufficient documentation

## 2024-08-06 DIAGNOSIS — H9191 Unspecified hearing loss, right ear: Secondary | ICD-10-CM | POA: Diagnosis present

## 2024-08-06 DIAGNOSIS — R109 Unspecified abdominal pain: Secondary | ICD-10-CM | POA: Diagnosis present

## 2024-08-06 DIAGNOSIS — Z7901 Long term (current) use of anticoagulants: Secondary | ICD-10-CM | POA: Diagnosis not present

## 2024-08-06 DIAGNOSIS — I80212 Phlebitis and thrombophlebitis of left iliac vein: Secondary | ICD-10-CM | POA: Diagnosis not present

## 2024-08-06 DIAGNOSIS — I480 Paroxysmal atrial fibrillation: Secondary | ICD-10-CM | POA: Diagnosis present

## 2024-08-06 DIAGNOSIS — Z96642 Presence of left artificial hip joint: Secondary | ICD-10-CM | POA: Diagnosis present

## 2024-08-06 DIAGNOSIS — Z90710 Acquired absence of both cervix and uterus: Secondary | ICD-10-CM

## 2024-08-06 DIAGNOSIS — K7689 Other specified diseases of liver: Secondary | ICD-10-CM | POA: Diagnosis not present

## 2024-08-06 LAB — CBC
HCT: 49.9 % — ABNORMAL HIGH (ref 36.0–46.0)
Hemoglobin: 15 g/dL (ref 12.0–15.0)
MCH: 29.2 pg (ref 26.0–34.0)
MCHC: 30.1 g/dL (ref 30.0–36.0)
MCV: 97.3 fL (ref 80.0–100.0)
Platelets: 291 K/uL (ref 150–400)
RBC: 5.13 MIL/uL — ABNORMAL HIGH (ref 3.87–5.11)
RDW: 14.4 % (ref 11.5–15.5)
WBC: 11.7 K/uL — ABNORMAL HIGH (ref 4.0–10.5)
nRBC: 0 % (ref 0.0–0.2)

## 2024-08-06 LAB — URINALYSIS, ROUTINE W REFLEX MICROSCOPIC
Bilirubin Urine: NEGATIVE
Glucose, UA: NEGATIVE mg/dL
Ketones, ur: NEGATIVE mg/dL
Nitrite: NEGATIVE
Protein, ur: 100 mg/dL — AB
Specific Gravity, Urine: 1.015 (ref 1.005–1.030)
pH: 5 (ref 5.0–8.0)

## 2024-08-06 LAB — COMPREHENSIVE METABOLIC PANEL WITH GFR
ALT: 42 U/L (ref 0–44)
AST: 51 U/L — ABNORMAL HIGH (ref 15–41)
Albumin: 4.3 g/dL (ref 3.5–5.0)
Alkaline Phosphatase: 120 U/L (ref 38–126)
Anion gap: 13 (ref 5–15)
BUN: 14 mg/dL (ref 8–23)
CO2: 21 mmol/L — ABNORMAL LOW (ref 22–32)
Calcium: 10 mg/dL (ref 8.9–10.3)
Chloride: 99 mmol/L (ref 98–111)
Creatinine, Ser: 0.7 mg/dL (ref 0.44–1.00)
GFR, Estimated: >60 mL/min (ref >60)
Glucose, Bld: 125 mg/dL — ABNORMAL HIGH (ref 70–99)
Potassium: 4.6 mmol/L (ref 3.5–5.1)
Sodium: 133 mmol/L — ABNORMAL LOW (ref 135–145)
Total Bilirubin: 0.9 mg/dL (ref 0.0–1.2)
Total Protein: 7.9 g/dL (ref 6.5–8.1)

## 2024-08-06 LAB — LIPASE, BLOOD: Lipase: 41 U/L (ref 11–51)

## 2024-08-06 MED ORDER — TRAMADOL HCL 50 MG PO TABS
50.0000 mg | ORAL_TABLET | Freq: Four times a day (QID) | ORAL | Status: DC | PRN
  Administered 2024-08-06: 50 mg via ORAL
  Filled 2024-08-06 (×2): qty 1

## 2024-08-06 MED ORDER — ONDANSETRON HCL 4 MG/2ML IJ SOLN: 4.0000 mg | Freq: Four times a day (QID) | INTRAMUSCULAR | Status: DC | PRN

## 2024-08-06 MED ORDER — ONDANSETRON HCL 4 MG PO TABS
4.0000 mg | ORAL_TABLET | Freq: Four times a day (QID) | ORAL | Status: DC | PRN
  Administered 2024-08-09: 4 mg via ORAL
  Filled 2024-08-06: qty 1

## 2024-08-06 MED ORDER — SODIUM CHLORIDE 0.9 % IV SOLN
2.0000 g | Freq: Every day | INTRAVENOUS | Status: DC
  Administered 2024-08-06 – 2024-08-10 (×5): 2 g via INTRAVENOUS
  Filled 2024-08-06 (×5): qty 20

## 2024-08-06 MED ORDER — HYDROXYZINE HCL 10 MG PO TABS
10.0000 mg | ORAL_TABLET | Freq: Once | ORAL | Status: AC
  Administered 2024-08-06: 10 mg via ORAL
  Filled 2024-08-06: qty 1

## 2024-08-06 MED ORDER — ROSUVASTATIN CALCIUM 5 MG PO TABS
5.0000 mg | ORAL_TABLET | Freq: Every day | ORAL | Status: DC
  Administered 2024-08-07 – 2024-08-11 (×5): 5 mg via ORAL
  Filled 2024-08-06 (×5): qty 1

## 2024-08-06 MED ORDER — FENTANYL CITRATE (PF) 50 MCG/ML IJ SOSY
25.0000 ug | PREFILLED_SYRINGE | INTRAMUSCULAR | Status: DC | PRN
Start: 2024-08-06 — End: 2024-08-11
  Administered 2024-08-06: 25 ug via INTRAVENOUS
  Filled 2024-08-06: qty 1

## 2024-08-06 MED ORDER — APIXABAN 5 MG PO TABS
5.0000 mg | ORAL_TABLET | Freq: Two times a day (BID) | ORAL | Status: DC
  Administered 2024-08-06 – 2024-08-11 (×10): 5 mg via ORAL
  Filled 2024-08-06 (×10): qty 1

## 2024-08-06 MED ORDER — VENLAFAXINE HCL ER 37.5 MG PO CP24
37.5000 mg | ORAL_CAPSULE | Freq: Every day | ORAL | Status: DC
  Administered 2024-08-07 – 2024-08-11 (×5): 37.5 mg via ORAL
  Filled 2024-08-06 (×5): qty 1

## 2024-08-06 MED ORDER — LORATADINE 10 MG PO TABS
10.0000 mg | ORAL_TABLET | Freq: Every day | ORAL | Status: DC
  Administered 2024-08-07 – 2024-08-11 (×5): 10 mg via ORAL
  Filled 2024-08-06 (×5): qty 1

## 2024-08-06 MED ORDER — METRONIDAZOLE 500 MG/100ML IV SOLN
500.0000 mg | Freq: Two times a day (BID) | INTRAVENOUS | Status: DC
  Administered 2024-08-06 – 2024-08-10 (×10): 500 mg via INTRAVENOUS
  Filled 2024-08-06 (×10): qty 100

## 2024-08-06 MED ORDER — ALLOPURINOL 100 MG PO TABS
100.0000 mg | ORAL_TABLET | Freq: Two times a day (BID) | ORAL | Status: DC
  Administered 2024-08-06 – 2024-08-11 (×10): 100 mg via ORAL
  Filled 2024-08-06 (×10): qty 1

## 2024-08-06 MED ORDER — ACETAMINOPHEN 650 MG RE SUPP: 650.0000 mg | Freq: Four times a day (QID) | RECTAL | Status: DC | PRN

## 2024-08-06 MED ORDER — ATENOLOL 50 MG PO TABS
50.0000 mg | ORAL_TABLET | Freq: Two times a day (BID) | ORAL | Status: DC
  Administered 2024-08-06 – 2024-08-11 (×10): 50 mg via ORAL
  Filled 2024-08-06 (×10): qty 1

## 2024-08-06 MED ORDER — ACETAMINOPHEN 325 MG PO TABS
650.0000 mg | ORAL_TABLET | Freq: Four times a day (QID) | ORAL | Status: DC | PRN
  Administered 2024-08-06 – 2024-08-11 (×7): 650 mg via ORAL
  Filled 2024-08-06 (×7): qty 2

## 2024-08-06 MED ADMIN — Iohexol Inj 300 MG/ML: 100 mL | INTRAVENOUS | NDC 00407141363

## 2024-08-06 NOTE — ED Provider Notes (Signed)
 [place] EMERGENCY DEPARTMENT AT [facility] Provider Note   CSN: [id number] Arrival date & time: 08/06/24  9194     Patient presents with: Abdominal Pain and Diarrhea   Kathryn Ayala is a 89 y.o. female.    Abdominal Pain Associated symptoms: diarrhea   Diarrhea Associated symptoms: abdominal pain   Patient presents abdominal pain and diarrhea.  Recent admission to hospital for diverticulitis.  Discharged a week ago.  Has finished up antibiotics but now increasing pain.  Pain had been doing better but then recurred.  Pain in the lower abdomen similar to previous diverticulitis.  Now having increased diarrhea also.     Prior to Admission medications   Medication Sig Start Date End Date Taking? Authorizing Provider  acetaminophen  (TYLENOL ) 500 MG tablet Take 1,000 mg by mouth every 6 (six) hours as needed for moderate pain.    [provider]  allopurinol  (ZYLOPRIM ) 100 MG tablet Take 100 mg by mouth 2 (two) times daily.  03/04/16   [provider]  apixaban  (ELIQUIS ) 5 MG TABS tablet Take 1 tablet (5 mg total) by mouth 2 (two) times daily. 07/30/24   Barbarann Nest, MD  atenolol  (TENORMIN ) 50 MG tablet Take 50 mg by mouth 2 (two) times daily. 05/05/16   [provider]  B Complex-C (B-COMPLEX WITH VITAMIN C) tablet Take 1 tablet by mouth daily. 11/12/22   Patel, Pranav M, MD  Biotin (BIOTIN 5000) 5 MG CAPS Take 5 mg by mouth in the morning.    [provider]  Cholecalciferol (VITAMIN D3) 50 MCG (2000 UT) CAPS Take 2,000 Units by mouth in the morning.    [provider]  feeding supplement (ENSURE PLUS HIGH PROTEIN) LIQD Take 237 mLs by mouth 2 (two) times daily between meals. 07/30/24   Barbarann Nest, MD  ferrous sulfate  325 (65 FE) MG tablet Take 1 tablet (325 mg total) by mouth daily with breakfast. 11/11/22 10/06/25  Tobie Yetta HERO, MD  loratadine  (CLARITIN ) 10 MG tablet Take 10 mg by mouth in the morning.    [provider]  Melatonin 10 MG TABS Take 10 mg by mouth at bedtime.    [provider]  rosuvastatin  (CRESTOR ) 5 MG tablet Take 5 mg by mouth in the morning.    [provider]  traMADol  (ULTRAM ) 50 MG tablet Take 1 tablet (50 mg total) by mouth every 6 (six) hours as needed for moderate pain (pain score 4-6) or severe pain (pain score 7-10). 10/23/23 10/22/24  Kay Kemps, MD  venlafaxine  XR (EFFEXOR -XR) 37.5 MG 24 hr capsule Take 37.5 mg by mouth in the morning.    [provider]    Allergies: Hydrocodone , Penicillins, Azithromycin, Codeine, Levofloxacin, and Sulfa antibiotics    Review of Systems  Gastrointestinal:  Positive for abdominal pain and diarrhea.    Updated Vital Signs BP (!) 168/86 (BP Location: Right Arm)   Pulse 89   Temp 98.3 F (36.8 C) (Oral)   Resp 18   SpO2 100%   Physical Exam Vitals and nursing note reviewed.  Cardiovascular:     Rate and Rhythm: Regular rhythm.  Abdominal:     Tenderness: There is abdominal tenderness.     Comments: Lower abdominal tenderness particular left lower quadrant without rebound or guarding.  No hernia palpated.  Neurological:     Mental Status: She is alert.     (all labs ordered are listed, but only abnormal results are displayed) Labs Reviewed  COMPREHENSIVE  METABOLIC PANEL WITH GFR - Abnormal; Notable for the following components:      Result Value   Sodium 133 (*)    CO2 21 (*)    Glucose, Bld 125 (*)    AST 51 (*)    All other components within normal limits  CBC - Abnormal; Notable for the following components:   WBC 11.7 (*)    RBC 5.13 (*)    HCT 49.9 (*)    All other components within normal limits  URINALYSIS, ROUTINE W REFLEX MICROSCOPIC - Abnormal; Notable for the following components:   Hgb urine dipstick SMALL (*)    Protein, ur 100 (*)    Leukocytes,Ua SMALL (*)    Bacteria, UA RARE (*)    All other components within normal limits  LIPASE, BLOOD     EKG: None  Radiology: CT ABDOMEN PELVIS W CONTRAST Result Date: 08/06/2024 CLINICAL DATA:  LLQ abdominal pain EXAM: CT ABDOMEN AND PELVIS WITH CONTRAST TECHNIQUE: Multidetector CT imaging of the abdomen and pelvis was performed using the standard protocol following bolus administration of intravenous contrast. RADIATION DOSE REDUCTION: This exam was performed according to the departmental dose-optimization program which includes automated exposure control, adjustment of the mA and/or kV according to patient size and/or use of iterative reconstruction technique. CONTRAST:  100mL OMNIPAQUE IOHEXOL 300 MG/ML  SOLN COMPARISON:  July 27, 2024 FINDINGS: Evaluation is limited by streak artifact from hip arthroplasty. Lower chest: Cardiomegaly. Hepatobiliary: Gallbladder is unremarkable. Subtle nodularity of the liver contours. No focal hypoenhancing mass within the limitations of the exam. No extrahepatic biliary ductal dilation. Pancreas: Unremarkable. No pancreatic ductal dilatation or surrounding inflammatory changes. Spleen: Unremarkable. Adrenals/Urinary Tract: Adrenal glands are unremarkable. Kidneys enhance symmetrically. No hydronephrosis. Bladder is unremarkable within the limitations of the exam. Distal LEFT ureter is not visualized as it traverses past inflamed loop of colon in the LEFT pelvis. Stomach/Bowel: Revisualization of circumferential wall thickening with adjacent fat stranding along the confluence of the sigmoid colon with the descending colon. There is asymmetric peritoneal enhancement in this area likely reflecting a degree of peritonitis. There is small volume free fluid. No definitive free air. Multiple diverticuli are noted throughout this area. Moderate hiatal hernia. No evidence of bowel obstruction. Appendix is surgically absent. Vascular/Lymphatic: Atherosclerotic calcifications of the nonaneurysmal abdominal aorta. Question asymmetric expansion of the LEFT external iliac vein as  it passes by the inflamed loop of colon. Reproductive: Status post hysterectomy. No adnexal masses. Other: Small volume free fluid in the pelvis. No definitive free air. Musculoskeletal: Asymmetric low-density within the LEFT psoas is favored to be due to fatty infiltration/atrophy no definitive retroperitoneal abscess at this time. Status post LEFT hip arthroplasty. Degenerative changes of the lumbar spine. IMPRESSION: 1. Revisualization of circumferential wall thickening with adjacent fat stranding along the confluence of the sigmoid colon with the descending colon. There is asymmetric peritoneal enhancement in this area likely reflecting a degree of peritonitis. There is small volume free fluid in the pelvis. No definitive free air or focal drainable fluid collection. Findings are favored to reflect acute diverticulitis. Recommend consideration of follow-up colonoscopy after resolution of acute symptoms to exclude underlying mass. 2. Question asymmetric expansion of the LEFT external iliac vein as it passes by the inflamed loop of colon. This could be artifactual in etiology but could reflect a reactive thrombosis. Consider correlation with DVT ultrasound. 3. Subtle nodularity of the liver contours could reflect underlying cirrhosis. Aortic Atherosclerosis (ICD10-I70.0). Electronically Signed   By: Corean Salter  M.D.   On: 08/06/2024 10:58     Procedures   Medications Ordered in the ED  iohexol (OMNIPAQUE) 300 MG/ML solution 100 mL (100 mLs Intravenous Contrast Given 08/06/24 1030)                                    Medical Decision Making Amount and/or Complexity of Data Reviewed Labs: ordered. Radiology: ordered.  Risk Prescription drug management. Decision regarding hospitalization.   Patient with abdominal pain.  Diarrhea.  Recent diagnosis of diverticulitis.  Had been doing better then recurred once finishing antibiotics.  Has had some diarrhea.  Also states she has had black  stool.  Reviewed hospital note.  May have recurrent of diverticulitis or complication such as abscess.  Will get blood work and CT scan to evaluate.  White count still elevated.  CT scan shows continued or recurrent diverticulitis.  Patient states she really could not eat yesterday.  I think with increased pain and decreased oral intake patient benefit from admission to hospital.  Does not appear septic at this time.  Will discuss with hospitalist.      Final diagnoses:  Recurrent diverticulitis    ED Discharge Orders     None          Patsey Lot, MD 08/06/24 1128

## 2024-08-06 NOTE — ED Triage Notes (Signed)
 Patient in today reporting ongoing abd pain with diarrhea. Reports recent diagnosis of diverticulitis and placed on cipro and flagyl with which she finished on Wednesday.

## 2024-08-06 NOTE — H&P (Signed)
 History and Physical    Patient: Kathryn Ayala FMW:[id number] DOB: 12-May-1935 DOA: 08/06/2024 DOS: the patient was seen and examined on 08/06/2024 PCP: Shayne Anes, MD  Patient coming from: Home  Chief Complaint:  Chief Complaint  Patient presents with   Abdominal Pain   Diarrhea   HPI: Kathryn Ayala is a 89 y.o. 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  She initially received ceftriaxone and metronidazole.  Then she was discharged home on ciprofloxacin and oral Flagyl, but her symptoms have worsened again.  She is again having fecal tenesmus and frequent loose stools.  Denied melena or hematochezia.  She denied fever, chills, rhinorrhea, sore throat, wheezing or hemoptysis.  No chest pain, palpitations, diaphoresis, PND, orthopnea or pitting edema of the lower extremities.  No flank pain, dysuria, frequency or hematuria.  No polyuria, polydipsia, polyphagia or blurred vision.   Lab work: Urinalysis shows small hemoglobin, small leukocyte esterase and protein of 100 mg/dL along with rare bacteria on microscopic examination.  Only 6-10 WBCs.  CBC showed a white count 11.7, hemoglobin 15.0 g/dL platelets 708.  Lipase is normal.  CMP showed a glucose of 125 mg/dL, AST of 51 units/L, sodium 133 and CO2 of 21 mmol/L with a normal anion gap.  The rest of the electrolytes, the rest of the LFTs and renal function were normal.  Imaging: CT abdomen/pelvis with contrast again showing circumferential wall thickening with adjacent fat stranding along the confluence of the sigmoid colon with the descending colon  with asymmetric pericolonic enhancement in this area reflecting a degree of peritonitis.  Small volume free fluid in the pelvis.  No definite free air or focal drainable fluid collection.  Findings are favored to reflect acute diverticulitis.  Follow-up colonoscopy recommended to ensure there is no underlying mass.  Question iliac vein reactive thrombosis, but likely this might be artifactual.  Consider correlation with DVT ultrasound.  Subtle nodularity of the liver contours could reflect cirrhosis.  Aortic atherosclerosis.   ED course: Initial vital signs were temperature 98.3 F, pulse 89, respiration 18, BP 68 over 86 mmHg and O2 sat 100% on room air.  Review of Systems: As mentioned in the history of present illness. All other systems reviewed and are negative. 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   Tachycardia    Urinary frequency    Past Surgical History:  Procedure Laterality Date   ABDOMINAL HYSTERECTOMY  1996   APPENDECTOMY  1956   BREAST BIOPSY Left 05/07/2016   BREAST BIOPSY Bilateral 1956, 1980   BREAST LUMPECTOMY WITH RADIOACTIVE SEED LOCALIZATION Left 06/16/2016   Procedure: LEFT BREAST LUMPECTOMY WITH RADIOACTIVE SEED LOCALIZATION;  Surgeon: Deward Null III,  MD;  Location: [facility] OR;  Service: General;  Laterality: Left;   COLONOSCOPY     KIDNEY STONE SURGERY     KNEE SURGERY  11/20/2004   REVERSE SHOULDER ARTHROPLASTY Right 10/23/2023   Procedure: REVERSE SHOULDER ARTHROPLASTY;  Surgeon: Kay Kemps, MD;  Location: [facility];  Service: Orthopedics;  Laterality: Right;  interscalene block  110    TOTAL HIP ARTHROPLASTY Left 11/09/2022   Procedure: TOTAL HIP ARTHROPLASTY ANTERIOR APPROACH;  Surgeon: Ernie Cough, MD;  Location: [facility];  Service: Orthopedics;  Laterality: Left;   TOTAL KNEE ARTHROPLASTY Right 04/07/2022   Procedure: TOTAL KNEE ARTHROPLASTY;  Surgeon: Melodi Lerner, MD;  Location: [facility];  Service: Orthopedics;  Laterality: Right;   TOTAL KNEE ARTHROPLASTY Left 09/22/2022   Procedure: TOTAL KNEE ARTHROPLASTY;  Surgeon: Melodi Lerner, MD;  Location: [facility];  Service: Orthopedics;  Laterality: Left;   Social History:  reports that she has never smoked. She has never been exposed to tobacco smoke. She has never used smokeless tobacco. She reports that she does not drink alcohol and does not use drugs.  Allergies  Allergen Reactions   Hydrocodone  Other (See Comments)    Makes very confused  Other Reaction(s): Not available  hydrocodone    Penicillins Anaphylaxis, Swelling and Rash    Has patient had a PCN reaction causing immediate rash, facial/tongue/throat swelling, SOB or lightheadedness with hypotension: Yes Has patient had a PCN reaction causing severe rash involving mucus membranes or skin necrosis: Yes Has patient had a PCN reaction that required hospitalization No Has patient had a PCN reaction occurring within the last 10 years: No If all of the above answers are NO, then may proceed with Cephalosporin use.    Azithromycin Other (See Comments)    PT is unsure of reaction   Codeine Other (See Comments)    Patient was told to not take this   Levofloxacin Other (See Comments)    UNSPECIFIED REACTION  Patient was told not to take it.  Other Reaction(s): Not available  levofloxacin   Sulfa Antibiotics Itching, Rash and Dermatitis    Family History  Problem Relation Age of Onset   Lung cancer Brother    Stroke Mother    Pneumonia Father    Cancer Brother     Prior to Admission medications   Medication Sig Start Date End Date Taking? Authorizing  Provider  acetaminophen  (TYLENOL ) 500 MG tablet Take 1,000 mg by mouth every 6 (six) hours as needed for moderate pain.    [provider]  allopurinol  (ZYLOPRIM ) 100 MG tablet Take 100 mg by mouth 2 (two) times daily.  03/04/16   [provider]  apixaban  (ELIQUIS ) 5 MG TABS tablet Take 1 tablet (5 mg total) by mouth 2 (two) times daily. 07/30/24   Barbarann Nest, MD  atenolol  (TENORMIN ) 50 MG tablet Take 50 mg by mouth 2 (two) times daily. 05/05/16   [provider]  B Complex-C (B-COMPLEX WITH VITAMIN C) tablet Take 1 tablet by mouth daily. 11/12/22   Patel, Pranav M, MD  Biotin (BIOTIN 5000) 5 MG CAPS Take 5 mg by mouth in the morning.    [provider]  Cholecalciferol (VITAMIN D3) 50 MCG (2000 UT) CAPS Take 2,000 Units by mouth in the morning.    [provider]  feeding supplement (ENSURE PLUS HIGH PROTEIN) LIQD Take 237 mLs by mouth 2 (two) times daily between meals. 07/30/24   Barbarann Nest, MD  ferrous sulfate  325 (65 FE) MG tablet Take  1 tablet (325 mg total) by mouth daily with breakfast. 11/11/22 10/06/25  Tobie Yetta HERO, MD  loratadine  (CLARITIN ) 10 MG tablet Take 10 mg by mouth in the morning.    [provider]  Melatonin 10 MG TABS Take 10 mg by mouth at bedtime.    [provider]  rosuvastatin  (CRESTOR ) 5 MG tablet Take 5 mg by mouth in the morning.    [provider]  traMADol  (ULTRAM ) 50 MG tablet Take 1 tablet (50 mg total) by mouth every 6 (six) hours as needed for moderate pain (pain score 4-6) or severe pain (pain score 7-10). 10/23/23 10/22/24  Kay Kemps, MD  venlafaxine  XR (EFFEXOR -XR) 37.5 MG 24 hr capsule Take 37.5 mg by mouth in the morning.    [provider]    Physical Exam: Vitals:   08/06/24 0812 08/06/24 1127  BP: (!) 168/86 (!) 161/75  Pulse: 89 91  Resp: 18 18  Temp: 98.3 F (36.8 C) 98.3 F (36.8 C)  TempSrc: Oral Oral  SpO2: 100% 99%   Physical Exam Vitals and  nursing note reviewed.  Constitutional:      General: She is awake. She is not in acute distress.    Appearance: She is well-developed. She is ill-appearing.  HENT:     Head: Normocephalic.     Nose: No rhinorrhea.     Mouth/Throat:     Mouth: Mucous membranes are dry.  Eyes:     General: No scleral icterus.    Pupils: Pupils are equal, round, and reactive to light.  Neck:     Vascular: No JVD.  Cardiovascular:     Rate and Rhythm: Normal rate and regular rhythm.     Heart sounds: S1 normal and S2 normal.  Pulmonary:     Effort: Pulmonary effort is normal.     Breath sounds: Normal breath sounds. No wheezing, rhonchi or rales.  Abdominal:     General: Bowel sounds are normal.     Palpations: Abdomen is soft.     Tenderness: There is abdominal tenderness in the left lower quadrant. There is left CVA tenderness. There is no right CVA tenderness, guarding or rebound.  Musculoskeletal:     Cervical back: Neck supple.  Skin:    General: Skin is warm and dry.  Neurological:     General: No focal deficit present.     Mental Status: She is alert.  Psychiatric:        Mood and Affect: Mood normal.        Behavior: Behavior normal. Behavior is cooperative.     Data Reviewed:  Results are pending, will review when available.  Assessment and Plan: Principal Problem:   Abdominal pain In the setting of:   Acute diverticulitis Observation/telemetry. Soft diet. Analgesics as needed. Antiemetics as needed. Restart ceftriaxone 2 g IVPB daily. Restart metronidazole 500 mg IVPB every 12 hours. Pantoprazole 40 mg IVP daily. Follow CBC, CMP and lipase in AM.  Active Problems:   Paroxysmal atrial fibrillation (HCC) Continue apixaban  5 mg p.o. twice daily. Continue atenolol  50 mg p.o. daily.    Chronic kidney disease, stage 3a (HCC) Monitor renal function and electrolytes.    Primary hypertension Continue atenolol  as above. Monitor blood pressure and heart rate.     Hyperlipidemia Continue rosuvastatin  5 mg p.o. daily.    Gout Continue allopurinol  100 mg p.o. daily.    Impaired fasting glucose Follow-up fasting blood glucose.      Advance Care Planning:  Code Status: Full Code   Consults:   Family Communication:   Severity of Illness: The appropriate patient status for this patient is INPATIENT. Inpatient status is judged to be reasonable and necessary in order to provide the required intensity of service to ensure the patient's safety. The patient's presenting symptoms, physical exam findings, and initial radiographic and laboratory data in the context of their chronic comorbidities is felt to place them at high risk for further clinical deterioration. Furthermore, it is not anticipated that the patient will be medically stable for discharge from the hospital within 2 midnights of admission.   * I certify that at the point of admission it is my clinical judgment that the patient will require inpatient hospital care spanning beyond 2 midnights from the point of admission due to high intensity of service, high risk for further deterioration and high frequency of surveillance required.*  Author: Alm Dorn Castor, MD 08/06/2024 11:38 AM  For on call review [email or web address].   This document was prepared using Dragon voice recognition software and may contain some unintended transcription errors.

## 2024-08-06 NOTE — Plan of Care (Signed)
  Problem: Education: Goal: Knowledge of General Education information will improve Description: Including pain rating scale, medication(s)/side effects and non-pharmacologic comfort measures Outcome: Progressing   Problem: Health Behavior/Discharge Planning: Goal: Ability to manage health-related needs will improve Outcome: Progressing   Problem: Clinical Measurements: Goal: Ability to maintain clinical measurements within normal limits will improve Outcome: Progressing Goal: Will remain free from infection Outcome: Progressing Goal: Diagnostic test results will improve Outcome: Progressing Goal: Respiratory complications will improve Outcome: Progressing Goal: Cardiovascular complication will be avoided Outcome: Progressing   Problem: Nutrition: Goal: Adequate nutrition will be maintained Outcome: Progressing   Problem: Coping: Goal: Level of anxiety will decrease Outcome: Progressing   Problem: Pain Managment: Goal: General experience of comfort will improve and/or be controlled Outcome: Progressing   Problem: Safety: Goal: Ability to remain free from injury will improve Outcome: Progressing   Problem: Skin Integrity: Goal: Risk for impaired skin integrity will decrease Outcome: Progressing

## 2024-08-07 ENCOUNTER — Inpatient Hospital Stay (HOSPITAL_COMMUNITY)

## 2024-08-07 DIAGNOSIS — I80212 Phlebitis and thrombophlebitis of left iliac vein: Secondary | ICD-10-CM | POA: Diagnosis not present

## 2024-08-07 DIAGNOSIS — K5792 Diverticulitis of intestine, part unspecified, without perforation or abscess without bleeding: Secondary | ICD-10-CM | POA: Diagnosis not present

## 2024-08-07 LAB — CBC
HCT: 44.6 % (ref 36.0–46.0)
Hemoglobin: 14.2 g/dL (ref 12.0–15.0)
MCH: 30.7 pg (ref 26.0–34.0)
MCHC: 31.8 g/dL (ref 30.0–36.0)
MCV: 96.3 fL (ref 80.0–100.0)
Platelets: 268 K/uL (ref 150–400)
RBC: 4.63 MIL/uL (ref 3.87–5.11)
RDW: 14.6 % (ref 11.5–15.5)
WBC: 9.9 K/uL (ref 4.0–10.5)
nRBC: 0 % (ref 0.0–0.2)

## 2024-08-07 LAB — COMPREHENSIVE METABOLIC PANEL WITH GFR
ALT: 28 U/L (ref 0–44)
AST: 28 U/L (ref 15–41)
Albumin: 3.9 g/dL (ref 3.5–5.0)
Alkaline Phosphatase: 111 U/L (ref 38–126)
Anion gap: 12 (ref 5–15)
BUN: 12 mg/dL (ref 8–23)
CO2: 23 mmol/L (ref 22–32)
Calcium: 9.7 mg/dL (ref 8.9–10.3)
Chloride: 103 mmol/L (ref 98–111)
Creatinine, Ser: 0.64 mg/dL (ref 0.44–1.00)
GFR, Estimated: >60 mL/min (ref >60)
Glucose, Bld: 108 mg/dL — ABNORMAL HIGH (ref 70–99)
Potassium: 4.1 mmol/L (ref 3.5–5.1)
Sodium: 137 mmol/L (ref 135–145)
Total Bilirubin: 0.7 mg/dL (ref 0.0–1.2)
Total Protein: 7 g/dL (ref 6.5–8.1)

## 2024-08-07 MED ORDER — HYDROXYZINE HCL 10 MG PO TABS
10.0000 mg | ORAL_TABLET | Freq: Once | ORAL | Status: AC
  Administered 2024-08-07: 10 mg via ORAL
  Filled 2024-08-07: qty 1

## 2024-08-07 MED ORDER — ALUM & MAG HYDROXIDE-SIMETH 200-200-20 MG/5ML PO SUSP
30.0000 mL | Freq: Four times a day (QID) | ORAL | Status: DC | PRN
Start: 2024-08-07 — End: 2024-08-11
  Administered 2024-08-07: 30 mL via ORAL
  Filled 2024-08-07: qty 30

## 2024-08-07 MED ORDER — SODIUM CHLORIDE 0.9 % IV SOLN: INTRAVENOUS | Status: AC | PRN

## 2024-08-07 NOTE — Evaluation (Signed)
 Occupational Therapy Evaluation Patient Details Name: Kathryn Ayala MRN: [id number] DOB: 16-Aug-1935 Today's Date: 08/07/2024   History of Present Illness   Pt is 89 yo female admitted on 08/06/24 with acute sigmoid descending colon diverticulitis.  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 She has been recently using a RW, but still modified independent in self care and light IADLs. Presents with generalized weakness and impaired standing balance with tendency to release her walker and walk without it. She is overall functioning at a supervision level in ambulation and ADLs. She needs min assist for LEs when returning to supine due to lower abdominal pain. Recommending HHOT upon discharge.     If plan is discharge home, recommend the following:   A little help with walking and/or transfers;A little help with bathing/dressing/bathroom;Assistance with cooking/housework;Assist for transportation;Help with stairs or ramp for entrance     Functional Status Assessment   Patient has had a recent decline in their functional status and demonstrates the ability to make significant improvements in function in a reasonable and predictable amount of time.     Equipment Recommendations   None recommended by OT     Recommendations for Other Services         Precautions/Restrictions   Precautions Precautions: Fall Recall of Precautions/Restrictions: Impaired Precaution/Restrictions Comments: walks away from [employer] Restrictions Weight Bearing Restrictions Per Provider Order: No     Mobility Bed Mobility Overal bed mobility: Needs Assistance Bed Mobility: Sit to Supine       Sit to  supine: Min assist   General bed mobility comments: assist for LEs into bed due to abdominal pain    Transfers Overall transfer level: Needs assistance Equipment used: Rolling walker (2 wheels) Transfers: Sit to/from Stand Sit to Stand: Supervision           General transfer comment: from recliner and toilet      Balance Overall balance assessment: Needs assistance Sitting-balance support: No upper extremity supported Sitting balance-Leahy Scale: Good     Standing balance support: Reliant on assistive device for balance, Bilateral upper extremity supported, During functional activity Standing balance-Leahy Scale: Poor Standing balance comment: reliant on RW for support, can release in static standing                           ADL either performed or assessed with clinical judgement   ADL Overall ADL's : Needs assistance/impaired Eating/Feeding: Independent;Sitting   Grooming: Wash/dry hands;Standing;Supervision/safety   Upper Body Bathing: Set up;Sitting   Lower Body Bathing: Supervison/ safety;Sit to/from stand   Upper Body Dressing : Set up;Sitting   Lower Body Dressing: Set up;Sit to/from stand   Toilet Transfer: Supervision/safety;Rolling walker (2 wheels);Ambulation;Comfort height toilet   Toileting- Clothing Manipulation and Hygiene: Modified independent;Sitting/lateral lean       Functional mobility during ADLs: Supervision/safety;Rolling walker (2 wheels)       Vision Baseline Vision/History: 1 Wears glasses Ability to See in Adequate Light: 0 Adequate Patient Visual Report: No change from baseline       Perception         Praxis         Pertinent Vitals/Pain Pain Assessment Pain Assessment: Faces Faces Pain Scale: Hurts little more Pain Location: lower abdomen Pain  Descriptors / Indicators: Discomfort, Grimacing, Guarding Pain Intervention(s): Monitored during session, Repositioned     Extremity/Trunk Assessment Upper  Extremity Assessment Upper Extremity Assessment: Overall WFL for tasks assessed   Lower Extremity Assessment Lower Extremity Assessment: Defer to PT evaluation RLE Deficits / Details: ROM WFL; MMT 4/5 LLE Deficits / Details: ROM WFL; MMT 4/5   Cervical / Trunk Assessment Cervical / Trunk Assessment: Normal   Communication Communication Communication: No apparent difficulties   Cognition Arousal: Alert Behavior During Therapy: WFL for tasks assessed/performed Cognition: No apparent impairments                               Following commands: Intact       Cueing  General Comments   Cueing Techniques: Verbal cues  VSS   Exercises     Shoulder Instructions      Home Living Family/patient expects to be discharged to:: Private residence Living Arrangements: Alone Available Help at Discharge: Family;Available PRN/intermittently Type of Home: House Home Access: Stairs to enter [employer] of Steps: 2 Entrance Stairs-Rails: Left Home Layout: One level     Bathroom Shower/Tub: Producer, television/film/video: Handicapped height   How Accessible: Accessible via walker Home Equipment: Grab bars - tub/shower;Grab bars - toilet;Rolling Walker (2 wheels);Shower seat;Hand held shower head;BSC/3in1;Cane - single point          Prior Functioning/Environment Prior Level of Function : Driving;Needs assist             Mobility Comments: Prior to recent admissions ambulating in community with [facility], recently using RW.  Denies falls ADLs Comments: mod I with adls and light iadls    OT Problem List: Decreased strength;Decreased activity tolerance;Impaired balance (sitting and/or standing);Pain   OT Treatment/Interventions: Self-care/ADL training;Therapeutic exercise;Energy conservation;DME and/or AE instruction;Therapeutic activities;Patient/family education;Balance training      OT Goals(Current goals can be found in the care plan section)    Acute Rehab OT Goals OT Goal Formulation: With patient Time For Goal Achievement: 08/21/24 Potential to Achieve Goals: Good ADL Goals Pt Will Transfer to Toilet: with modified independence;ambulating;regular height toilet Pt Will Perform Toileting - Clothing Manipulation and hygiene: with modified independence;sit to/from stand Additional ADL Goal #1: Pt will gather items for ADLs with RW mod I. Additional ADL Goal #2: Pt will complete basic ADLs mod I.   OT Frequency:  Min 2X/week    Co-evaluation              AM-PAC OT 6 Clicks Daily Activity     Outcome Measure Help from another person eating meals?: None Help from another person taking care of personal grooming?: A Little Help from another person toileting, which includes using toliet, bedpan, or urinal?: A Little Help from another person bathing (including washing, rinsing, drying)?: A Little Help from another person to put on and taking off regular upper body clothing?: A Little Help from another person to put on and taking off regular lower body clothing?: A Little 6 Click Score: 19   End of Session Equipment Utilized During Treatment: Gait belt;Rolling walker (2 wheels) Nurse Communication: Mobility status  Activity Tolerance: Patient tolerated treatment well Patient left: in bed;with nursing/sitter in room;with call bell/phone within reach  OT Visit Diagnosis: Unsteadiness on feet (R26.81);Muscle weakness (generalized) (M62.81)                Time: 8474-8457 OT Time Calculation (min): 17 min Charges:  OT General  Charges $OT Visit: 1 Visit OT Evaluation $OT Eval Low Complexity: 1 Low  Mliss HERO, OTR/L Acute Rehabilitation Services Office: [phone]   Kennth Mliss Helling 08/07/2024, 3:52 PM

## 2024-08-07 NOTE — Progress Notes (Signed)
 Left lower extremity venous  has been completed. Refer to [facility] under chart review to view preliminary results.   08/07/2024  11:42 AM Alexah Kivett, Ricka BIRCH

## 2024-08-07 NOTE — Progress Notes (Signed)
 PROGRESS NOTE Kathryn Ayala  FMW:[id number] DOB: 09/23/1935 DOA: 08/06/2024 PCP: Shayne Anes, MD  Brief Narrative/Hospital Course: Kathryn Ayala is a 89 y.o. 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  Labs UA ok,Only 6-10 WBCs. Wbc-11.7, hb 15.0 g/dL platelets 708.  Lipase is normal.  CMP showed a glucose of 125 mg/dL, AST of 51 units/L, sodium 133 and CO2 of 21 mmol/L with a normal anion gap.  The rest of the electrolytes, the rest of the LFTs and renal function were normal.  CT abdomen/pelvis with contrast>> again showing circumferential wall thickening with adjacent fat stranding along the confluence of the sigmoid colon with the descending colon with asymmetric pericolonic enhancement in this area reflecting a degree of peritonitis.  Small volume free fluid in the pelvis.  No definite free air or focal drainable fluid collection.  - acute diverticulitis. Question iliac vein reactive thrombosis, but likely this might be artifactual.  Consider correlation with DVT ultrasound.  Subtle nodularity of the liver contours could reflect cirrhosis.  Aortic atherosclerosis. Patient was given IV antibiotic and admitted for further management  Subjective: Seen and examined today No N/V abd sore still but some better and feels  diverticulitis is getting better Had soft BM thiS am Overnight on room air febrile up to one 101.3,VSS, Labs stable CBC BMP, ua  Assessment and plan:  Acute sigmoid descending colon diverticulitis: Recent admission and discharged on 10/11 for acute  diverticulitis.  Labs with mild leukocytosis, patient with persistent pain.  Continue IV ceftriaxone Flagyl and PPI pain management analgesic antiemetics Needs colonoscopy in 6 weeks  Question iliac vein reactive thrombosis, but likely this might be artifactual: Per CT scan will obtain duplex .  PAF: Rate controlled continue Eliquis , atenolol .  Chronic kidney disease, stage 3a: Renal function stable at baseline.  Monitor  Hypertension: BP stable on atenolol   Hyperlipidemia: Continue statin.  Gout: Continue allopurinol   Impaired fasting glucose Trend cbg  Mobility: PT Orders: Active  PT Follow up Rec:    DVT prophylaxis:  Code Status:   Code Status: Full Code Family Communication: plan of care discussed with patient at bedside. Patient status is: Remains hospitalized because of severity of illness Level of care: Telemetry   Dispo: The patient is from: home alone            Anticipated disposition: TBD Objective: Vitals last 24 hrs: Vitals:   08/06/24 2057 08/07/24 0113 08/07/24 0506 08/07/24 0906  BP: 135/81 (!) 168/74 139/82 (!) 141/77  Pulse: 74 81 76   Resp: 19 18 16 11   Temp: 99.4 F (37.4 C) 97.6 F (36.4 C) 99.1 F (37.3 C)   TempSrc: Oral Oral Oral   SpO2: 94% 97% 94%     Physical Examination: General exam: alert awake, oriented, older than stated age HEENT:Oral mucosa moist, Ear/Nose WNL grossly Respiratory system: Bilaterally clear BS,no use of accessory muscle Cardiovascular system: S1 & S2 +, No JVD. Gastrointestinal system: Abdomen soft,Tender lower abdomen and more on LLQ,ND, BS+ Nervous System: Alert, awake, moving all extremities,and following commands. Extremities: extremities warm, leg edema neg Skin: Warm, no rashes MSK: Normal muscle bulk,tone, power.   Medications reviewed:  Scheduled Meds:  allopurinol   100 mg Oral BID   apixaban   5 mg Oral BID   atenolol   50 mg Oral BID   loratadine   10 mg Oral Daily   rosuvastatin   5 mg Oral  Daily   venlafaxine  XR  37.5 mg Oral Daily   Continuous Infusions:  cefTRIAXone (ROCEPHIN)  IV 2 g (08/07/24 0956)   metronidazole 500 mg (08/06/24 2111)   Diet: Diet Order             DIET SOFT Room service appropriate? Yes; Fluid consistency: Thin  Diet effective now                    Data Reviewed: I have personally reviewed following labs and imaging studies ( see epic result tab) CBC: Recent Labs  Lab 08/06/24 0903 08/07/24 0640  WBC 11.7* 9.9  HGB 15.0 14.2  HCT 49.9* 44.6  MCV 97.3 96.3  PLT 291 268   CMP: Recent Labs  Lab 08/06/24 0903 08/07/24 0640  NA 133* 137  K 4.6 4.1  CL 99 103  CO2 21* 23  GLUCOSE 125* 108*  BUN 14 12  CREATININE 0.70 0.64  CALCIUM  10.0 9.7   GFR: Estimated Creatinine Clearance: 47.2 mL/min (by C-G formula based on SCr of 0.64 mg/dL). Recent Labs  Lab 08/06/24 0903 08/07/24 0640  AST 51* 28  ALT 42 28  ALKPHOS 120 111  BILITOT 0.9 0.7  PROT 7.9 7.0  ALBUMIN  4.3 3.9    Recent Labs  Lab 08/06/24 0903  LIPASE 41   No results for input(s): AMMONIA in the last 168 hours. Coagulation Profile: No results for input(s): INR, PROTIME in the last 168 hours. Unresulted Labs (From admission, onward)     Start     Ordered   08/08/24 0500  Basic metabolic panel with GFR  Daily,   R      08/07/24 0812   08/08/24 0500  CBC  Daily,   R      08/07/24 9187           Antimicrobials/Microbiology: Anti-infectives (From admission, onward)    Start     Dose/Rate Route Frequency Ordered Stop   08/06/24 1245  cefTRIAXone (ROCEPHIN) 2 g in sodium chloride  0.9 % 100 mL IVPB        2 g 200 mL/hr over 30 Minutes Intravenous Daily 08/06/24 1228     08/06/24 1245  metroNIDAZOLE (FLAGYL) IVPB 500 mg        500 mg 100 mL/hr over 60 Minutes Intravenous 2 times daily 08/06/24 1228           Component Value Date/Time   SDES  07/27/2024 2037    URINE, RANDOM Performed at [facility], [address],  [place], [place] [address]    SPECREQUEST  07/27/2024 2037    NONE Reflexed from [id number] Performed at [facility], [address] [place], [place] [address]    CULT (A) 07/27/2024 2037    <10,000 COLONIES/mL INSIGNIFICANT GROWTH Performed at [facility], [address] [place], [place] [address]    REPTSTATUS 07/29/2024 FINAL 07/27/2024 2037    Procedures:    Mennie LAMY, MD [facility] 08/07/2024, 10:14 AM

## 2024-08-07 NOTE — Evaluation (Signed)
 Physical Therapy Evaluation Patient Details Name: Kathryn Ayala MRN: [id number] DOB: October 29, 1934 Today's Date: 08/07/2024  History of Present Illness  Pt is 89 yo female admitted on 08/06/24 with acute sigmoid descending colon diverticulitis.  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 At baseline, pt lives alone but has family nearby if needed.  She is typically ambulatory with cane but using RW lately due to weakness.  Pt was supposed to get HHPT after last d/c but reports she did not get contacted by them.  Today, pt able to ambulate 30' in room with CGA.  She fatigued easily but was steady with RW.  Pt expected to progress well in order to return home with HHPT. Pt currently with functional limitations due to the deficits listed below (see PT Problem List). Pt will benefit from acute skilled PT to increase their independence and safety with mobility to allow discharge.           If plan is discharge home, recommend the following: A little help with walking and/or transfers;A little help with bathing/dressing/bathroom;Assistance with cooking/housework   Can travel by private vehicle        Equipment Recommendations None recommended by PT  Recommendations for Other Services       Functional Status Assessment Patient has had a recent decline in their functional status and demonstrates the ability to make significant improvements in function in a reasonable and predictable amount of time.     Precautions / Restrictions Precautions Precautions: Fall      Mobility  Bed Mobility Overal bed mobility: Needs Assistance Bed Mobility: Supine to Sit     Supine to sit: HOB elevated, Used rails, Supervision     General bed  mobility comments: increased time but no assist    Transfers Overall transfer level: Needs assistance Equipment used: Rolling walker (2 wheels) Transfers: Sit to/from Stand Sit to Stand: Min assist           General transfer comment: cues for hand placement    Ambulation/Gait Ambulation/Gait assistance: Contact guard assist Gait Distance (Feet): 30 Feet Assistive device: Rolling walker (2 wheels) Gait Pattern/deviations: Step-to pattern, Decreased stride length Gait velocity: decreased     General Gait Details: Fatigued easily; min cues for [employer]            Wheelchair Mobility     Tilt Bed    Modified Rankin (Stroke Patients Only)       Balance Overall balance assessment: Needs assistance Sitting-balance support: No upper extremity supported Sitting balance-Leahy Scale: Good     Standing balance support: Reliant on assistive device for balance, Bilateral upper extremity supported, During functional activity Standing balance-Leahy Scale: Poor Standing balance comment: reliant on RW for support                             Pertinent Vitals/Pain Pain Assessment Pain Assessment: No/denies pain    Home Living Family/patient expects to be discharged to:: Private residence Living Arrangements: Alone Available Help at Discharge: Family;Available PRN/intermittently Type of Home: House Home Access: Stairs to enter Entrance Stairs-Rails: Left Entrance Stairs-Number of Steps: 2   Home Layout: One level Home Equipment: Grab bars - tub/shower;Grab bars - toilet;Rolling Walker (2 wheels);Shower seat;Hand held shower head;BSC/3in1;Cane - single point  Prior Function Prior Level of Function : Independent/Modified Independent;Driving             Mobility Comments: Prior to recent admissions ambulating in community with [facility], recently using RW.  Denies falls ADLs Comments: IND with adls and light iadls     Extremity/Trunk Assessment    Upper Extremity Assessment Upper Extremity Assessment: Defer to OT evaluation    Lower Extremity Assessment Lower Extremity Assessment: RLE deficits/detail;LLE deficits/detail (.) RLE Deficits / Details: ROM WFL; MMT 4/5 LLE Deficits / Details: ROM WFL; MMT 4/5    Cervical / Trunk Assessment Cervical / Trunk Assessment: Normal  Communication        Cognition Arousal: Alert Behavior During Therapy: WFL for tasks assessed/performed   PT - Cognitive impairments: No apparent impairments                                 Cueing       General Comments General comments (skin integrity, edema, etc.): VSS    Exercises     Assessment/Plan    PT Assessment Patient needs continued PT services  PT Problem List Decreased activity tolerance;Decreased balance;Decreased mobility;Decreased strength;Decreased knowledge of use of DME       PT Treatment Interventions DME instruction;Gait training;Stair training;Functional mobility training;Therapeutic activities;Therapeutic exercise;Balance training;Patient/family education    PT Goals (Current goals can be found in the Care Plan section)  Acute Rehab PT Goals Patient Stated Goal: To get stronger PT Goal Formulation: With patient Time For Goal Achievement: 08/21/24 Potential to Achieve Goals: Good    Frequency Min 2X/week     Co-evaluation               AM-PAC PT 6 Clicks Mobility  Outcome Measure Help needed turning from your back to your side while in a flat bed without using bedrails?: A Little Help needed moving from lying on your back to sitting on the side of a flat bed without using bedrails?: A Little Help needed moving to and from a bed to a chair (including a wheelchair)?: A Little Help needed standing up from a chair using your arms (e.g., wheelchair or bedside chair)?: A Little Help needed to walk in hospital room?: A Little Help needed climbing 3-5 steps with a railing? : A Little 6 Click  Score: 18    End of Session Equipment Utilized During Treatment: Gait belt Activity Tolerance: Patient limited by fatigue Patient left: in chair;with call bell/phone within reach Nurse Communication: Mobility status PT Visit Diagnosis: Other abnormalities of gait and mobility (R26.89);Muscle weakness (generalized) (M62.81)    Time: 8654-8595 PT Time Calculation (min) (ACUTE ONLY): 19 min   Charges:   PT Evaluation $PT Eval Low Complexity: 1 Low   PT General Charges $$ ACUTE PT VISIT: 1 Visit         Benjiman, PT Acute Rehab [facility] [phone]   Benjiman VEAR [place] 08/07/2024, 2:14 PM

## 2024-08-07 NOTE — Hospital Course (Addendum)
 Kathryn Ayala is a 89 y.o. 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 Labs UA ok,Only 6-10 WBCs. Wbc-11.7, hb 15.0 g/dL platelets 708.  Lipase is normal.  CMP showed a glucose of 125 mg/dL, AST of 51 units/L, sodium 133 and CO2 of 21 mmol/L with a normal anion gap.  The rest of the electrolytes, the rest of the LFTs and renal function were normal. CT abdomen/pelvis with contrast>> again showing circumferential wall thickening with adjacent fat stranding along the confluence of the sigmoid colon with the descending colon with asymmetric pericolonic enhancement in this area reflecting a degree of peritonitis.  Small volume free fluid in the pelvis.  No definite free air or focal drainable fluid collection.  - acute diverticulitis. Question iliac vein reactive thrombosis, but likely this might be artifactual.  Consider correlation with DVT ultrasound.  Subtle nodularity of the liver contours could reflect cirrhosis.  Aortic atherosclerosis. Patient was given IV antibiotic and admitted for further management.  Subjective: Seen and examined Overnight no acute events afebrile BP stable Tolerating diet, abdominal pain stable She feels somewhat uncomfortable to go home today, her granddaughter is sick and nobody will be able to come to her house tonight She feels she left too early on last admission and had to come back  Assessment and plan:  Acute sigmoid descending colon diverticulitis History of diverticulitis: Recent admission and discharged on 10/11 for acute diverticulitis, she feels  she was discharged too early. Initial leukocytosis resolved, patient is symptomatically improving,, tolerating soft diet overall feeling better Patient admitted and managing with IV ceftriaxone Flagyl and PPI pain management analgesic antiemetics No significant tenderness, WBC normal. C diff  NEG, GI pathogen panel negative, added probiotics Needs colonoscopy in 6 weeks and is aware. She wants to see how she does overnight and if does okay agreeable for discharge home tomorrow  Question iliac vein reactive thrombosis, but likely this might be artifactual: Duplex obtained 10/19 no DVT in lower extremity  PAF: Rate controlled cont home Eliquis , atenolol .  Chronic kidney disease, stage 3a: Renal function stable at baseline.  Monitor, ensure adequate hydration  Hypertension: BP stable, continue atenolol   Hyperlipidemia: Continue statin.  Gout: Continue allopurinol   Impaired fasting glucose Trend cbg  Mobility: PT Orders: Active  PT Follow up Rec: Home Health Pt10/19/2025 1413   DVT prophylaxis:  Code Status:   Code Status: Full Code Family Communication: plan of care discussed with patient at bedside. Patient status is: Remains hospitalized because of severity of illness Level of care: Telemetry   Dispo: The patient is from: home alone            Anticipated disposition: Anticipating discharge home tomorrow   Objective: Vitals last 24 hrs: Vitals:   08/08/24 1058 08/08/24 1338 08/08/24 2131 08/09/24 0434  BP: 136/76 (!) 141/78 (!) 185/81 (!) 163/87  Pulse: 76 75 80 71  Resp:  18 20 18   Temp:  98.1 F (36.7 C) 98.8 F (37.1 C) 98.5 F (36.9 C)  TempSrc:   Oral   SpO2:  98% 99% 95%    Physical Examination: General exam: alert awake, oriented HEENT:Oral mucosa moist, Ear/Nose WNL grossly Respiratory system: CTA b/l  Cardiovascular  system: S1 & S2 +, No JVD. Gastrointestinal system: Abdomen soft  some tenderness on mid abdomen Nervous System: Alert, awake,  moving all extremities,and following commands. Extremities: extremities warm, leg edema neg Skin: Warm, no rashes MSK: Normal muscle bulk,tone, power.   Medications reviewed:  Scheduled Meds:  allopurinol   100 mg Oral BID   apixaban   5 mg Oral BID   atenolol   50 mg Oral BID   loratadine   10 mg Oral Daily   rosuvastatin   5 mg Oral Daily   saccharomyces boulardii  250 mg Oral BID   venlafaxine  XR  37.5 mg Oral Daily   Continuous Infusions:  cefTRIAXone (ROCEPHIN)  IV 2 g (08/09/24 1025)   metronidazole 500 mg (08/09/24 1022)   Diet: Diet Order             DIET SOFT Room service appropriate? Yes; Fluid consistency: Thin  Diet effective now

## 2024-08-08 DIAGNOSIS — K5792 Diverticulitis of intestine, part unspecified, without perforation or abscess without bleeding: Secondary | ICD-10-CM | POA: Diagnosis not present

## 2024-08-08 LAB — BASIC METABOLIC PANEL WITH GFR
Anion gap: 13 (ref 5–15)
BUN: 16 mg/dL (ref 8–23)
CO2: 20 mmol/L — ABNORMAL LOW (ref 22–32)
Calcium: 9.6 mg/dL (ref 8.9–10.3)
Chloride: 104 mmol/L (ref 98–111)
Creatinine, Ser: 0.65 mg/dL (ref 0.44–1.00)
GFR, Estimated: >60 mL/min (ref >60)
Glucose, Bld: 95 mg/dL (ref 70–99)
Potassium: 4.1 mmol/L (ref 3.5–5.1)
Sodium: 137 mmol/L (ref 135–145)

## 2024-08-08 LAB — CBC
HCT: 44.9 % (ref 36.0–46.0)
Hemoglobin: 13.7 g/dL (ref 12.0–15.0)
MCH: 29.7 pg (ref 26.0–34.0)
MCHC: 30.5 g/dL (ref 30.0–36.0)
MCV: 97.2 fL (ref 80.0–100.0)
Platelets: 269 K/uL (ref 150–400)
RBC: 4.62 MIL/uL (ref 3.87–5.11)
RDW: 14.5 % (ref 11.5–15.5)
WBC: 6.6 K/uL (ref 4.0–10.5)
nRBC: 0 % (ref 0.0–0.2)

## 2024-08-08 LAB — C DIFFICILE QUICK SCREEN W PCR REFLEX
C Diff antigen: NEGATIVE
C Diff interpretation: NOT DETECTED
C Diff toxin: NEGATIVE

## 2024-08-08 MED ORDER — SACCHAROMYCES BOULARDII 250 MG PO CAPS
250.0000 mg | ORAL_CAPSULE | Freq: Two times a day (BID) | ORAL | Status: DC
  Administered 2024-08-08 – 2024-08-11 (×7): 250 mg via ORAL
  Filled 2024-08-08 (×7): qty 1

## 2024-08-08 NOTE — Plan of Care (Signed)
  Problem: Education: Goal: Knowledge of General Education information will improve Description: Including pain rating scale, medication(s)/side effects and non-pharmacologic comfort measures Outcome: Progressing   Problem: Health Behavior/Discharge Planning: Goal: Ability to manage health-related needs will improve Outcome: Progressing   Problem: Clinical Measurements: Goal: Ability to maintain clinical measurements within normal limits will improve Outcome: Progressing Goal: Will remain free from infection Outcome: Progressing Goal: Diagnostic test results will improve Outcome: Progressing   Problem: Activity: Goal: Risk for activity intolerance will decrease Outcome: Progressing   Problem: Coping: Goal: Level of anxiety will decrease Outcome: Progressing   Problem: Elimination: Goal: Will not experience complications related to bowel motility Outcome: Progressing   Problem: Pain Managment: Goal: General experience of comfort will improve and/or be controlled Outcome: Progressing   Problem: Safety: Goal: Ability to remain free from injury will improve Outcome: Progressing   Problem: Skin Integrity: Goal: Risk for impaired skin integrity will decrease Outcome: Progressing   Problem: Education: Goal: Knowledge of General Education information will improve Description: Including pain rating scale, medication(s)/side effects and non-pharmacologic comfort measures Outcome: Progressing   Problem: Health Behavior/Discharge Planning: Goal: Ability to manage health-related needs will improve Outcome: Progressing   Problem: Clinical Measurements: Goal: Ability to maintain clinical measurements within normal limits will improve Outcome: Progressing Goal: Will remain free from infection Outcome: Progressing Goal: Diagnostic test results will improve Outcome: Progressing   Problem: Activity: Goal: Risk for activity intolerance will decrease Outcome: Progressing    Problem: Coping: Goal: Level of anxiety will decrease Outcome: Progressing   Problem: Elimination: Goal: Will not experience complications related to bowel motility Outcome: Progressing   Problem: Pain Managment: Goal: General experience of comfort will improve and/or be controlled Outcome: Progressing   Problem: Safety: Goal: Ability to remain free from injury will improve Outcome: Progressing   Problem: Skin Integrity: Goal: Risk for impaired skin integrity will decrease Outcome: Progressing

## 2024-08-08 NOTE — Progress Notes (Signed)
 PROGRESS NOTE Kathryn Ayala  FMW:[id number] DOB: 09/10/1935 DOA: 08/06/2024 PCP: Shayne Anes, MD  Brief Narrative/Hospital Course: Kathryn Ayala is a 89 y.o. 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  Labs UA ok,Only 6-10 WBCs. Wbc-11.7, hb 15.0 g/dL platelets 708.  Lipase is normal.  CMP showed a glucose of 125 mg/dL, AST of 51 units/L, sodium 133 and CO2 of 21 mmol/L with a normal anion gap.  The rest of the electrolytes, the rest of the LFTs and renal function were normal.  CT abdomen/pelvis with contrast>> again showing circumferential wall thickening with adjacent fat stranding along the confluence of the sigmoid colon with the descending colon with asymmetric pericolonic enhancement in this area reflecting a degree of peritonitis.  Small volume free fluid in the pelvis.  No definite free air or focal drainable fluid collection.  - acute diverticulitis. Question iliac vein reactive thrombosis, but likely this might be artifactual.  Consider correlation with DVT ultrasound.  Subtle nodularity of the liver contours could reflect cirrhosis.  Aortic atherosclerosis. Patient was given IV antibiotic and admitted for further management  Subjective: Seen and examined Overnight afebrile BP 130s-150s, on room air Labs reviewed CBC BMP remains normal stable Complains of multiple loose bowel movement every hour and it has been going prior to admission. is on the bedside chair.  Assessment and plan:  Acute sigmoid descending colon diverticulitis History of: Recent admission and  discharged on 10/11 for acute diverticulitis, she feels she was discharged too early. Initial leukocytosis resolved, patient is symptomatically improving,, tolerating soft diet overall feeling better Continue IV ceftriaxone Flagyl and PPI pain management analgesic antiemetics. Check C diff and and GI pathogen panel add probiotics ongoing diarrhea present PTA Needs colonoscopy in 6 weeks and is aware.  Question iliac vein reactive thrombosis, but likely this might be artifactual: Duplex obtained 10/19 no DVT in lower extremity  PAF: Rate controlled cont home Eliquis , atenolol .  Chronic kidney disease, stage 3a: Renal function stable at baseline.  Monitor, ensure adequate hydration  Hypertension: BP stable, continue atenolol   Hyperlipidemia: Continue statin.  Gout: Continue allopurinol   Impaired fasting glucose Trend cbg  Mobility: PT Orders: Active  PT Follow up Rec: Home Health Pt10/19/2025 1413   DVT prophylaxis:  Code Status:   Code Status: Full Code Family Communication: plan of care discussed with patient at bedside. Patient status is: Remains hospitalized because of severity of illness Level of care: Telemetry   Dispo: The patient is from: home alone            Anticipated disposition: TBD Objective: Vitals last 24 hrs: Vitals:   08/07/24 1545 08/07/24 2108 08/08/24 0500 08/08/24 1058  BP: (!) 130/90 (!) 159/69 (!) 151/72 136/76  Pulse: 70 80 72 76  Resp: 20 18 18    Temp: 98 F (36.7 C) 98.6 F (37 C) 98.4 F (36.9 C)   TempSrc: Oral Oral Oral   SpO2: 95% 96% 97%     Physical Examination: General exam: alert awake, oriented HEENT:Oral mucosa moist, Ear/Nose WNL grossly Respiratory system: CTA b/l  Cardiovascular system: S1 & S2 +, No JVD. Gastrointestinal system: Abdomen soft mildly tender on lower abdomen,, BS+ Nervous System: Alert, awake,  moving all extremities,and following commands. Extremities: extremities warm, leg edema neg Skin: Warm, no  rashes MSK: Normal muscle bulk,tone, power.   Medications reviewed:  Scheduled Meds:  allopurinol   100 mg Oral BID   apixaban   5 mg Oral BID   atenolol   50 mg Oral BID   loratadine   10 mg Oral Daily   rosuvastatin   5 mg Oral Daily   saccharomyces boulardii  250 mg Oral BID   venlafaxine  XR  37.5 mg Oral Daily   Continuous Infusions:  sodium chloride  Stopped (08/07/24 2252)   cefTRIAXone (ROCEPHIN)  IV 2 g (08/08/24 1111)   metronidazole 500 mg (08/08/24 1054)   Diet: Diet Order             DIET SOFT Room service appropriate? Yes; Fluid consistency: Thin  Diet effective now                    Data Reviewed: I have personally reviewed following labs and imaging studies ( see epic result tab) CBC: Recent Labs  Lab 08/06/24 0903 08/07/24 0640 08/08/24 0515  WBC 11.7* 9.9 6.6  HGB 15.0 14.2 13.7  HCT 49.9* 44.6 44.9  MCV 97.3 96.3 97.2  PLT 291 268 269   CMP: Recent Labs  Lab 08/06/24 0903 08/07/24 0640 08/08/24 0515  NA 133* 137 137  K 4.6 4.1 4.1  CL 99 103 104  CO2 21* 23 20*  GLUCOSE 125* 108* 95  BUN 14 12 16   CREATININE 0.70 0.64 0.65  CALCIUM  10.0 9.7 9.6   GFR: Estimated Creatinine Clearance: 47.2 mL/min (by C-G formula based on SCr of 0.65 mg/dL). Recent Labs  Lab 08/06/24 0903 08/07/24 0640  AST 51* 28  ALT 42 28  ALKPHOS 120 111  BILITOT 0.9 0.7  PROT 7.9 7.0  ALBUMIN  4.3 3.9    Recent Labs  Lab 08/06/24 0903  LIPASE 41   No results for input(s): AMMONIA in the last 168 hours. Coagulation Profile: No results for input(s): INR, PROTIME in the last 168 hours. Unresulted Labs (From admission, onward)     Start     Ordered   08/08/24 1015  C Difficile Quick Screen w PCR reflex  (C Difficile quick screen w PCR reflex panel )  Once, for 24 hours,   TIMED       References:    CDiff Information Tool   08/08/24 1014   08/08/24 1015  Gastrointestinal Panel by PCR , Stool  (Gastrointestinal Panel by PCR, Stool                                                                                                                                                      **Does Not include CLOSTRIDIUM DIFFICILE testing. **If CDIFF testing is needed, place order from the C Difficile Testing order set.**)  Once,   R        08/08/24 1014   08/08/24 0500  Basic metabolic panel with GFR  Daily,   R      08/07/24 0812   08/08/24 0500  CBC  Daily,   R      08/07/24 9187           Antimicrobials/Microbiology: Anti-infectives (From admission, onward)    Start     Dose/Rate Route Frequency Ordered Stop   08/06/24 1245  cefTRIAXone (ROCEPHIN) 2 g in sodium chloride  0.9 % 100 mL IVPB        2 g 200 mL/hr over 30 Minutes Intravenous Daily 08/06/24 1228     08/06/24 1245  metroNIDAZOLE (FLAGYL) IVPB 500 mg        500 mg 100 mL/hr over 60 Minutes Intravenous 2 times daily 08/06/24 1228           Component Value Date/Time   SDES  07/27/2024 2037    URINE, RANDOM Performed at [facility], [address], [place], [place] [address]    SPECREQUEST  07/27/2024 2037    NONE Reflexed from [id number] Performed at [facility], [address] [place], [place] [address]    CULT (A) 07/27/2024 2037    <10,000 COLONIES/mL INSIGNIFICANT GROWTH Performed at [facility], [address] [place], [place] [address]    REPTSTATUS 07/29/2024 FINAL 07/27/2024 2037    Procedures:    Mennie LAMY, MD [facility] 08/08/2024, 11:17 AM

## 2024-08-09 DIAGNOSIS — K5792 Diverticulitis of intestine, part unspecified, without perforation or abscess without bleeding: Secondary | ICD-10-CM | POA: Diagnosis not present

## 2024-08-09 LAB — GASTROINTESTINAL PANEL BY PCR, STOOL (REPLACES STOOL CULTURE)

## 2024-08-09 LAB — BASIC METABOLIC PANEL WITH GFR
Anion gap: 14 (ref 5–15)
BUN: 15 mg/dL (ref 8–23)
CO2: 19 mmol/L — ABNORMAL LOW (ref 22–32)
Calcium: 9.6 mg/dL (ref 8.9–10.3)
Chloride: 105 mmol/L (ref 98–111)
Creatinine, Ser: 0.74 mg/dL (ref 0.44–1.00)
GFR, Estimated: >60 mL/min (ref >60)
Glucose, Bld: 156 mg/dL — ABNORMAL HIGH (ref 70–99)
Potassium: 3.6 mmol/L (ref 3.5–5.1)
Sodium: 137 mmol/L (ref 135–145)

## 2024-08-09 LAB — CBC
HCT: 43.3 % (ref 36.0–46.0)
Hemoglobin: 13.2 g/dL (ref 12.0–15.0)
MCH: 30.1 pg (ref 26.0–34.0)
MCHC: 30.5 g/dL (ref 30.0–36.0)
MCV: 98.6 fL (ref 80.0–100.0)
Platelets: 291 K/uL (ref 150–400)
RBC: 4.39 MIL/uL (ref 3.87–5.11)
RDW: 14.4 % (ref 11.5–15.5)
WBC: 5.5 K/uL (ref 4.0–10.5)
nRBC: 0 % (ref 0.0–0.2)

## 2024-08-09 MED ORDER — ORAL CARE MOUTH RINSE: 15.0000 mL | OROMUCOSAL | Status: DC | PRN

## 2024-08-09 MED ORDER — LACTATED RINGERS IV SOLN: INTRAVENOUS | Status: AC

## 2024-08-09 MED ORDER — PROMETHAZINE (PHENERGAN) 6.25MG IN NS 50ML IVPB
6.2500 mg | Freq: Four times a day (QID) | INTRAVENOUS | Status: DC | PRN
  Administered 2024-08-09: 6.25 mg via INTRAVENOUS
  Filled 2024-08-09: qty 6.25

## 2024-08-09 NOTE — Progress Notes (Signed)
 PROGRESS NOTE Kathryn Ayala  FMW:[id number] DOB: [birth year] DOA: 08/06/2024 PCP: Shayne Anes, MD  Brief Narrative/Hospital Course: Kathryn Ayala is a 89 y.o. 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 Labs UA ok,Only 6-10 WBCs. Wbc-11.7, hb 15.0 g/dL platelets 708.  Lipase is normal.  CMP showed a glucose of 125 mg/dL, AST of 51 units/L, sodium 133 and CO2 of 21 mmol/L with a normal anion gap.  The rest of the electrolytes, the rest of the LFTs and renal function were normal. CT abdomen/pelvis with contrast>> again showing circumferential wall thickening with adjacent fat stranding along the confluence of the sigmoid colon with the descending colon with asymmetric pericolonic enhancement in this area reflecting a degree of peritonitis.  Small volume free fluid in the pelvis.  No definite free air or focal drainable fluid collection.  - acute diverticulitis. Question iliac vein reactive thrombosis, but likely this might be artifactual.  Consider correlation with DVT ultrasound.  Subtle nodularity of the liver contours could reflect cirrhosis.  Aortic atherosclerosis. Patient was given IV antibiotic and admitted for further management.  Subjective: Seen and examined Overnight no acute events afebrile BP stable Tolerating diet, abdominal pain stable She feels somewhat uncomfortable to go home today, her granddaughter is sick and nobody will be able to come to her house tonight She feels she left too early on last admission and had to come back  Assessment and plan:  Acute  sigmoid descending colon diverticulitis History of diverticulitis: Recent admission and discharged on 10/11 for acute diverticulitis, she feels she was discharged too early. Initial leukocytosis resolved, patient is symptomatically improving,, tolerating soft diet overall feeling better Patient admitted and managing with IV ceftriaxone Flagyl and PPI pain management analgesic antiemetics No significant tenderness, WBC normal. C diff  NEG, GI pathogen panel negative, added probiotics Needs colonoscopy in 6 weeks and is aware. She wants to see how she does overnight and if does okay agreeable for discharge home tomorrow  Question iliac vein reactive thrombosis, but likely this might be artifactual: Duplex obtained 10/19 no DVT in lower extremity  PAF: Rate controlled cont home Eliquis , atenolol .  Chronic kidney disease, stage 3a: Renal function stable at baseline.  Monitor, ensure adequate hydration  Hypertension: BP stable, continue atenolol   Hyperlipidemia: Continue statin.  Gout: Continue allopurinol   Impaired fasting glucose Trend cbg  Mobility: PT Orders: Active  PT Follow up Rec: Home Health Pt10/19/2025 1413   DVT prophylaxis:  Code Status:   Code Status: Full Code Family Communication: plan of care discussed with patient at bedside. Patient status is: Remains hospitalized because of severity of illness Level of care: Telemetry   Dispo: The patient is from: home alone            Anticipated disposition: Anticipating discharge home tomorrow   Objective: Vitals last 24 hrs: Vitals:   08/08/24 1058 08/08/24 1338 08/08/24 2131 08/09/24 0434  BP: 136/76 (!) 141/78 (!) 185/81 (!) 163/87  Pulse: 76 75 80 71  Resp:  18 20 18   Temp:  98.1 F (36.7 C) 98.8 F (37.1 C) 98.5 F (36.9 C)  TempSrc:   Oral   SpO2:  98% 99% 95%  Physical Examination: General exam: alert awake, oriented HEENT:Oral mucosa moist, Ear/Nose WNL grossly Respiratory system: CTA b/l   Cardiovascular system: S1 & S2 +, No JVD. Gastrointestinal system: Abdomen soft  some tenderness on mid abdomen Nervous System: Alert, awake, moving all extremities,and following commands. Extremities: extremities warm, leg edema neg Skin: Warm, no rashes MSK: Normal muscle bulk,tone, power.   Medications reviewed:  Scheduled Meds:  allopurinol   100 mg Oral BID   apixaban   5 mg Oral BID   atenolol   50 mg Oral BID   loratadine   10 mg Oral Daily   rosuvastatin   5 mg Oral Daily   saccharomyces boulardii  250 mg Oral BID   venlafaxine  XR  37.5 mg Oral Daily   Continuous Infusions:  cefTRIAXone (ROCEPHIN)  IV 2 g (08/09/24 1025)   metronidazole 500 mg (08/09/24 1022)   Diet: Diet Order             DIET SOFT Room service appropriate? Yes; Fluid consistency: Thin  Diet effective now                    Data Reviewed: I have personally reviewed following labs and imaging studies ( see epic result tab) CBC: Recent Labs  Lab 08/06/24 0903 08/07/24 0640 08/08/24 0515 08/09/24 0552  WBC 11.7* 9.9 6.6 5.5  HGB 15.0 14.2 13.7 13.2  HCT 49.9* 44.6 44.9 43.3  MCV 97.3 96.3 97.2 98.6  PLT 291 268 269 291   CMP: Recent Labs  Lab 08/06/24 0903 08/07/24 0640 08/08/24 0515 08/09/24 0552  NA 133* 137 137 137  K 4.6 4.1 4.1 3.6  CL 99 103 104 105  CO2 21* 23 20* 19*  GLUCOSE 125* 108* 95 156*  BUN 14 12 16 15   CREATININE 0.70 0.64 0.65 0.74  CALCIUM  10.0 9.7 9.6 9.6   GFR: Estimated Creatinine Clearance: 47.2 mL/min (by C-G formula based on SCr of 0.74 mg/dL). Recent Labs  Lab 08/06/24 0903 08/07/24 0640  AST 51* 28  ALT 42 28  ALKPHOS 120 111  BILITOT 0.9 0.7  PROT 7.9 7.0  ALBUMIN  4.3 3.9    Recent Labs  Lab 08/06/24 0903  LIPASE 41   No results for input(s): AMMONIA in the last 168 hours. Coagulation Profile: No results for input(s): INR, PROTIME in the last 168 hours. Unresulted Labs (From admission, onward)     Start     Ordered   08/08/24 0500   Basic metabolic panel with GFR  Daily,   R      08/07/24 0812   08/08/24 0500  CBC  Daily,   R      08/07/24 9187           Antimicrobials/Microbiology: Anti-infectives (From admission, onward)    Start     Dose/Rate Route Frequency Ordered Stop   08/06/24 1245  cefTRIAXone (ROCEPHIN) 2 g in sodium chloride  0.9 % 100 mL IVPB        2 g 200 mL/hr over 30 Minutes Intravenous Daily 08/06/24 1228     08/06/24 1245  metroNIDAZOLE (FLAGYL) IVPB 500 mg        500 mg 100 mL/hr over 60 Minutes Intravenous 2 times daily 08/06/24 1228           Component Value Date/Time   SDES  07/27/2024 2037    URINE, RANDOM Performed at [facility], [address], [place], [place] [address]    [facility]  07/27/2024 2037  NONE Reflexed from [id number] Performed at [facility], [address], [place], [place] [address]    CULT (A) 07/27/2024 2037    <10,000 COLONIES/mL INSIGNIFICANT GROWTH Performed at [facility], [address], [place], [place] [address]    REPTSTATUS 07/29/2024 FINAL 07/27/2024 2037   Procedures:   Mennie LAMY, MD [facility] 08/09/2024, 11:42 AM

## 2024-08-09 NOTE — Progress Notes (Signed)
 Physical Therapy Treatment Patient Details Name: Kathryn Ayala MRN: [id number] DOB: 05/19/1935 Today's Date: 08/09/2024   History of Present Illness Pt is 89 yo female admitted on 08/06/24 with acute sigmoid descending colon diverticulitis.  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  Pt currently with functional limitations due to the deficits listed below (see PT Problem List). PT arrived and noted call bell illuminated pt reported wanting to sit EOB for meal. PT encouraged pt to get up and OOB to eat her meal. Pt agreeable. Pt required increased time S and use of hospital bed with min cues for supine to sit, set up and S for safety with transfer bed to recliner. PT set up up with meal and all needs in place and pt reported that she has not really gotten and PT since she has been here, PT inquired if pt would like to ambulate at this time with pt requesting that PT return. PT provided pt ed on the likelihood of PT being able to return very limited and strongly encouraged pt to take advantage of gait training. Pt declined and elected to eat her meal. Pt left in recliner, all needs in place. Pt will benefit from acute skilled PT to increase their independence and safety with mobility to allow discharge.      If plan is discharge home, recommend the following: A little help with walking and/or transfers;A little help with bathing/dressing/bathroom;Assistance with cooking/housework   Can travel by private vehicle        Equipment Recommendations  None recommended by PT    Recommendations for Other Services       Precautions / Restrictions Precautions Precautions: Fall Recall of Precautions/Restrictions: Intact Precaution/Restrictions  Comments: walks away from [employer] Restrictions Weight Bearing Restrictions Per Provider Order: No     Mobility  Bed Mobility Overal bed mobility: Needs Assistance Bed Mobility: Supine to Sit     Supine to sit: Supervision, HOB elevated, Used rails     General bed mobility comments: min cues increased time and use of hospital bed, cues to scoot to EOB and place B LE on floor in perperation for transfer task    Transfers Overall transfer level: Needs assistance Equipment used: Rolling walker (2 wheels) Transfers: Sit to/from Stand, Bed to chair/wheelchair/BSC Sit to Stand: Supervision Stand pivot transfers: Supervision         General transfer comment: supervision for safety with RW and cues    Ambulation/Gait               General Gait Details: pt seated in recliner and reported that she has not really had any PT and wanted to walk, PT encouraged pt to ambulate at that time and pt declined due to PT just having set pt up for meal per pt request.   Stairs             Wheelchair Mobility     Tilt Bed    Modified Rankin (Stroke Patients Only)       Balance Overall balance assessment: Needs assistance Sitting-balance support: No upper extremity supported, Feet supported Sitting balance-Leahy Scale: Good     Standing balance support: Bilateral upper extremity supported, During functional activity Standing balance-Leahy Scale: Fair Standing balance comment: able to stand with light support of RW  Communication Communication Communication: No apparent difficulties  Cognition Arousal: Alert Behavior During Therapy: WFL for tasks assessed/performed   PT - Cognitive impairments: No apparent impairments                         Following commands: Intact      Cueing Cueing Techniques: Verbal cues  Exercises      General Comments        Pertinent Vitals/Pain Pain Assessment Pain Assessment:  Faces Faces Pain Scale: Hurts a little bit Pain Location: lower abdomen Pain Descriptors / Indicators: Discomfort, Grimacing, Guarding Pain Intervention(s): Monitored during session    Home Living                          Prior Function            PT Goals (current goals can now be found in the care plan section) Acute Rehab PT Goals Patient Stated Goal: To get stronger PT Goal Formulation: With patient Time For Goal Achievement: 08/21/24 Potential to Achieve Goals: Good Progress towards PT goals: Progressing toward goals (slowly)    Frequency    Min 2X/week      PT Plan      Co-evaluation              AM-PAC PT 6 Clicks Mobility   Outcome Measure  Help needed turning from your back to your side while in a flat bed without using bedrails?: A Little Help needed moving from lying on your back to sitting on the side of a flat bed without using bedrails?: A Little Help needed moving to and from a bed to a chair (including a wheelchair)?: A Little Help needed standing up from a chair using your arms (e.g., wheelchair or bedside chair)?: A Little Help needed to walk in hospital room?: A Little Help needed climbing 3-5 steps with a railing? : A Little 6 Click Score: 18    End of Session Equipment Utilized During Treatment: Gait belt Activity Tolerance: Patient limited by fatigue Patient left: in chair;with call bell/phone within reach Nurse Communication: Mobility status PT Visit Diagnosis: Other abnormalities of gait and mobility (R26.89);Muscle weakness (generalized) (M62.81)     Time: 8860-8841 PT Time Calculation (min) (ACUTE ONLY): 19 min  Charges:    $Therapeutic Activity: 8-22 mins PT General Charges $$ ACUTE PT VISIT: 1 Visit                     [place], PT Acute Rehab    [place] VEAR Drone 08/09/2024, 3:47 PM

## 2024-08-09 NOTE — Plan of Care (Signed)

## 2024-08-09 NOTE — TOC Initial Note (Signed)
 Transition of Care [facility]) - Initial/Assessment Note    Patient Details  Name: Kathryn Ayala MRN: [id number] Date of Birth: December 04, 1934  [facility] CM/SW Contact:    Heather DELENA Saltness, LCSW Phone Number: 08/09/2024, 2:27 PM  Clinical Narrative:                 CSW met with pt to discuss discharge planning. Pt lives alone, but has family checking in on her frequently throughout the day. CSW advised pt of PT's recommendation for [facility] PT/OT services upon discharge. Pt agreeable to [facility] PT/OT services. Pt denies HH agency preference, but reports her late husband previously used [place]. CSW sent referral to Cindie at [facility]. Cindie confirmed ability to accept pt for services. Will need HH PT/OT orders. Pt reports family will transport her home upon discharge. TOC will continue to follow.    Expected Discharge Plan: Home w Home Health Services Barriers to Discharge: Continued Medical Work up   Patient Goals and CMS Choice Patient states their goals for this hospitalization and ongoing recovery are:: To return home CMS Medicare.gov Compare Post Acute Care list provided to:: Patient Choice offered to / list presented to : Patient [place] ownership interest in [facility].provided to:: Patient    Expected Discharge Plan and Services In-house Referral: Clinical Social Work Discharge Planning Services: NA Post Acute Care Choice: Home Health Living arrangements for the past 2 months: Single Family Home                 DME Arranged: N/A DME Agency: NA Date DME Agency Contacted: 08/09/24 Time DME Agency Contacted: 1427 Representative spoke with at DME Agency: Cindie HH Arranged: PT, OT HH Agency: [facility] Date [facility] Agency Contacted: 08/09/24 Time HH Agency Contacted: 1425 Representative spoke with at [facility] Agency: [facility]  Prior Living Arrangements/Services Living arrangements for the past 2 months: Single Family Home Lives with:: Self, Adult  Children Patient language and need for interpreter reviewed:: Yes Do you feel safe going back to the place where you live?: Yes      Need for Family Participation in Patient Care: Yes (Comment) Care giver support system in place?: Yes (comment)   Criminal Activity/Legal Involvement Pertinent to Current Situation/Hospitalization: No - Comment as needed  Activities of Daily Living   ADL Screening (condition at time of admission) Independently performs ADLs?: No Does the patient have a NEW difficulty with bathing/dressing/toileting/self-feeding that is expected to last >3 days?: Yes (Initiates electronic notice to provider for possible OT consult) Does the patient have a NEW difficulty with getting in/out of bed, walking, or climbing stairs that is expected to last >3 days?: Yes (Initiates electronic notice to provider for possible PT consult) Does the patient have a NEW difficulty with communication that is expected to last >3 days?: No Is the patient deaf or have difficulty hearing?: Yes Does the patient have difficulty seeing, even when wearing glasses/contacts?: No Does the patient have difficulty concentrating, remembering, or making decisions?: No  Permission Sought/Granted Permission sought to share information with : Family Supports Permission granted to share information with : Yes, Verbal Permission Granted  Share Information with NAME: Karna Budge  Permission granted to share info w AGENCY: [facility]  Permission granted to share info w Relationship: Daughter  Permission granted to share info w Contact Information: [phone]  Emotional Assessment Appearance:: Well-Groomed, Appears stated age Attitude/Demeanor/Rapport: Engaged Affect (typically observed): Stable, Pleasant, Appropriate, Accepting Orientation: : Oriented to Self, Oriented to Place,  Oriented to  Time, Oriented to Situation Alcohol / Substance Use: Not Applicable Psych Involvement: No (comment)  Admission  diagnosis:  Acute diverticulitis [K57.92] Recurrent diverticulitis [K57.92] Patient Active Problem List   Diagnosis Date Noted   Acute diverticulitis 08/06/2024   Pathological fracture 08/06/2024   Abdominal pain 08/06/2024   Diverticulitis of intestine without perforation or abscess 07/28/2024   Permanent atrial fibrillation with RVR (HCC) 07/27/2024   Rotator cuff tear arthropathy 08/01/2023   S/P total left hip arthroplasty 11/09/2022   Osteoarthritis of left knee 09/22/2022   Osteoarthritis of right knee 04/07/2022   Chronic kidney disease, stage 3a (HCC) 02/06/2022   Peripheral venous insufficiency 02/06/2022   Varicose veins of bilateral lower extremities with other complications 02/06/2022   Peripheral vascular disease 01/30/2022   Thrombophilia 12/15/2019   Vitamin D  deficiency 08/25/2019   Primary osteoarthritis of both knees 08/16/2019   Sensorineural hearing loss (SNHL) of right ear with unrestricted hearing of left ear 03/08/2019   Hypertensive heart disease with congestive heart failure and chronic kidney disease (HCC) 03/06/2019   Unilateral primary osteoarthritis, right knee 04/06/2018   Dermatitis 12/29/2017   Impacted cerumen 09/24/2017   Asymptomatic microscopic hematuria 06/24/2017   Trigger finger, right middle finger 02/16/2017   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) 05/26/2016   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/12/2016   Chronic kidney disease due to hypertension 02/22/2015   Disorder of bone 06/21/2013   Gout 04/26/2012   Hyperlipidemia 04/26/2012   Proteinuria 04/26/2012   Paroxysmal atrial fibrillation (HCC) 12/08/2011   Underimmunization status 08/01/2010   Impaired fasting glucose 01/07/2010   Allergic rhinitis 12/11/2009   Anemia 12/11/2009   Anxiety disorder 12/11/2009   Diaphragmatic hernia 12/11/2009   Primary hypertension 12/11/2009   Fibromyalgia 12/11/2009   PCP:  Shayne Anes, MD Pharmacy:    [facility] [phone] - [address] [place] - [address] [place] [address] Phone: [phone] Fax: [phone]   Social Drivers of Health (SDOH) Social History: SDOH Screenings   Food Insecurity: No Food Insecurity (08/06/2024)  Housing: Low Risk  (08/06/2024)  Transportation Needs: No Transportation Needs (08/06/2024)  Utilities: At Risk (08/06/2024)  Social Connections: Moderately Integrated (08/06/2024)  Tobacco Use: Low Risk  (08/06/2024)   SDOH Interventions: None indicated     Readmission Risk Interventions    08/09/2024    2:21 PM  Readmission Risk Prevention Plan  Transportation Screening Complete  PCP or Specialist Appt within 5-7 Days Complete  Home Care Screening Complete  Medication Review (RN CM) Complete    Signed: Heather Saltness, MSW, LCSW Clinical Social Worker Inpatient Care Management 08/09/2024 2:31 PM

## 2024-08-09 NOTE — Progress Notes (Signed)
 Occupational Therapy Treatment Patient Details Name: Kathryn Ayala MRN: [id number] DOB: [birth year] Today's Date: 08/09/2024   History of present illness Pt is 89 yo female admitted on 08/06/24 with acute sigmoid descending colon diverticulitis.  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 Pt feels close to baseline, reports at times it takes her an hour to get dressed at home due to BLE stiffness/pain. Pt able to ambulate around room with supervision for safety using RW, increased time. Pt min A for LB dressing, don/doff socks, likely close to baseline, HHOT follow up still recommended, Pt reports she has all DME needed to remain mod I at home. Will continue to see acutely to progress as able.      If plan is discharge home, recommend the following:  A little help with walking and/or transfers;A little help with bathing/dressing/bathroom;Assistance with cooking/housework;Assist for transportation;Help with stairs or ramp for entrance   Equipment Recommendations  None recommended by OT    Recommendations for Other Services      Precautions / Restrictions Precautions Precautions: Fall Recall of Precautions/Restrictions: Intact Restrictions Weight Bearing Restrictions Per Provider Order: No       Mobility Bed Mobility Overal bed mobility: Needs Assistance Bed Mobility: Supine to Sit, Sit to Supine     Supine to sit: Supervision, HOB elevated, Used rails Sit to supine: Min assist   General bed mobility comments: CGA to sit EOB, min A assist back to bed for BLEs    Transfers Overall transfer level: Needs assistance Equipment used: Rolling walker (2 wheels) Transfers: Sit to/from Stand Sit to Stand:  Supervision           General transfer comment: supervision for safety with RW     Balance Overall balance assessment: Needs assistance Sitting-balance support: No upper extremity supported, Feet supported Sitting balance-Leahy Scale: Good     Standing balance support: Bilateral upper extremity supported, During functional activity Standing balance-Leahy Scale: Fair Standing balance comment: able to stand with light support of RW                           ADL either performed or assessed with clinical judgement   ADL Overall ADL's : Needs assistance/impaired                                       General ADL Comments: overall set up/supervision, increased time for ADLs, has had difficulty with LB dressing due to weakness, min A to assist back to bed.    Extremity/Trunk Assessment              Vision       Restaurant manager, fast food Communication: No apparent difficulties   Cognition Arousal: Alert Behavior During Therapy: WFL for tasks assessed/performed Cognition: No apparent impairments                               Following commands: Intact        Cueing   Cueing Techniques: Verbal cues  Exercises      Shoulder Instructions  General Comments      Pertinent Vitals/ Pain       Pain Assessment Pain Assessment: Faces Faces Pain Scale: Hurts a little bit Pain Location: lower abdomen Pain Descriptors / Indicators: Discomfort, Grimacing, Guarding Pain Intervention(s): Monitored during session  Home Living                                          Prior Functioning/Environment              Frequency  Min 2X/week        Progress Toward Goals  OT Goals(current goals can now be found in the care plan section)  Progress towards OT goals: Progressing toward goals  Acute Rehab OT Goals Patient Stated Goal: to improve BLE strength OT Goal  Formulation: With patient Time For Goal Achievement: 08/21/24 Potential to Achieve Goals: Good ADL Goals Pt Will Perform Upper Body Dressing: with modified independence Pt Will Perform Lower Body Dressing: with modified independence Pt Will Transfer to Toilet: with modified independence;ambulating;regular height toilet Pt Will Perform Toileting - Clothing Manipulation and hygiene: with modified independence;sit to/from stand Additional ADL Goal #1: Pt will gather items for ADLs with RW mod I. Additional ADL Goal #2: Pt will complete basic ADLs mod I.  Plan      Co-evaluation                 AM-PAC OT 6 Clicks Daily Activity     Outcome Measure   Help from another person eating meals?: None Help from another person taking care of personal grooming?: A Little Help from another person toileting, which includes using toliet, bedpan, or urinal?: A Little Help from another person bathing (including washing, rinsing, drying)?: A Little Help from another person to put on and taking off regular upper body clothing?: A Little Help from another person to put on and taking off regular lower body clothing?: A Little 6 Click Score: 19    End of Session Equipment Utilized During Treatment: Gait belt;Rolling walker (2 wheels)  OT Visit Diagnosis: Unsteadiness on feet (R26.81);Muscle weakness (generalized) (M62.81)   Activity Tolerance Patient tolerated treatment well   Patient Left in bed;with call bell/phone within reach;with bed alarm set   Nurse Communication Mobility status        Time: 8595-8580 OT Time Calculation (min): 15 min  Charges: OT General Charges $OT Visit: 1 Visit OT Treatments $Therapeutic Activity: 8-22 mins  [address], OTR/L   Elouise JONELLE Bott 08/09/2024, 2:28 PM

## 2024-08-10 DIAGNOSIS — K5792 Diverticulitis of intestine, part unspecified, without perforation or abscess without bleeding: Secondary | ICD-10-CM | POA: Diagnosis not present

## 2024-08-10 LAB — CBC
HCT: 46.2 % — ABNORMAL HIGH (ref 36.0–46.0)
Hemoglobin: 13.9 g/dL (ref 12.0–15.0)
MCH: 29.8 pg (ref 26.0–34.0)
MCHC: 30.1 g/dL (ref 30.0–36.0)
MCV: 98.9 fL (ref 80.0–100.0)
Platelets: 323 K/uL (ref 150–400)
RBC: 4.67 MIL/uL (ref 3.87–5.11)
RDW: 14.4 % (ref 11.5–15.5)
WBC: 5.5 K/uL (ref 4.0–10.5)
nRBC: 0 % (ref 0.0–0.2)

## 2024-08-10 LAB — BASIC METABOLIC PANEL WITH GFR
Anion gap: 12 (ref 5–15)
BUN: 11 mg/dL (ref 8–23)
CO2: 21 mmol/L — ABNORMAL LOW (ref 22–32)
Calcium: 10 mg/dL (ref 8.9–10.3)
Chloride: 106 mmol/L (ref 98–111)
Creatinine, Ser: 0.67 mg/dL (ref 0.44–1.00)
GFR, Estimated: >60 mL/min (ref >60)
Glucose, Bld: 90 mg/dL (ref 70–99)
Potassium: 4.2 mmol/L (ref 3.5–5.1)
Sodium: 140 mmol/L (ref 135–145)

## 2024-08-10 MED ORDER — HYDRALAZINE HCL 10 MG PO TABS
10.0000 mg | ORAL_TABLET | Freq: Three times a day (TID) | ORAL | Status: DC
  Administered 2024-08-10 – 2024-08-11 (×4): 10 mg via ORAL
  Filled 2024-08-10 (×4): qty 1

## 2024-08-10 NOTE — Progress Notes (Signed)
 Physical Therapy Treatment Patient Details Name: Kathryn Ayala MRN: [id number] DOB: August 01, 1935 Today's Date: 08/10/2024   History of Present Illness Pt is 89 yo female admitted on 08/06/24 with acute sigmoid descending colon diverticulitis.  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  Pt currently with functional limitations due to the deficits listed below (see PT Problem List). Pt in bed when PT arrived. Pt agreeable to therapy session. S for supine to sit, S for sit to stand  from EOB with min cues, CGA progressing to S for gait tasks in hallway with min cues for posture and RW management with L veer and occasional cues for obstacle navigation 250 feet. Pt returned to room and seated in recliner and visitor arrived, pt and visitor ed provided on requesting nursing assist at any time to get up and OOB to sit in recliner. Pt verbalized understanding. Pt left in recliner, visitor present and all needs in place. Pt will benefit from acute skilled PT to increase their independence and safety with mobility to allow discharge.      If plan is discharge home, recommend the following: A little help with walking and/or transfers;A little help with bathing/dressing/bathroom;Assistance with cooking/housework   Can travel by private vehicle        Equipment Recommendations  None recommended by PT    Recommendations for Other Services       Precautions / Restrictions Precautions Precautions: Fall Recall of Precautions/Restrictions: Intact Precaution/Restrictions Comments: walks away from [employer] Restrictions Weight Bearing Restrictions Per Provider Order: No     Mobility  Bed Mobility Overal bed mobility: Needs Assistance Bed Mobility: Supine to  Sit     Supine to sit: Supervision, HOB elevated, Used rails     General bed mobility comments: min cues and HOB slightly elevated    Transfers Overall transfer level: Needs assistance Equipment used: Rolling walker (2 wheels) Transfers: Sit to/from Stand, Bed to chair/wheelchair/BSC Sit to Stand: Supervision           General transfer comment: supervision for safety with RW and cues    Ambulation/Gait Ambulation/Gait assistance: Contact guard assist, Supervision Gait Distance (Feet): 250 Feet Assistive device: Rolling walker (2 wheels) Gait Pattern/deviations: Step-through pattern, Decreased stride length, Drifts right/left Gait velocity: decreased     General Gait Details: pt required min cues for RW management and posture for prolonged gait bout in hallway with noted L veer, no reports of SOB or pain pt stated that the medication was making her a little light headed   Optometrist     Tilt Bed    Modified Rankin (Stroke Patients Only)       Balance Overall balance assessment: Needs assistance Sitting-balance support: No upper extremity supported, Feet supported Sitting balance-Leahy Scale: Good     Standing balance support: Bilateral upper extremity supported, During functional activity Standing balance-Leahy Scale: Fair Standing balance comment: able to stand with light support of RW                            Communication Communication Communication: No apparent difficulties  Cognition Arousal: Alert Behavior During Therapy: WFL for tasks assessed/performed   PT - Cognitive impairments: No apparent impairments  Following commands: Intact      Cueing Cueing Techniques: Verbal cues  Exercises      General Comments        Pertinent Vitals/Pain Pain Assessment Pain Assessment: Faces Faces Pain Scale: Hurts a little bit Pain Location: lower abdomen Pain  Descriptors / Indicators: Discomfort, Grimacing, Guarding Pain Intervention(s): Limited activity within patient's tolerance, Monitored during session, Repositioned    Home Living                          Prior Function            PT Goals (current goals can now be found in the care plan section) Acute Rehab PT Goals Patient Stated Goal: To get stronger PT Goal Formulation: With patient Time For Goal Achievement: 08/21/24 Potential to Achieve Goals: Good Progress towards PT goals: Progressing toward goals    Frequency    Min 2X/week      PT Plan      Co-evaluation              AM-PAC PT 6 Clicks Mobility   Outcome Measure  Help needed turning from your back to your side while in a flat bed without using bedrails?: A Little Help needed moving from lying on your back to sitting on the side of a flat bed without using bedrails?: A Little Help needed moving to and from a bed to a chair (including a wheelchair)?: A Little Help needed standing up from a chair using your arms (e.g., wheelchair or bedside chair)?: A Little Help needed to walk in hospital room?: A Little Help needed climbing 3-5 steps with a railing? : A Little 6 Click Score: 18    End of Session Equipment Utilized During Treatment: Gait belt Activity Tolerance: No increased pain;Patient tolerated treatment well Patient left: in chair;with call bell/phone within reach;with chair alarm set;with family/visitor present Nurse Communication: Mobility status PT Visit Diagnosis: Other abnormalities of gait and mobility (R26.89);Muscle weakness (generalized) (M62.81)     Time: 8374-8355 PT Time Calculation (min) (ACUTE ONLY): 19 min  Charges:    $Gait Training: 8-22 mins PT General Charges $$ ACUTE PT VISIT: 1 Visit                     [place], PT Acute Rehab    [place] VEAR Drone 08/10/2024, 6:01 PM

## 2024-08-10 NOTE — Progress Notes (Signed)
 PROGRESS NOTE Kathryn Ayala  FMW:[id number] DOB: [birth year] DOA: 08/06/2024 PCP: Shayne Anes, MD  Brief Narrative/Hospital Course: Kathryn Ayala is a 89 y.o. 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 Labs UA ok,Only 6-10 WBCs. Wbc-11.7, hb 15.0 g/dL platelets 708.  Lipase is normal.  CMP showed a glucose of 125 mg/dL, AST of 51 units/L, sodium 133 and CO2 of 21 mmol/L with a normal anion gap.  The rest of the electrolytes, the rest of the LFTs and renal function were normal. CT abdomen/pelvis with contrast>> again showing circumferential wall thickening with adjacent fat stranding along the confluence of the sigmoid colon with the descending colon with asymmetric pericolonic enhancement in this area reflecting a degree of peritonitis.  Small volume free fluid in the pelvis.  No definite free air or focal drainable fluid collection.  - acute diverticulitis. Question iliac vein reactive thrombosis, but likely this might be artifactual.  Consider correlation with DVT ultrasound.  Subtle nodularity of the liver contours could reflect cirrhosis.  Aortic atherosclerosis. Patient was given IV antibiotic and admitted for further management.  Subjective: Seen and examined Complains of headache when blood pressure is high especially during evening at night She vomited yesterday and diet changed to full liquid diet She reports she has problem with esophageal spasm and sometimes gets a vomiting episode especially after chicken or Stevia use and attrribites to same Wants to try soft diet  today and changed No significant abdominal tenderness or pain, does have some soreness Hesitant to go home wants to see how her blood pressure does and if she is able to tolerate diet today  Assessment and plan:  Acute sigmoid descending colon diverticulitis History of diverticulitis: Recent admission and discharged on 10/11 for acute diverticulitis, she feels she was discharged too early. Initial leukocytosis resolved, patient is symptomatically improving,, tolerating soft diet overall feeling better Patient admitted and managing with IV ceftriaxone Flagyl and PPI pain management analgesic antiemetics No significant tenderness, WBC normal. C diff  NEG, GI pathogen panel negative, added probiotics Needs colonoscopy in 6 weeks and is aware. She reports she has problem with esophageal spasm and sometimes gets a vomiting episode especially after chicken or Stevia use and attrribites to same Wants to try soft diet today and changed- will see how she does with it-again hesitant to go home,   Question iliac vein reactive thrombosis, but likely this might be artifactual: Duplex obtained 10/19 no DVT in lower extremity  PAF: Rate controlled cont home Eliquis , atenolol .  Chronic kidney disease, stage 3a: Renal function stable at baseline.  Monitor, ensure adequate hydration  Hypertension: BP at times poorly controlled complains of headache.  Continue atenolol , added hydralazine 10 mg tid (can make it PRN at home)  Hyperlipidemia: Continue statin.  Gout: Continue allopurinol   Impaired fasting glucose Trend cbg  Mobility: PT Orders: Active  PT Follow up Rec: Home Health Pt10/21/2025 1500   DVT prophylaxis:  Code Status:   Code Status: Full Code Family Communication: plan of care discussed with patient at bedside. Patient status is: Remains hospitalized because of severity of illness Level of care: Telemetry   Dispo: The patient is from: home alone  Anticipated  disposition: Anticipating discharge home tomorrow   Objective: Vitals last 24 hrs: Vitals:   08/08/24 2131 08/09/24 0434 08/09/24 1444 08/09/24 2010  BP: (!) 185/81 (!) 163/87 (!) 168/87 (!) 158/89  Pulse: 80 71 63 76  Resp: 20 18 20 18   Temp: 98.8 F (37.1 C) 98.5 F (36.9 C) 98 F (36.7 C) 98.3 F (36.8 C)  TempSrc: Oral  Oral Oral  SpO2: 99% 95% 100% 100%  Weight:   72.1 kg   Height:   5' 3 (1.6 m)    Physical Examination: General exam: alert awake, oriented x 4 HEENT:Oral mucosa moist, Ear/Nose WNL grossly Respiratory system: CTA b/l  Cardiovascular system: S1 & S2 +, No JVD. Gastrointestinal system: Abdomen soft , no guarding rigidity minimal tenderness on the left/lower abdomen  Nervous System: Alert, awake, moving all extremities,and following commands. Extremities: extremities warm, leg edema neg Skin: Warm, no rashes. MSK: Normal muscle bulk,tone, power.   Medications reviewed:  Scheduled Meds:  allopurinol   100 mg Oral BID   apixaban   5 mg Oral BID   atenolol   50 mg Oral BID   hydrALAZINE  10 mg Oral TID   loratadine   10 mg Oral Daily   rosuvastatin   5 mg Oral Daily   saccharomyces boulardii  250 mg Oral BID   venlafaxine  XR  37.5 mg Oral Daily   Continuous Infusions:  cefTRIAXone (ROCEPHIN)  IV 2 g (08/09/24 1025)   lactated ringers  75 mL/hr at 08/09/24 1349   metronidazole 500 mg (08/09/24 2152)   promethazine (PHENERGAN) injection (IM or IVPB) 6.25 mg (08/09/24 1339)   Diet: Diet Order             DIET SOFT Room service appropriate? Yes; Fluid consistency: Thin  Diet effective now                    Data Reviewed: I have personally reviewed following labs and imaging studies ( see epic result tab) CBC: Recent Labs  Lab 08/06/24 0903 08/07/24 0640 08/08/24 0515 08/09/24 0552 08/10/24 0626  WBC 11.7* 9.9 6.6 5.5 5.5  HGB 15.0 14.2 13.7 13.2 13.9  HCT 49.9* 44.6 44.9 43.3 46.2*  MCV 97.3 96.3 97.2 98.6 98.9  PLT 291 268 269 291 323    CMP: Recent Labs  Lab 08/06/24 0903 08/07/24 0640 08/08/24 0515 08/09/24 0552 08/10/24 0626  NA 133* 137 137 137 140  K 4.6 4.1 4.1 3.6 4.2  CL 99 103 104 105 106  CO2 21* 23 20* 19* 21*  GLUCOSE 125* 108* 95 156* 90  BUN 14 12 16 15 11   CREATININE 0.70 0.64 0.65 0.74 0.67  CALCIUM  10.0 9.7 9.6 9.6 10.0   GFR: Estimated Creatinine Clearance: 47.2 mL/min (by C-G formula based on SCr of 0.67 mg/dL). Recent Labs  Lab 08/06/24 0903 08/07/24 0640  AST 51* 28  ALT 42 28  ALKPHOS 120 111  BILITOT 0.9 0.7  PROT 7.9 7.0  ALBUMIN  4.3 3.9    Recent Labs  Lab 08/06/24 0903  LIPASE 41   No results for input(s): AMMONIA in the last 168 hours. Coagulation Profile: No results for input(s): INR, PROTIME in the last 168 hours. Unresulted Labs (From admission, onward)     Start     Ordered   08/08/24 0500  Basic metabolic panel with GFR  Daily,   R      08/07/24 0812   08/08/24 0500  CBC  Daily,   R  08/07/24 9187           Antimicrobials/Microbiology: Anti-infectives (From admission, onward)    Start     Dose/Rate Route Frequency Ordered Stop   08/06/24 1245  cefTRIAXone (ROCEPHIN) 2 g in sodium chloride  0.9 % 100 mL IVPB        2 g 200 mL/hr over 30 Minutes Intravenous Daily 08/06/24 1228     08/06/24 1245  metroNIDAZOLE (FLAGYL) IVPB 500 mg        500 mg 100 mL/hr over 60 Minutes Intravenous 2 times daily 08/06/24 1228           Component Value Date/Time   SDES  07/27/2024 2037    URINE, RANDOM Performed at [facility], [address], [place], [place] [address]    SPECREQUEST  07/27/2024 2037    NONE Reflexed from [id number] Performed at [facility], [address], [place], [place] [address]    CULT (A) 07/27/2024 2037    <10,000 COLONIES/mL INSIGNIFICANT GROWTH Performed at [facility], [address] [place], [place] [address]    REPTSTATUS 07/29/2024 FINAL 07/27/2024 2037    Procedures:   Mennie LAMY, MD [facility] 08/10/2024, 9:51 AM

## 2024-08-11 DIAGNOSIS — K5792 Diverticulitis of intestine, part unspecified, without perforation or abscess without bleeding: Secondary | ICD-10-CM | POA: Diagnosis not present

## 2024-08-11 LAB — CBC
HCT: 44.4 % (ref 36.0–46.0)
Hemoglobin: 13.8 g/dL (ref 12.0–15.0)
MCH: 30.8 pg (ref 26.0–34.0)
MCHC: 31.1 g/dL (ref 30.0–36.0)
MCV: 99.1 fL (ref 80.0–100.0)
Platelets: 302 K/uL (ref 150–400)
RBC: 4.48 MIL/uL (ref 3.87–5.11)
RDW: 14.2 % (ref 11.5–15.5)
WBC: 5.6 K/uL (ref 4.0–10.5)
nRBC: 0 % (ref 0.0–0.2)

## 2024-08-11 LAB — BASIC METABOLIC PANEL WITH GFR
Anion gap: 12 (ref 5–15)
BUN: 14 mg/dL (ref 8–23)
CO2: 22 mmol/L (ref 22–32)
Calcium: 10.1 mg/dL (ref 8.9–10.3)
Chloride: 105 mmol/L (ref 98–111)
Creatinine, Ser: 0.73 mg/dL (ref 0.44–1.00)
GFR, Estimated: >60 mL/min (ref >60)
Glucose, Bld: 103 mg/dL — ABNORMAL HIGH (ref 70–99)
Potassium: 4.3 mmol/L (ref 3.5–5.1)
Sodium: 139 mmol/L (ref 135–145)

## 2024-08-11 MED ORDER — SACCHAROMYCES BOULARDII 250 MG PO CAPS: 250.0000 mg | ORAL_CAPSULE | Freq: Two times a day (BID) | ORAL | Status: AC

## 2024-08-11 MED ORDER — HYDRALAZINE HCL 10 MG PO TABS
5.0000 mg | ORAL_TABLET | Freq: Three times a day (TID) | ORAL | Status: DC
Start: 2024-08-11 — End: 2024-08-11

## 2024-08-11 MED ORDER — HYDRALAZINE HCL 10 MG PO TABS: 5.0000 mg | ORAL_TABLET | Freq: Every day | ORAL | 0 refills | Status: DC | PRN

## 2024-08-11 MED ORDER — CIPROFLOXACIN HCL 500 MG PO TABS
500.0000 mg | ORAL_TABLET | Freq: Once | ORAL | Status: AC
  Administered 2024-08-11: 500 mg via ORAL
  Filled 2024-08-11: qty 1

## 2024-08-11 MED ORDER — METRONIDAZOLE 500 MG PO TABS
500.0000 mg | ORAL_TABLET | Freq: Once | ORAL | Status: AC
  Administered 2024-08-11: 500 mg via ORAL
  Filled 2024-08-11: qty 1

## 2024-08-11 MED ORDER — SODIUM CHLORIDE 0.9 % IV SOLN: 2.0000 g | Freq: Once | INTRAVENOUS | Status: DC

## 2024-08-11 MED ORDER — METRONIDAZOLE 500 MG/100ML IV SOLN: 500.0000 mg | Freq: Once | INTRAVENOUS | Status: DC

## 2024-08-11 NOTE — Plan of Care (Signed)
 Patient discharge instructions discussed with patient, patient verbalized all understanding and denies any questions or concerns. PIV removed,VS stable. Patient going home by private vehicle.   Problem: Education: Goal: Knowledge of General Education information will improve Description: Including pain rating scale, medication(s)/side effects and non-pharmacologic comfort measures Outcome: Adequate for Discharge   Problem: Health Behavior/Discharge Planning: Goal: Ability to manage health-related needs will improve Outcome: Adequate for Discharge   Problem: Clinical Measurements: Goal: Ability to maintain clinical measurements within normal limits will improve Outcome: Adequate for Discharge Goal: Will remain free from infection Outcome: Adequate for Discharge Goal: Diagnostic test results will improve Outcome: Adequate for Discharge Goal: Respiratory complications will improve Outcome: Adequate for Discharge Goal: Cardiovascular complication will be avoided Outcome: Adequate for Discharge   Problem: Activity: Goal: Risk for activity intolerance will decrease Outcome: Adequate for Discharge   Problem: Nutrition: Goal: Adequate nutrition will be maintained Outcome: Adequate for Discharge   Problem: Coping: Goal: Level of anxiety will decrease Outcome: Adequate for Discharge   Problem: Elimination: Goal: Will not experience complications related to bowel motility Outcome: Adequate for Discharge Goal: Will not experience complications related to urinary retention Outcome: Adequate for Discharge   Problem: Pain Managment: Goal: General experience of comfort will improve and/or be controlled Outcome: Adequate for Discharge   Problem: Safety: Goal: Ability to remain free from injury will improve Outcome: Adequate for Discharge   Problem: Skin Integrity: Goal: Risk for impaired skin integrity will decrease Outcome: Adequate for Discharge

## 2024-08-11 NOTE — Progress Notes (Signed)
 Occupational Therapy Treatment Patient Details Name: Kathryn Ayala MRN: [id number] DOB: [birth year] Today's Date: 08/11/2024   History of present illness Pt is 89 yo female admitted on 08/06/24 with acute sigmoid descending colon diverticulitis.  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 Upon arrival to room pt seated in recliner, agreeable to tx. Pt endorsees concerns with LB/UB dressing as well as bathing. Pt educated on LB dressing with the use of sock aid and reacher to don/doff in sitting. Further educated for alternate sock donning method via towel technique to utilize in the mean time of ordered sock aid arrival. Pt demonstrated learned technique for all above education. Pt ambulated into BR to void in toilet with use of RW with supervision, MIN verbal cues for hand placement and DME management. Pt making good progress toward goals, will continue to follow POC. Discharge recommendation remains appropriate.        If plan is discharge home, recommend the following:  A little help with walking and/or transfers;A little help with bathing/dressing/bathroom;Assistance with cooking/housework;Assist for transportation;Help with stairs or ramp for entrance   Equipment Recommendations  None recommended by OT    Recommendations for Other Services      Precautions / Restrictions Precautions Precautions: Fall Recall of Precautions/Restrictions: Intact Restrictions Weight Bearing Restrictions Per Provider Order: No       Mobility Bed Mobility               General bed mobility comments: Pt in recliner pre/post session    Transfers Overall transfer level: Needs assistance Equipment used: Rolling walker (2 wheels) Transfers:  Sit to/from Stand Sit to Stand: Supervision           General transfer comment: Min verbal cues for hand placement during transfers     Balance Overall balance assessment: Needs assistance Sitting-balance support: No upper extremity supported, Feet supported Sitting balance-Leahy Scale: Good Sitting balance - Comments: Steady reaching outside BOS   Standing balance support: Bilateral upper extremity supported, During functional activity Standing balance-Leahy Scale: Fair                             ADL either performed or assessed with clinical judgement   ADL Overall ADL's : Needs assistance/impaired     Grooming: Wash/dry hands;Standing;Supervision/safety           Upper Body Dressing : Sitting;Supervision/safety;Cueing for compensatory techniques Upper Body Dressing Details (indicate cue type and reason): Donning bra Lower Body Dressing: Minimal assistance;Cueing for compensatory techniques;With adaptive equipment;Sit to/from stand   Toilet Transfer: Supervision/safety;Rolling walker (2 wheels);Ambulation;Comfort height toilet   Toileting- Clothing Manipulation and Hygiene: Modified independent;Sitting/lateral lean       Functional mobility during ADLs: Supervision/safety;Rolling walker (2 wheels) General ADL Comments: Educated on AD for LB dressing and bathing     Communication Communication Communication: No apparent difficulties   Cognition Arousal: Alert Behavior During Therapy: WFL for tasks assessed/performed                                 Following commands: Intact        Cueing      Exercises Exercises: Other exercises Other Exercises Other Exercises: Edu: LB, UE and  bathing techniques with the use of assistive devices    Shoulder Instructions       General Comments Vitals beginning of session 130/76 (92), 64bpm. end of session following mobility 150/78 (100), 67bpm.    Pertinent Vitals/ Pain       Pain  Assessment Pain Assessment: Faces Faces Pain Scale: Hurts a little bit Pain Location: L shoulder Pain Descriptors / Indicators: Pressure Pain Intervention(s): Monitored during session, Limited activity within patient's tolerance                                                          Frequency  Min 2X/week        Progress Toward Goals  OT Goals(current goals can now be found in the care plan section)  Progress towards OT goals: Progressing toward goals  Acute Rehab OT Goals OT Goal Formulation: With patient Time For Goal Achievement: 08/21/24 Potential to Achieve Goals: Good ADL Goals Pt Will Perform Upper Body Dressing: with modified independence Pt Will Perform Lower Body Dressing: with modified independence Pt Will Transfer to Toilet: with modified independence;ambulating;regular height toilet Pt Will Perform Toileting - Clothing Manipulation and hygiene: with modified independence;sit to/from stand Additional ADL Goal #1: Pt will gather items for ADLs with RW mod I. Additional ADL Goal #2: Pt will complete basic ADLs mod I.   AM-PAC OT 6 Clicks Daily Activity     Outcome Measure   Help from another person eating meals?: None Help from another person taking care of personal grooming?: A Little Help from another person toileting, which includes using toliet, bedpan, or urinal?: A Little Help from another person bathing (including washing, rinsing, drying)?: A Little Help from another person to put on and taking off regular upper body clothing?: A Little Help from another person to put on and taking off regular lower body clothing?: A Little 6 Click Score: 19    End of Session Equipment Utilized During Treatment: Gait belt;Rolling walker (2 wheels)  OT Visit Diagnosis: Unsteadiness on feet (R26.81);Muscle weakness (generalized) (M62.81)   Activity Tolerance Patient tolerated treatment well   Patient Left in chair;with call bell/phone  within reach;with chair alarm set   Nurse Communication Mobility status        Time: 8867-8799 OT Time Calculation (min): 28 min  Charges: OT General Charges $OT Visit: 1 Visit OT Treatments $Self Care/Home Management : 23-37 mins  Larraine Colas M.S. OTR/L  08/11/24, 12:53 PM

## 2024-08-11 NOTE — TOC Transition Note (Signed)
 Transition of Care [facility]) - Discharge Note   Patient Details  Name: Kathryn Ayala MRN: [id number] Date of Birth: [birth year]  Transition of Care [facility]) CM/SW Contact:  Sonda Manuella Quill, RN Phone Number: 08/11/2024, 11:49 AM   Clinical Narrative:    D/C orders received; also consult placed for SNF; also IP CM  notified pt's dtr requested call to discuss SNF; spoke w/ pt's dtr Karna Budge [phone]); she said family has arranged schedule to be w/ pt in her home; explained recc is for HHPT/OT which has been arranged w/ [facility]; also explained SNF would be out of pocket cost for pt/family, and can be arranged w/ PCP; she verbalized understanding and requested [facility] and aide; Dr Juvenal notified via secure chat; orders received for requested services; Cory at [facility] notified; he said agency can provide additional services; no IP CM needs.   Final next level of care: Home w Home Health Services Barriers to Discharge: No Barriers Identified   Patient Goals and CMS Choice Patient states their goals for this hospitalization and ongoing recovery are:: To return home CMS Medicare.gov Compare Post Acute Care list provided to:: Patient Choice offered to / list presented to : Patient [place] ownership interest in [facility].provided to:: Patient    Discharge Placement                       Discharge Plan and Services Additional resources added to the After Visit Summary for   In-house Referral: Clinical Social Work Discharge Planning Services: NA Post Acute Care Choice: Home Health          DME Arranged: N/A DME Agency: NA Date DME Agency Contacted: 08/09/24 Time DME Agency Contacted: 1427 Representative spoke with at DME Agency: [facility] HH Arranged: PT, OT HH Agency: [facility] Date [facility] Agency Contacted: 08/09/24 Time HH Agency Contacted: 1425 Representative spoke with at [facility] Agency: [facility]  Social Drivers of Health (SDOH) Interventions SDOH  Screenings   Food Insecurity: No Food Insecurity (08/06/2024)  Housing: Low Risk  (08/06/2024)  Transportation Needs: No Transportation Needs (08/06/2024)  Utilities: At Risk (08/06/2024)  Social Connections: Moderately Integrated (08/06/2024)  Tobacco Use: Low Risk  (08/06/2024)     Readmission Risk Interventions    08/09/2024    2:21 PM  Readmission Risk Prevention Plan  Transportation Screening Complete  PCP or Specialist Appt within 5-7 Days Complete  Home Care Screening Complete  Medication Review (RN CM) Complete

## 2024-08-11 NOTE — Discharge Summary (Signed)
 Physician Discharge Summary  Kathryn Ayala FMW:[id number] DOB: January 17, 1935 DOA: 08/06/2024  PCP: Shayne Anes, MD  Admit date: 08/06/2024 Discharge date: 08/11/2024  Admitted From:  Discharge disposition: Home   Recommendations for Outpatient Follow-Up:   Patient very anxious, will defer to PCP to start long-term anxiety medicine-for now have maximized her home health-family may need to consider assisted living facility Colonoscopy 6 weeks   Discharge Diagnosis:   Principal Problem:   Acute diverticulitis Active Problems:   Paroxysmal atrial fibrillation (HCC)   Chronic kidney disease, stage 3a (HCC)   Primary hypertension   Hyperlipidemia   Gout   Impaired fasting glucose   Abdominal pain    Discharge Condition: Improved.  Diet recommendation: Resume prior diet  Wound care: None.  Code status: Full.   History of Present Illness:   Kathryn Ayala is a 89 y.o. 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  She initially received ceftriaxone and metronidazole.  Then she was discharged home on ciprofloxacin and oral Flagyl, but her symptoms have worsened again.  She is again having fecal tenesmus and frequent loose stools.  Denied melena or hematochezia.  She denied fever, chills, rhinorrhea, sore throat, wheezing or hemoptysis.  No chest pain, palpitations, diaphoresis, PND, orthopnea or pitting edema of the lower extremities.  No flank pain, dysuria, frequency or hematuria.  No polyuria, polydipsia, polyphagia or blurred vision.      Hospital Course by Problem:   Acute  sigmoid descending colon diverticulitis History of diverticulitis: Recent admission and discharged on 10/11 for acute diverticulitis, she feels she was discharged too early. Initial leukocytosis resolved, patient is symptomatically improving,, tolerating soft diet overall feeling better Patient admitted and managing with IV ceftriaxone Flagyl and PPI pain management analgesic antiemetics No significant tenderness, WBC normal. C diff  NEG, GI pathogen panel negative, added probiotics Needs colonoscopy in 6 weeks and is aware.    Question iliac vein reactive thrombosis, but likely this might be artifactual: Duplex obtained 10/19 no DVT in lower extremity   PAF: Rate controlled cont home Eliquis , atenolol .   Chronic kidney disease, stage 3a: Renal function stable at baseline.  Monitor, ensure adequate hydration   Hypertension: BP at times poorly controlled complains of headache.  Continue atenolol , patient insistent on having another blood pressure medicine but current blood pressure after 5 mg of hydralazine is 118 so we will send home as needed  Anxiety Defer to PCP to start medication   Medical Consultants:      Discharge Exam:   Vitals:   08/11/24 0825 08/11/24 0915  BP: (!) 156/86 118/72  Pulse: 72   Resp:    Temp:    SpO2:     Vitals:   08/11/24 0307 08/11/24 0632 08/11/24 0825 08/11/24 0915  BP: (!) 159/83 (!) 156/86 (!) 156/86 118/72  Pulse: 69 72 72   Resp:  14    Temp:  (!) 97.5 F (36.4 C)    TempSrc:  Oral    SpO2: 98% 97%    Weight:      Height:        General exam: Appears calm and comfortable.    The results of significant diagnostics from this  hospitalization (including imaging, microbiology, ancillary and laboratory) are listed below for reference.     Procedures and Diagnostic Studies:   VAS US  LOWER EXTREMITY VENOUS (DVT) Result Date: 08/07/2024  Lower Venous DVT Study Patient Name:  Kathryn Ayala  Date of Exam:   08/07/2024 Medical  Rec #: [id number]           Accession #:    [id number] Date of Birth: [birth year]            Patient Gender: F Patient Age:   20 years Exam Location:  [facility] Procedure:      VAS US  LOWER EXTREMITY VENOUS (DVT) Referring Phys: DAVID ORTIZ --------------------------------------------------------------------------------  Indications: Swelling, Edema, and Questionable left iliac vein reactive thrombosis on CT ABD/PEL scan done yesterday.  Risk Factors: A-Fib, breast cancer, CVA, fibromyalgia, hypertension. Comparison Study: 02/06/22 - LEV reflux study showed negative for DVT. Positive                   for deep and superficial veins reflux. Performing Technologist: Ricka Sturdivant-Jones RDMS, RVT  Examination Guidelines: A complete evaluation includes B-mode imaging, spectral Doppler, color Doppler, and power Doppler as needed of all accessible portions of each vessel. Bilateral testing is considered an integral part of a complete examination. Limited examinations for reoccurring indications may be performed as noted. The reflux portion of the exam is performed with the patient in reverse Trendelenburg.  +-----+---------------+---------+-----------+----------+--------------+ RIGHTCompressibilityPhasicitySpontaneityPropertiesThrombus Aging +-----+---------------+---------+-----------+----------+--------------+ CFV  Full           Yes      Yes                                 +-----+---------------+---------+-----------+----------+--------------+ SFJ  Full                                                        +-----+---------------+---------+-----------+----------+--------------+   +---------+---------------+---------+-----------+----------+--------------+ LEFT     CompressibilityPhasicitySpontaneityPropertiesThrombus Aging +---------+---------------+---------+-----------+----------+--------------+ CFV      Full           Yes      Yes                                  +---------+---------------+---------+-----------+----------+--------------+ SFJ      Full                                                        +---------+---------------+---------+-----------+----------+--------------+ FV Prox  Full                                                        +---------+---------------+---------+-----------+----------+--------------+ FV Mid   Full           Yes      Yes                                 +---------+---------------+---------+-----------+----------+--------------+  FV DistalFull                                                        +---------+---------------+---------+-----------+----------+--------------+ PFV      Full                                                        +---------+---------------+---------+-----------+----------+--------------+ POP      Full           Yes      Yes                                 +---------+---------------+---------+-----------+----------+--------------+ PTV      Full                                                        +---------+---------------+---------+-----------+----------+--------------+ PERO     Full                                                        +---------+---------------+---------+-----------+----------+--------------+ EIV      Full           Yes      Yes                                 +---------+---------------+---------+-----------+----------+--------------+     Summary: RIGHT: - No evidence of common femoral vein obstruction.  LEFT: - There is no evidence of deep vein thrombosis in the lower extremity.  - No evidence of left external iliac vein thrombosis.  *See table(s) above for measurements and observations. Electronically signed by Lonni Gaskins MD on 08/07/2024 at 2:26:25 PM.    Final    CT ABDOMEN PELVIS W CONTRAST Result Date: 08/06/2024 CLINICAL DATA:  LLQ abdominal pain EXAM: CT ABDOMEN AND PELVIS WITH CONTRAST TECHNIQUE: Multidetector  CT imaging of the abdomen and pelvis was performed using the standard protocol following bolus administration of intravenous contrast. RADIATION DOSE REDUCTION: This exam was performed according to the departmental dose-optimization program which includes automated exposure control, adjustment of the mA and/or kV according to patient size and/or use of iterative reconstruction technique. CONTRAST:  100mL OMNIPAQUE IOHEXOL 300 MG/ML  SOLN COMPARISON:  July 27, 2024 FINDINGS: Evaluation is limited by streak artifact from hip arthroplasty. Lower chest: Cardiomegaly. Hepatobiliary: Gallbladder is unremarkable. Subtle nodularity of the liver contours. No focal hypoenhancing mass within the limitations of the exam. No extrahepatic biliary ductal dilation. Pancreas: Unremarkable. No pancreatic ductal dilatation or surrounding inflammatory changes. Spleen: Unremarkable. Adrenals/Urinary Tract: Adrenal glands are unremarkable. Kidneys enhance symmetrically. No hydronephrosis. Bladder is unremarkable within the limitations of the exam. Distal LEFT ureter is not visualized as it traverses past inflamed loop of colon  in the LEFT pelvis. Stomach/Bowel: Revisualization of circumferential wall thickening with adjacent fat stranding along the confluence of the sigmoid colon with the descending colon. There is asymmetric peritoneal enhancement in this area likely reflecting a degree of peritonitis. There is small volume free fluid. No definitive free air. Multiple diverticuli are noted throughout this area. Moderate hiatal hernia. No evidence of bowel obstruction. Appendix is surgically absent. Vascular/Lymphatic: Atherosclerotic calcifications of the nonaneurysmal abdominal aorta. Question asymmetric expansion of the LEFT external iliac vein as it passes by the inflamed loop of colon. Reproductive: Status post hysterectomy. No adnexal masses. Other: Small volume free fluid in the pelvis. No definitive free air. Musculoskeletal:  Asymmetric low-density within the LEFT psoas is favored to be due to fatty infiltration/atrophy no definitive retroperitoneal abscess at this time. Status post LEFT hip arthroplasty. Degenerative changes of the lumbar spine. IMPRESSION: 1. Revisualization of circumferential wall thickening with adjacent fat stranding along the confluence of the sigmoid colon with the descending colon. There is asymmetric peritoneal enhancement in this area likely reflecting a degree of peritonitis. There is small volume free fluid in the pelvis. No definitive free air or focal drainable fluid collection. Findings are favored to reflect acute diverticulitis. Recommend consideration of follow-up colonoscopy after resolution of acute symptoms to exclude underlying mass. 2. Question asymmetric expansion of the LEFT external iliac vein as it passes by the inflamed loop of colon. This could be artifactual in etiology but could reflect a reactive thrombosis. Consider correlation with DVT ultrasound. 3. Subtle nodularity of the liver contours could reflect underlying cirrhosis. Aortic Atherosclerosis (ICD10-I70.0). Electronically Signed   By: Corean Salter M.D.   On: 08/06/2024 10:58     Labs:   Basic Metabolic Panel: Recent Labs  Lab 08/07/24 0640 08/08/24 0515 08/09/24 0552 08/10/24 0626 08/11/24 0612  NA 137 137 137 140 139  K 4.1 4.1 3.6 4.2 4.3  CL 103 104 105 106 105  CO2 23 20* 19* 21* 22  GLUCOSE 108* 95 156* 90 103*  BUN 12 16 15 11 14   CREATININE 0.64 0.65 0.74 0.67 0.73  CALCIUM  9.7 9.6 9.6 10.0 10.1   GFR Estimated Creatinine Clearance: 47.2 mL/min (by C-G formula based on SCr of 0.73 mg/dL). Liver Function Tests: Recent Labs  Lab 08/06/24 0903 08/07/24 0640  AST 51* 28  ALT 42 28  ALKPHOS 120 111  BILITOT 0.9 0.7  PROT 7.9 7.0  ALBUMIN  4.3 3.9   Recent Labs  Lab 08/06/24 0903  LIPASE 41   No results for input(s): AMMONIA in the last 168 hours. Coagulation profile No results for  input(s): INR, PROTIME in the last 168 hours.  CBC: Recent Labs  Lab 08/07/24 0640 08/08/24 0515 08/09/24 0552 08/10/24 0626 08/11/24 0612  WBC 9.9 6.6 5.5 5.5 5.6  HGB 14.2 13.7 13.2 13.9 13.8  HCT 44.6 44.9 43.3 46.2* 44.4  MCV 96.3 97.2 98.6 98.9 99.1  PLT 268 269 291 323 302   Cardiac Enzymes: No results for input(s): CKTOTAL, CKMB, CKMBINDEX, TROPONINI in the last 168 hours. BNP: Invalid input(s): POCBNP CBG: No results for input(s): GLUCAP in the last 168 hours. D-Dimer No results for input(s): DDIMER in the last 72 hours. Hgb A1c No results for input(s): HGBA1C in the last 72 hours. Lipid Profile No results for input(s): CHOL, HDL, LDLCALC, TRIG, CHOLHDL, LDLDIRECT in the last 72 hours. Thyroid  function studies No results for input(s): TSH, T4TOTAL, T3FREE, THYROIDAB in the last 72 hours.  Invalid input(s): FREET3 Anemia work  up No results for input(s): VITAMINB12, FOLATE, FERRITIN, TIBC, IRON, RETICCTPCT in the last 72 hours. Microbiology Recent Results (from the past 240 hours)  C Difficile Quick Screen w PCR reflex     Status: None   Collection Time: 08/08/24 10:15 AM   Specimen: Stool  Result Value Ref Range Status   C Diff antigen NEGATIVE NEGATIVE Final   C Diff toxin NEGATIVE NEGATIVE Final   C Diff interpretation No C. difficile detected.  Final    Comment: Performed at [facility], [address], [place], [place] [address]  Gastrointestinal Panel by PCR , Stool     Status: Abnormal   Collection Time: 08/08/24 10:15 AM   Specimen: Stool  Result Value Ref Range Status   Campylobacter species NOT DETECTED NOT DETECTED Final   Plesimonas shigelloides NOT DETECTED NOT DETECTED Final   Salmonella species NOT DETECTED NOT DETECTED Final   Yersinia enterocolitica NOT DETECTED NOT DETECTED Final   Vibrio species NOT DETECTED NOT DETECTED Final   Vibrio cholerae NOT DETECTED NOT  DETECTED Final   Enteroaggregative E coli (EAEC) NOT DETECTED NOT DETECTED Final   Enteropathogenic E coli (EPEC) NOT DETECTED NOT DETECTED Final   Enterotoxigenic E coli (ETEC) NOT DETECTED NOT DETECTED Final   Shiga like toxin producing E coli (STEC) NOT DETECTED NOT DETECTED Final   Shigella/Enteroinvasive E coli (EIEC) NOT DETECTED NOT DETECTED Final   Cryptosporidium NOT DETECTED NOT DETECTED Final   Cyclospora cayetanensis NOT DETECTED NOT DETECTED Final   Entamoeba histolytica NOT DETECTED NOT DETECTED Final   Giardia lamblia NOT DETECTED NOT DETECTED Final   Adenovirus F40/41 NOT DETECTED NOT DETECTED Final   Astrovirus DETECTED (A) NOT DETECTED Final   Norovirus GI/GII NOT DETECTED NOT DETECTED Final   Rotavirus A NOT DETECTED NOT DETECTED Final   Sapovirus (I, II, IV, and V) NOT DETECTED NOT DETECTED Final    Comment: Performed at [facility], [address] [place], [place] [address]     Discharge Instructions:   Discharge Instructions     Discharge instructions   Complete by: As directed    Soft diet   Increase activity slowly   Complete by: As directed       Allergies as of 08/11/2024       Reactions   Hydrocodone  Other (See Comments)   Makes very confused Other Reaction(s): Not available hydrocodone    Penicillins Anaphylaxis, Swelling, Rash   Has patient had a PCN reaction causing immediate rash, facial/tongue/throat swelling, SOB or lightheadedness with hypotension: Yes Has patient had a PCN reaction causing severe rash involving mucus membranes or skin necrosis: Yes Has patient had a PCN reaction that required hospitalization No Has patient had a PCN reaction occurring within the last 10 years: No If all of the above answers are NO, then may proceed with Cephalosporin use.   Azithromycin Other (See Comments)   PT is unsure of reaction   Codeine Other (See Comments)   Patient was told to not take this   Levofloxacin Other (See Comments)    UNSPECIFIED REACTION Patient was told not to take it. Other Reaction(s): Not available Tolerates Cipro   Sulfa Antibiotics Itching, Rash, Dermatitis        Medication List     TAKE these medications    acetaminophen  500 MG tablet Commonly known as: TYLENOL  Take 1,000 mg by mouth every 6 (six) hours as needed for moderate pain.   allopurinol  100 MG tablet Commonly known as: ZYLOPRIM   Take 100 mg by mouth 2 (two) times daily.   apixaban  5 MG Tabs tablet Commonly known as: ELIQUIS  Take 1 tablet (5 mg total) by mouth 2 (two) times daily.   atenolol  50 MG tablet Commonly known as: TENORMIN  Take 50 mg by mouth 2 (two) times daily.   B-complex with vitamin C tablet Take 1 tablet by mouth daily.   Biotin 5000 5 MG Caps Generic drug: Biotin Take 5 mg by mouth in the morning.   feeding supplement Liqd Take 237 mLs by mouth 2 (two) times daily between meals.   ferrous sulfate  325 (65 FE) MG tablet Take 1 tablet (325 mg total) by mouth daily with breakfast.   loratadine  10 MG tablet Commonly known as: CLARITIN  Take 10 mg by mouth in the morning.   Melatonin 10 MG Tabs Take 10 mg by mouth at bedtime.   rosuvastatin  5 MG tablet Commonly known as: CRESTOR  Take 5 mg by mouth in the morning.   saccharomyces boulardii 250 MG capsule Commonly known as: FLORASTOR Take 1 capsule (250 mg total) by mouth 2 (two) times daily.   traMADol  50 MG tablet Commonly known as: Ultram  Take 1 tablet (50 mg total) by mouth every 6 (six) hours as needed for moderate pain (pain score 4-6) or severe pain (pain score 7-10).   venlafaxine  XR 37.5 MG 24 hr capsule Commonly known as: EFFEXOR -XR Take 37.5 mg by mouth in the morning.   vitamin D3 50 MCG (2000 UT) Caps Take 2,000 Units by mouth in the morning.        Contact information for after-discharge care     Home Medical Care     [facility]) .   Service: Home Health Services Contact information: [address] [place]   [address] [phone]                      Time coordinating discharge: 45 minutes  Signed:  Harlene RAYMOND Bowl DO  [facility] 08/11/2024, 1:25 PM

## 2024-08-11 NOTE — Progress Notes (Signed)
 Physical Therapy Treatment Patient Details Name: Kathryn Ayala MRN: [id number] DOB: September 17, 1935 Today's Date: 08/11/2024   History of Present Illness Pt is 89 yo female admitted on 08/06/24 with acute sigmoid descending colon diverticulitis.  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 Pt ambulated ~143ft with supervision and use of RW and performed stair negotiation x2 with CGA and cues for sequencing./hand placement and education on positioning of family members when assisting. Pt will benefit from continued skilled PT to increase their independence and maximize safety with mobility.      If plan is discharge home, recommend the following: A little help with walking and/or transfers;A little help with bathing/dressing/bathroom;Assistance with cooking/housework   Can travel by private vehicle        Equipment Recommendations  None recommended by PT    Recommendations for Other Services       Precautions / Restrictions Precautions Precautions: Fall Recall of Precautions/Restrictions: Intact Restrictions Weight Bearing Restrictions Per Provider Order: No     Mobility  Bed Mobility               General bed mobility comments: Pt in recliner pre/post session    Transfers Overall transfer level: Needs assistance Equipment used: Rolling walker (2 wheels) Transfers: Sit to/from Stand Sit to Stand: Supervision           General transfer comment: supervision for safety with RW and cues for proper hand placement with sit to/from stand transfers    Ambulation/Gait Ambulation/Gait assistance: Supervision Gait Distance (Feet): 160  Feet Assistive device: Rolling walker (2 wheels) Gait Pattern/deviations: Step-through pattern, Decreased stride length, Trunk flexed (mild trunk flexion) Gait velocity: decreased     General Gait Details: cues for keeping RW in contact with floor during turns, no overt LOB observed. Pt reports still feeling weak overall. Pt reporting that she owns RW and rollator and will more than likely use rollator more often than RW- offered to trial use of rollator during PT session, but pt deferred.   Stairs Stairs: Yes Stairs assistance: Contact guard assist Stair Management: One rail Left, Step to pattern, Sideways Number of Stairs: 2 General stair comments: education provided for hand placement and sequencing with stair negotiation, pt with preference for B UE on single railing for increased support for pt comfort. Educated on positioning for family when assisting. Discussed sue fo RW up/down single step to enter sunroom at home for support.   Wheelchair Mobility     Tilt Bed    Modified Rankin (Stroke Patients Only)       Balance Overall balance assessment: Needs assistance Sitting-balance support: No upper extremity supported, Feet supported Sitting balance-Leahy Scale: Good     Standing balance support: Bilateral upper extremity supported, During functional activity Standing balance-Leahy Scale: Fair Standing balance comment: able to perform static standing for brief period without use of UE support                            Communication Communication Communication: No apparent difficulties  Cognition Arousal: Alert Behavior During Therapy: WFL for tasks assessed/performed   PT - Cognitive impairments: No apparent impairments  Following commands: Intact      Cueing    Exercises      General Comments General comments (skin integrity, edema, etc.): Vitals beginning of session 130/76 (92), 64bpm. end of session following  mobility 150/78 (100), 67bpm.      Pertinent Vitals/Pain Pain Assessment Pain Assessment: Faces Faces Pain Scale: Hurts a little bit Pain Location: mild headache Pain Descriptors / Indicators: Headache Pain Intervention(s): Monitored during session    Home Living                          Prior Function            PT Goals (current goals can now be found in the care plan section) Acute Rehab PT Goals Patient Stated Goal: To get stronger PT Goal Formulation: With patient Time For Goal Achievement: 08/21/24 Potential to Achieve Goals: Good Progress towards PT goals: Progressing toward goals    Frequency    Min 2X/week      PT Plan      Co-evaluation              AM-PAC PT 6 Clicks Mobility   Outcome Measure  Help needed turning from your back to your side while in a flat bed without using bedrails?: A Little Help needed moving from lying on your back to sitting on the side of a flat bed without using bedrails?: A Little Help needed moving to and from a bed to a chair (including a wheelchair)?: A Little Help needed standing up from a chair using your arms (e.g., wheelchair or bedside chair)?: A Little Help needed to walk in hospital room?: A Little Help needed climbing 3-5 steps with a railing? : A Little 6 Click Score: 18    End of Session Equipment Utilized During Treatment: Gait belt Activity Tolerance: No increased pain;Patient tolerated treatment well Patient left: in chair;with call bell/phone within reach;with chair alarm set Nurse Communication: Mobility status PT Visit Diagnosis: Muscle weakness (generalized) (M62.81)     Time: 9048-8981 PT Time Calculation (min) (ACUTE ONLY): 27 min  Charges:    $Therapeutic Activity: 23-37 mins PT General Charges $$ ACUTE PT VISIT: 1 Visit                    Tinnie BERRY PT, DPT  Acute Rehabilitation Services  Office [phone]  08/11/2024, 10:48 AM

## 2024-08-11 NOTE — Progress Notes (Signed)
 Patient to be discharged today, she has requested to have IV discontinued. Per Dr.Vann, okay to d/c IV and telemetry at this time.

## 2024-08-13 DIAGNOSIS — M109 Gout, unspecified: Secondary | ICD-10-CM | POA: Diagnosis not present

## 2024-08-13 DIAGNOSIS — I69398 Other sequelae of cerebral infarction: Secondary | ICD-10-CM | POA: Diagnosis not present

## 2024-08-13 DIAGNOSIS — I129 Hypertensive chronic kidney disease with stage 1 through stage 4 chronic kidney disease, or unspecified chronic kidney disease: Secondary | ICD-10-CM | POA: Diagnosis not present

## 2024-08-13 DIAGNOSIS — E785 Hyperlipidemia, unspecified: Secondary | ICD-10-CM | POA: Diagnosis not present

## 2024-08-13 DIAGNOSIS — I480 Paroxysmal atrial fibrillation: Secondary | ICD-10-CM | POA: Diagnosis not present

## 2024-08-13 DIAGNOSIS — K5732 Diverticulitis of large intestine without perforation or abscess without bleeding: Secondary | ICD-10-CM | POA: Diagnosis not present

## 2024-08-13 DIAGNOSIS — H9191 Unspecified hearing loss, right ear: Secondary | ICD-10-CM | POA: Diagnosis not present

## 2024-08-13 DIAGNOSIS — N1831 Chronic kidney disease, stage 3a: Secondary | ICD-10-CM | POA: Diagnosis not present

## 2024-08-13 DIAGNOSIS — D631 Anemia in chronic kidney disease: Secondary | ICD-10-CM | POA: Diagnosis not present

## 2024-08-18 DIAGNOSIS — N1831 Chronic kidney disease, stage 3a: Secondary | ICD-10-CM | POA: Diagnosis not present

## 2024-08-18 DIAGNOSIS — I129 Hypertensive chronic kidney disease with stage 1 through stage 4 chronic kidney disease, or unspecified chronic kidney disease: Secondary | ICD-10-CM | POA: Diagnosis not present

## 2024-08-18 DIAGNOSIS — I69398 Other sequelae of cerebral infarction: Secondary | ICD-10-CM | POA: Diagnosis not present

## 2024-08-18 DIAGNOSIS — E785 Hyperlipidemia, unspecified: Secondary | ICD-10-CM | POA: Diagnosis not present

## 2024-08-18 DIAGNOSIS — H9191 Unspecified hearing loss, right ear: Secondary | ICD-10-CM | POA: Diagnosis not present

## 2024-08-18 DIAGNOSIS — I480 Paroxysmal atrial fibrillation: Secondary | ICD-10-CM | POA: Diagnosis not present

## 2024-08-18 DIAGNOSIS — K5732 Diverticulitis of large intestine without perforation or abscess without bleeding: Secondary | ICD-10-CM | POA: Diagnosis not present

## 2024-08-18 DIAGNOSIS — M109 Gout, unspecified: Secondary | ICD-10-CM | POA: Diagnosis not present

## 2024-08-22 DIAGNOSIS — H9191 Unspecified hearing loss, right ear: Secondary | ICD-10-CM | POA: Diagnosis not present

## 2024-08-24 DIAGNOSIS — I10 Essential (primary) hypertension: Secondary | ICD-10-CM | POA: Diagnosis not present

## 2024-08-24 DIAGNOSIS — E785 Hyperlipidemia, unspecified: Secondary | ICD-10-CM | POA: Diagnosis not present

## 2024-08-24 DIAGNOSIS — N1831 Chronic kidney disease, stage 3a: Secondary | ICD-10-CM | POA: Diagnosis not present

## 2024-08-24 DIAGNOSIS — I129 Hypertensive chronic kidney disease with stage 1 through stage 4 chronic kidney disease, or unspecified chronic kidney disease: Secondary | ICD-10-CM | POA: Diagnosis not present

## 2024-08-24 DIAGNOSIS — D631 Anemia in chronic kidney disease: Secondary | ICD-10-CM | POA: Diagnosis not present

## 2024-08-24 DIAGNOSIS — F321 Major depressive disorder, single episode, moderate: Secondary | ICD-10-CM | POA: Diagnosis not present

## 2024-08-24 DIAGNOSIS — J3489 Other specified disorders of nose and nasal sinuses: Secondary | ICD-10-CM | POA: Diagnosis not present

## 2024-08-24 DIAGNOSIS — I69398 Other sequelae of cerebral infarction: Secondary | ICD-10-CM | POA: Diagnosis not present

## 2024-08-24 DIAGNOSIS — M109 Gout, unspecified: Secondary | ICD-10-CM | POA: Diagnosis not present

## 2024-08-24 DIAGNOSIS — M8000XA Age-related osteoporosis with current pathological fracture, unspecified site, initial encounter for fracture: Secondary | ICD-10-CM | POA: Diagnosis not present

## 2024-08-24 DIAGNOSIS — G44211 Episodic tension-type headache, intractable: Secondary | ICD-10-CM | POA: Diagnosis not present

## 2024-08-24 DIAGNOSIS — H9191 Unspecified hearing loss, right ear: Secondary | ICD-10-CM | POA: Diagnosis not present

## 2024-08-24 DIAGNOSIS — I480 Paroxysmal atrial fibrillation: Secondary | ICD-10-CM | POA: Diagnosis not present

## 2024-08-24 DIAGNOSIS — F419 Anxiety disorder, unspecified: Secondary | ICD-10-CM | POA: Diagnosis not present

## 2024-08-24 DIAGNOSIS — R42 Dizziness and giddiness: Secondary | ICD-10-CM | POA: Diagnosis not present

## 2024-08-24 DIAGNOSIS — K5732 Diverticulitis of large intestine without perforation or abscess without bleeding: Secondary | ICD-10-CM | POA: Diagnosis not present

## 2024-08-25 DIAGNOSIS — H9191 Unspecified hearing loss, right ear: Secondary | ICD-10-CM | POA: Diagnosis not present

## 2024-08-25 DIAGNOSIS — I480 Paroxysmal atrial fibrillation: Secondary | ICD-10-CM | POA: Diagnosis not present

## 2024-08-25 DIAGNOSIS — D631 Anemia in chronic kidney disease: Secondary | ICD-10-CM | POA: Diagnosis not present

## 2024-08-25 DIAGNOSIS — I69398 Other sequelae of cerebral infarction: Secondary | ICD-10-CM | POA: Diagnosis not present

## 2024-08-25 DIAGNOSIS — M109 Gout, unspecified: Secondary | ICD-10-CM | POA: Diagnosis not present

## 2024-08-25 DIAGNOSIS — N1831 Chronic kidney disease, stage 3a: Secondary | ICD-10-CM | POA: Diagnosis not present

## 2024-08-25 DIAGNOSIS — K5732 Diverticulitis of large intestine without perforation or abscess without bleeding: Secondary | ICD-10-CM | POA: Diagnosis not present

## 2024-08-25 DIAGNOSIS — E785 Hyperlipidemia, unspecified: Secondary | ICD-10-CM | POA: Diagnosis not present

## 2024-08-30 ENCOUNTER — Telehealth (HOSPITAL_COMMUNITY): Payer: Self-pay | Admitting: Pharmacy Technician

## 2024-08-30 ENCOUNTER — Other Ambulatory Visit (HOSPITAL_COMMUNITY): Payer: Self-pay | Admitting: Internal Medicine

## 2024-08-30 DIAGNOSIS — M810 Age-related osteoporosis without current pathological fracture: Secondary | ICD-10-CM | POA: Insufficient documentation

## 2024-08-30 NOTE — Telephone Encounter (Signed)
 Auth Submission: PENDING Site of care: [facility] Payer: [employer] MEDICARE (THEY HAVE FIRST NAME AS JOANNE, NOT Rodneshia) Medication & CPT/J Code(s) submitted: Prolia  (Denosumab ) [id number] Diagnosis Code: M81.0 Route of submission (phone, fax, portal): CMM KEY: [id number] Phone # Fax # Auth type: Buy/Bill HB Units/visits requested: 60mg  x 2 doses, q 6 months Reference number: *** Approval from: *** to ***    Dagoberto Armour, CPhT [facility] Phone: [phone] 08/30/2024

## 2024-08-31 DIAGNOSIS — I129 Hypertensive chronic kidney disease with stage 1 through stage 4 chronic kidney disease, or unspecified chronic kidney disease: Secondary | ICD-10-CM | POA: Diagnosis not present

## 2024-08-31 DIAGNOSIS — D631 Anemia in chronic kidney disease: Secondary | ICD-10-CM | POA: Diagnosis not present

## 2024-08-31 DIAGNOSIS — I480 Paroxysmal atrial fibrillation: Secondary | ICD-10-CM | POA: Diagnosis not present

## 2024-08-31 DIAGNOSIS — N1831 Chronic kidney disease, stage 3a: Secondary | ICD-10-CM | POA: Diagnosis not present

## 2024-08-31 DIAGNOSIS — M109 Gout, unspecified: Secondary | ICD-10-CM | POA: Diagnosis not present

## 2024-08-31 DIAGNOSIS — I69398 Other sequelae of cerebral infarction: Secondary | ICD-10-CM | POA: Diagnosis not present

## 2024-08-31 DIAGNOSIS — E785 Hyperlipidemia, unspecified: Secondary | ICD-10-CM | POA: Diagnosis not present

## 2024-08-31 DIAGNOSIS — H9191 Unspecified hearing loss, right ear: Secondary | ICD-10-CM | POA: Diagnosis not present

## 2024-08-31 DIAGNOSIS — K5732 Diverticulitis of large intestine without perforation or abscess without bleeding: Secondary | ICD-10-CM | POA: Diagnosis not present

## 2024-09-02 DIAGNOSIS — I10 Essential (primary) hypertension: Secondary | ICD-10-CM | POA: Diagnosis not present

## 2024-09-02 DIAGNOSIS — J309 Allergic rhinitis, unspecified: Secondary | ICD-10-CM | POA: Diagnosis not present

## 2024-09-02 DIAGNOSIS — I480 Paroxysmal atrial fibrillation: Secondary | ICD-10-CM | POA: Diagnosis not present

## 2024-09-02 DIAGNOSIS — F419 Anxiety disorder, unspecified: Secondary | ICD-10-CM | POA: Diagnosis not present

## 2024-09-05 DIAGNOSIS — E785 Hyperlipidemia, unspecified: Secondary | ICD-10-CM | POA: Diagnosis not present

## 2024-09-05 DIAGNOSIS — I69398 Other sequelae of cerebral infarction: Secondary | ICD-10-CM | POA: Diagnosis not present

## 2024-09-05 DIAGNOSIS — N1831 Chronic kidney disease, stage 3a: Secondary | ICD-10-CM | POA: Diagnosis not present

## 2024-09-05 DIAGNOSIS — I480 Paroxysmal atrial fibrillation: Secondary | ICD-10-CM | POA: Diagnosis not present

## 2024-09-05 DIAGNOSIS — I129 Hypertensive chronic kidney disease with stage 1 through stage 4 chronic kidney disease, or unspecified chronic kidney disease: Secondary | ICD-10-CM | POA: Diagnosis not present

## 2024-09-05 DIAGNOSIS — H9191 Unspecified hearing loss, right ear: Secondary | ICD-10-CM | POA: Diagnosis not present

## 2024-09-05 DIAGNOSIS — K5732 Diverticulitis of large intestine without perforation or abscess without bleeding: Secondary | ICD-10-CM | POA: Diagnosis not present

## 2024-09-05 DIAGNOSIS — M109 Gout, unspecified: Secondary | ICD-10-CM | POA: Diagnosis not present

## 2024-09-07 DIAGNOSIS — K5732 Diverticulitis of large intestine without perforation or abscess without bleeding: Secondary | ICD-10-CM | POA: Diagnosis not present

## 2024-09-07 DIAGNOSIS — I69398 Other sequelae of cerebral infarction: Secondary | ICD-10-CM | POA: Diagnosis not present

## 2024-09-07 DIAGNOSIS — H9191 Unspecified hearing loss, right ear: Secondary | ICD-10-CM | POA: Diagnosis not present

## 2024-09-07 DIAGNOSIS — I129 Hypertensive chronic kidney disease with stage 1 through stage 4 chronic kidney disease, or unspecified chronic kidney disease: Secondary | ICD-10-CM | POA: Diagnosis not present

## 2024-09-07 DIAGNOSIS — E785 Hyperlipidemia, unspecified: Secondary | ICD-10-CM | POA: Diagnosis not present

## 2024-09-07 DIAGNOSIS — M109 Gout, unspecified: Secondary | ICD-10-CM | POA: Diagnosis not present

## 2024-09-07 DIAGNOSIS — N1831 Chronic kidney disease, stage 3a: Secondary | ICD-10-CM | POA: Diagnosis not present

## 2024-09-07 DIAGNOSIS — D631 Anemia in chronic kidney disease: Secondary | ICD-10-CM | POA: Diagnosis not present

## 2024-09-07 DIAGNOSIS — I480 Paroxysmal atrial fibrillation: Secondary | ICD-10-CM | POA: Diagnosis not present

## 2024-09-15 DIAGNOSIS — N1831 Chronic kidney disease, stage 3a: Secondary | ICD-10-CM | POA: Diagnosis not present

## 2024-09-15 DIAGNOSIS — I69398 Other sequelae of cerebral infarction: Secondary | ICD-10-CM | POA: Diagnosis not present

## 2024-09-15 DIAGNOSIS — D631 Anemia in chronic kidney disease: Secondary | ICD-10-CM | POA: Diagnosis not present

## 2024-09-15 DIAGNOSIS — M109 Gout, unspecified: Secondary | ICD-10-CM | POA: Diagnosis not present

## 2024-09-15 DIAGNOSIS — K5732 Diverticulitis of large intestine without perforation or abscess without bleeding: Secondary | ICD-10-CM | POA: Diagnosis not present

## 2024-09-15 DIAGNOSIS — I129 Hypertensive chronic kidney disease with stage 1 through stage 4 chronic kidney disease, or unspecified chronic kidney disease: Secondary | ICD-10-CM | POA: Diagnosis not present

## 2024-09-15 DIAGNOSIS — F32A Depression, unspecified: Secondary | ICD-10-CM | POA: Diagnosis not present

## 2024-09-15 DIAGNOSIS — I480 Paroxysmal atrial fibrillation: Secondary | ICD-10-CM | POA: Diagnosis not present

## 2024-09-20 ENCOUNTER — Ambulatory Visit (HOSPITAL_COMMUNITY)
Admission: RE | Admit: 2024-09-20 | Payer: Medicare PPO | Source: Ambulatory Visit | Attending: Cardiology | Admitting: Cardiology

## 2024-09-28 ENCOUNTER — Encounter (HOSPITAL_COMMUNITY): Payer: Self-pay

## 2024-09-28 ENCOUNTER — Emergency Department (HOSPITAL_COMMUNITY)

## 2024-09-28 ENCOUNTER — Other Ambulatory Visit: Payer: Self-pay

## 2024-09-28 ENCOUNTER — Observation Stay (HOSPITAL_COMMUNITY)
Admission: EM | Admit: 2024-09-28 | Discharge: 2024-09-29 | Disposition: A | Discharge status: Home / Self Care | Attending: Family Medicine | Admitting: Family Medicine

## 2024-09-28 DIAGNOSIS — D509 Iron deficiency anemia, unspecified: Secondary | ICD-10-CM | POA: Diagnosis not present

## 2024-09-28 DIAGNOSIS — E785 Hyperlipidemia, unspecified: Secondary | ICD-10-CM | POA: Diagnosis present

## 2024-09-28 DIAGNOSIS — I130 Hypertensive heart and chronic kidney disease with heart failure and stage 1 through stage 4 chronic kidney disease, or unspecified chronic kidney disease: Secondary | ICD-10-CM | POA: Diagnosis present

## 2024-09-28 DIAGNOSIS — D631 Anemia in chronic kidney disease: Secondary | ICD-10-CM | POA: Diagnosis not present

## 2024-09-28 DIAGNOSIS — M109 Gout, unspecified: Secondary | ICD-10-CM | POA: Diagnosis not present

## 2024-09-28 DIAGNOSIS — Z7901 Long term (current) use of anticoagulants: Secondary | ICD-10-CM | POA: Insufficient documentation

## 2024-09-28 DIAGNOSIS — R4701 Aphasia: Secondary | ICD-10-CM | POA: Diagnosis present

## 2024-09-28 DIAGNOSIS — Z853 Personal history of malignant neoplasm of breast: Secondary | ICD-10-CM | POA: Diagnosis not present

## 2024-09-28 DIAGNOSIS — N1831 Chronic kidney disease, stage 3a: Secondary | ICD-10-CM | POA: Diagnosis not present

## 2024-09-28 DIAGNOSIS — M810 Age-related osteoporosis without current pathological fracture: Secondary | ICD-10-CM | POA: Diagnosis not present

## 2024-09-28 DIAGNOSIS — Z96642 Presence of left artificial hip joint: Secondary | ICD-10-CM | POA: Insufficient documentation

## 2024-09-28 DIAGNOSIS — Z96611 Presence of right artificial shoulder joint: Secondary | ICD-10-CM | POA: Insufficient documentation

## 2024-09-28 DIAGNOSIS — F419 Anxiety disorder, unspecified: Secondary | ICD-10-CM | POA: Diagnosis not present

## 2024-09-28 DIAGNOSIS — E559 Vitamin D deficiency, unspecified: Secondary | ICD-10-CM | POA: Diagnosis not present

## 2024-09-28 DIAGNOSIS — Z79899 Other long term (current) drug therapy: Secondary | ICD-10-CM | POA: Diagnosis not present

## 2024-09-28 DIAGNOSIS — D638 Anemia in other chronic diseases classified elsewhere: Secondary | ICD-10-CM | POA: Diagnosis present

## 2024-09-28 DIAGNOSIS — R7303 Prediabetes: Secondary | ICD-10-CM | POA: Diagnosis not present

## 2024-09-28 DIAGNOSIS — I509 Heart failure, unspecified: Secondary | ICD-10-CM | POA: Insufficient documentation

## 2024-09-28 DIAGNOSIS — Z96653 Presence of artificial knee joint, bilateral: Secondary | ICD-10-CM | POA: Insufficient documentation

## 2024-09-28 DIAGNOSIS — I10 Essential (primary) hypertension: Secondary | ICD-10-CM | POA: Diagnosis present

## 2024-09-28 DIAGNOSIS — D0512 Intraductal carcinoma in situ of left breast: Secondary | ICD-10-CM | POA: Diagnosis present

## 2024-09-28 LAB — I-STAT CHEM 8, ED
BUN: 15 mg/dL (ref 8–23)
Calcium, Ion: 1.18 mmol/L (ref 1.15–1.40)
Chloride: 102 mmol/L (ref 98–111)
Creatinine, Ser: 0.6 mg/dL (ref 0.44–1.00)
Glucose, Bld: 112 mg/dL — ABNORMAL HIGH (ref 70–99)
HCT: 40 % (ref 36.0–46.0)
Hemoglobin: 13.6 g/dL (ref 12.0–15.0)
Potassium: 4.9 mmol/L (ref 3.5–5.1)
Sodium: 136 mmol/L (ref 135–145)
TCO2: 25 mmol/L (ref 22–32)

## 2024-09-28 LAB — MAGNESIUM: Magnesium: 1.7 mg/dL (ref 1.7–2.4)

## 2024-09-28 LAB — COMPREHENSIVE METABOLIC PANEL WITH GFR
ALT: 23 U/L (ref 0–44)
AST: 43 U/L — ABNORMAL HIGH (ref 15–41)
Albumin: 4.1 g/dL (ref 3.5–5.0)
Alkaline Phosphatase: 100 U/L (ref 38–126)
Anion gap: 7 (ref 5–15)
BUN: 11 mg/dL (ref 8–23)
CO2: 27 mmol/L (ref 22–32)
Calcium: 9.4 mg/dL (ref 8.9–10.3)
Chloride: 101 mmol/L (ref 98–111)
Creatinine, Ser: 0.61 mg/dL (ref 0.44–1.00)
GFR, Estimated: >60 mL/min (ref >60)
Glucose, Bld: 112 mg/dL — ABNORMAL HIGH (ref 70–99)
Potassium: 4.3 mmol/L (ref 3.5–5.1)
Sodium: 135 mmol/L (ref 135–145)
Total Bilirubin: 1.5 mg/dL — ABNORMAL HIGH (ref 0.0–1.2)
Total Protein: 7.2 g/dL (ref 6.5–8.1)

## 2024-09-28 LAB — LIPID PANEL
Cholesterol: 150 mg/dL (ref 0–200)
HDL: 65 mg/dL (ref >40)
LDL Cholesterol: 68 mg/dL (ref 0–99)
Total CHOL/HDL Ratio: 2.3 ratio
Triglycerides: 84 mg/dL (ref <150)
VLDL: 17 mg/dL (ref 0–40)

## 2024-09-28 LAB — DIFFERENTIAL
Abs Immature Granulocytes: 0.04 K/uL (ref 0.00–0.07)
Basophils Absolute: 0 K/uL (ref 0.0–0.1)
Basophils Relative: 0 %
Eosinophils Absolute: 0.1 K/uL (ref 0.0–0.5)
Eosinophils Relative: 1 %
Immature Granulocytes: 1 %
Lymphocytes Relative: 18 %
Lymphs Abs: 1.3 K/uL (ref 0.7–4.0)
Monocytes Absolute: 0.6 K/uL (ref 0.1–1.0)
Monocytes Relative: 8 %
Neutro Abs: 5.4 K/uL (ref 1.7–7.7)
Neutrophils Relative %: 72 %

## 2024-09-28 LAB — ETHANOL: Alcohol, Ethyl (B): <15 mg/dL (ref <15)

## 2024-09-28 LAB — URINALYSIS, ROUTINE W REFLEX MICROSCOPIC
Bilirubin Urine: NEGATIVE
Glucose, UA: NEGATIVE mg/dL
Ketones, ur: NEGATIVE mg/dL
Leukocytes,Ua: NEGATIVE
Nitrite: NEGATIVE
Protein, ur: NEGATIVE mg/dL
Specific Gravity, Urine: <1.005 — ABNORMAL LOW (ref 1.005–1.030)
pH: 6 (ref 5.0–8.0)

## 2024-09-28 LAB — HEMOGLOBIN A1C
Hgb A1c MFr Bld: 5.5 % (ref 4.8–5.6)
Mean Plasma Glucose: 111.15 mg/dL

## 2024-09-28 LAB — URINALYSIS, MICROSCOPIC (REFLEX): Bacteria, UA: NONE SEEN

## 2024-09-28 LAB — CBC
HCT: 39.6 % (ref 36.0–46.0)
Hemoglobin: 13 g/dL (ref 12.0–15.0)
MCH: 31.4 pg (ref 26.0–34.0)
MCHC: 32.8 g/dL (ref 30.0–36.0)
MCV: 95.7 fL (ref 80.0–100.0)
Platelets: 205 K/uL (ref 150–400)
RBC: 4.14 MIL/uL (ref 3.87–5.11)
RDW: 14.6 % (ref 11.5–15.5)
WBC: 7.4 K/uL (ref 4.0–10.5)
nRBC: 0 % (ref 0.0–0.2)

## 2024-09-28 LAB — TSH: TSH: 3.566 u[IU]/mL (ref 0.350–4.500)

## 2024-09-28 LAB — PHOSPHORUS: Phosphorus: 2.7 mg/dL (ref 2.5–4.6)

## 2024-09-28 LAB — APTT: aPTT: 32 s (ref 24–36)

## 2024-09-28 LAB — PROTIME-INR
INR: 1.4 — ABNORMAL HIGH (ref 0.8–1.2)
Prothrombin Time: 18.2 s — ABNORMAL HIGH (ref 11.4–15.2)

## 2024-09-28 MED ORDER — MELATONIN 5 MG PO TABS
5.0000 mg | ORAL_TABLET | Freq: Every day | ORAL | Status: DC
  Administered 2024-09-28: 5 mg via ORAL
  Filled 2024-09-28: qty 1

## 2024-09-28 MED ORDER — SODIUM CHLORIDE 0.9% FLUSH: 3.0000 mL | Freq: Two times a day (BID) | INTRAVENOUS | Status: DC

## 2024-09-28 MED ORDER — FLEET ENEMA RE ENEM: 1.0000 | ENEMA | Freq: Once | RECTAL | Status: DC | PRN

## 2024-09-28 MED ORDER — TRAZODONE HCL 50 MG PO TABS: 25.0000 mg | ORAL_TABLET | Freq: Every evening | ORAL | Status: DC | PRN

## 2024-09-28 MED ORDER — BISACODYL 5 MG PO TBEC: 5.0000 mg | DELAYED_RELEASE_TABLET | Freq: Every day | ORAL | Status: DC | PRN

## 2024-09-28 MED ORDER — SODIUM CHLORIDE 0.9% FLUSH
3.0000 mL | Freq: Once | INTRAVENOUS | Status: AC
  Administered 2024-09-28: 3 mL via INTRAVENOUS

## 2024-09-28 MED ORDER — ONDANSETRON HCL 4 MG/2ML IJ SOLN: 4.0000 mg | Freq: Four times a day (QID) | INTRAMUSCULAR | Status: DC | PRN

## 2024-09-28 MED ORDER — ACETAMINOPHEN 325 MG PO TABS
650.0000 mg | ORAL_TABLET | Freq: Four times a day (QID) | ORAL | Status: DC | PRN
  Administered 2024-09-28 – 2024-09-29 (×3): 650 mg via ORAL
  Filled 2024-09-28 (×3): qty 2

## 2024-09-28 MED ORDER — ATENOLOL 25 MG PO TABS
50.0000 mg | ORAL_TABLET | Freq: Two times a day (BID) | ORAL | Status: DC
  Administered 2024-09-28 – 2024-09-29 (×2): 50 mg via ORAL
  Filled 2024-09-28 (×2): qty 2

## 2024-09-28 MED ORDER — ALPRAZOLAM 0.25 MG PO TABS: 0.5000 mg | ORAL_TABLET | Freq: Three times a day (TID) | ORAL | Status: DC | PRN

## 2024-09-28 MED ORDER — ASPIRIN 81 MG PO TBEC: 81.0000 mg | DELAYED_RELEASE_TABLET | Freq: Every day | ORAL | Status: DC

## 2024-09-28 MED ORDER — OXYCODONE HCL 5 MG PO TABS: 5.0000 mg | ORAL_TABLET | ORAL | Status: DC | PRN

## 2024-09-28 MED ORDER — VITAMIN D 25 MCG (1000 UNIT) PO TABS
2000.0000 [IU] | ORAL_TABLET | Freq: Every morning | ORAL | Status: DC
  Administered 2024-09-29: 2000 [IU] via ORAL
  Filled 2024-09-28 (×2): qty 2

## 2024-09-28 MED ORDER — ACETAMINOPHEN 650 MG RE SUPP: 650.0000 mg | Freq: Four times a day (QID) | RECTAL | Status: DC | PRN

## 2024-09-28 MED ORDER — VENLAFAXINE HCL ER 37.5 MG PO CP24
37.5000 mg | ORAL_CAPSULE | Freq: Every day | ORAL | Status: DC
  Administered 2024-09-29: 37.5 mg via ORAL
  Filled 2024-09-28: qty 1

## 2024-09-28 MED ORDER — SENNOSIDES-DOCUSATE SODIUM 8.6-50 MG PO TABS: 1.0000 | ORAL_TABLET | Freq: Every evening | ORAL | Status: DC | PRN

## 2024-09-28 MED ORDER — SODIUM CHLORIDE 0.9 % IV SOLN: INTRAVENOUS | Status: DC

## 2024-09-28 MED ORDER — HEPARIN SODIUM (PORCINE) 5000 UNIT/ML IJ SOLN: 5000.0000 [IU] | Freq: Three times a day (TID) | INTRAMUSCULAR | Status: DC

## 2024-09-28 MED ORDER — ATORVASTATIN CALCIUM 40 MG PO TABS: 80.0000 mg | ORAL_TABLET | Freq: Every day | ORAL | Status: DC

## 2024-09-28 MED ORDER — IOHEXOL 350 MG/ML SOLN
75.0000 mL | Freq: Once | INTRAVENOUS | Status: AC | PRN
  Administered 2024-09-28: 75 mL via INTRAVENOUS

## 2024-09-28 MED ORDER — CLOPIDOGREL BISULFATE 75 MG PO TABS: 75.0000 mg | ORAL_TABLET | Freq: Every day | ORAL | Status: DC

## 2024-09-28 MED ORDER — ACETAMINOPHEN 10 MG/ML IV SOLN
1000.0000 mg | Freq: Once | INTRAVENOUS | Status: AC
  Administered 2024-09-28: 1000 mg via INTRAVENOUS
  Filled 2024-09-28: qty 100

## 2024-09-28 MED ORDER — APIXABAN 5 MG PO TABS
5.0000 mg | ORAL_TABLET | Freq: Two times a day (BID) | ORAL | Status: DC
  Administered 2024-09-28 – 2024-09-29 (×2): 5 mg via ORAL
  Filled 2024-09-28 (×2): qty 1

## 2024-09-28 MED ORDER — HYDRALAZINE HCL 20 MG/ML IJ SOLN
10.0000 mg | INTRAMUSCULAR | Status: DC | PRN
  Administered 2024-09-29: 10 mg via INTRAVENOUS
  Filled 2024-09-28: qty 1

## 2024-09-28 MED ORDER — ONDANSETRON HCL 4 MG PO TABS: 4.0000 mg | ORAL_TABLET | Freq: Four times a day (QID) | ORAL | Status: DC | PRN

## 2024-09-28 MED ORDER — IPRATROPIUM BROMIDE 0.02 % IN SOLN: 0.5000 mg | Freq: Four times a day (QID) | RESPIRATORY_TRACT | Status: DC | PRN

## 2024-09-28 MED ORDER — SODIUM CHLORIDE 0.9% FLUSH
3.0000 mL | Freq: Two times a day (BID) | INTRAVENOUS | Status: DC
  Administered 2024-09-29: 3 mL via INTRAVENOUS

## 2024-09-28 MED ORDER — ROSUVASTATIN CALCIUM 5 MG PO TABS
5.0000 mg | ORAL_TABLET | Freq: Every day | ORAL | Status: DC
  Administered 2024-09-29: 5 mg via ORAL
  Filled 2024-09-28: qty 1

## 2024-09-28 MED ORDER — ENSURE PLUS HIGH PROTEIN PO LIQD
237.0000 mL | Freq: Two times a day (BID) | ORAL | Status: DC
  Administered 2024-09-29: 237 mL via ORAL

## 2024-09-28 MED ORDER — FERROUS SULFATE 325 (65 FE) MG PO TABS
325.0000 mg | ORAL_TABLET | Freq: Every day | ORAL | Status: DC
  Administered 2024-09-29: 325 mg via ORAL
  Filled 2024-09-28: qty 1

## 2024-09-28 NOTE — ED Provider Triage Note (Signed)
 Emergency Medicine Provider Triage Evaluation Note  Kathryn Ayala , a 89 y.o. female  was evaluated in triage.  Pt complains of headache and aphasia.  Last normal was last night before going to bed.  Patient states she woke up with headache in the middle of the night with aphasia.  Denies any weakness.  She does report that she fell 2 days ago.  Is on Eliquis .  Denied any initial symptoms.  Review of Systems  Positive: See above Negative: Weakness  Physical Exam  BP (!) 185/111 (BP Location: Left Arm)   Pulse 92   Temp 98.4 F (36.9 C)   Resp 18   Ht 5' 3 (1.6 m)   Wt 68 kg   SpO2 98%   BMI 26.57 kg/m  Gen:   Awake, no distress   Resp:  Normal effort  MSK:   Moves extremities without difficulty  Other:  Expressive aphasia noted, cranial nerves intact, equal strength and sensation throughout the bilateral upper and lower extremities  Medical Decision Making  Medically screening exam initiated at 1:02 PM.  Appropriate orders placed.  [name] was informed that the remainder of the evaluation will be completed by another provider, this initial triage assessment does not replace that evaluation, and the importance of remaining in the ED until their evaluation is complete.  I did call over to CT scan to expedite her CT scans since she did report a fall 2 days ago and is on blood thinners.  Will obtain CT angiogram as well to evaluate for occlusion as well as subarachnoid hemorrhage although given description of symptoms suspicion for this is relatively low.  I did call over to CT scan to expedite her scans.  I do think that it is okay to proceed with CT angiogram prior to labs coming back as her most recent renal function was within normal limits.   Ula Prentice SAUNDERS, MD 09/28/24 [phone]

## 2024-09-28 NOTE — Assessment & Plan Note (Signed)
-   Continue vitamin supplements

## 2024-09-28 NOTE — Assessment & Plan Note (Signed)
 Anemia of chronic disease with iron deficiency and CKD -Continue ferrous sulfate , H&H stable

## 2024-09-28 NOTE — ED Provider Notes (Signed)
 [place] EMERGENCY DEPARTMENT AT [facility] Provider Note   CSN: [id number] Arrival date & time: 09/28/24  1228     Patient presents with: Aphasia   John Vasconcelos is a 89 y.o. female.   89 year old female with past medical history of atrial fibrillation on Eliquis  as well as hypertension and hyperlipidemia presenting to the emergency department today with headache and aphasia.  The patient states that she woke up this morning with headache.  States that it is moderate to severe in intensity.  She states that she tried talking this morning that she was having some aphasia.  Reports she is also felt that she was having a hard time getting her words out when she woke up this morning at 1 AM.  The patient's last known normal was before she went to bed last night.  She denies any focal weakness, numbness, or tingling.  The patient does report a fall a few days ago and did hit her head at that time.  She was not evaluated after.        Prior to Admission medications   Medication Sig Start Date End Date Taking? Authorizing Provider  acetaminophen  (TYLENOL ) 500 MG tablet Take 1,000 mg by mouth every 6 (six) hours as needed for moderate pain.    [provider]  allopurinol  (ZYLOPRIM ) 100 MG tablet Take 100 mg by mouth 2 (two) times daily.  03/04/16   [provider]  apixaban  (ELIQUIS ) 5 MG TABS tablet Take 1 tablet (5 mg total) by mouth 2 (two) times daily. 07/30/24   Barbarann Nest, MD  atenolol  (TENORMIN ) 50 MG tablet Take 50 mg by mouth 2 (two) times daily. 05/05/16   [provider]  B Complex-C (B-COMPLEX WITH VITAMIN C) tablet Take 1 tablet by mouth daily. 11/12/22   Patel, Pranav M, MD  Biotin (BIOTIN 5000) 5 MG CAPS Take 5 mg by mouth in the morning.    [provider]  Cholecalciferol (VITAMIN D3) 50 MCG (2000 UT) CAPS Take 2,000 Units by mouth in the morning.    [provider]  feeding supplement (ENSURE PLUS HIGH PROTEIN) LIQD  Take 237 mLs by mouth 2 (two) times daily between meals. 07/30/24   Barbarann Nest, MD  ferrous sulfate  325 (65 FE) MG tablet Take 1 tablet (325 mg total) by mouth daily with breakfast. 11/11/22 10/06/25  Tobie Yetta HERO, MD  hydrALAZINE  (APRESOLINE ) 10 MG tablet Take 0.5 tablets (5 mg total) by mouth daily as needed (SBP > 150). 08/11/24   Vann, Jessica U, DO  loratadine  (CLARITIN ) 10 MG tablet Take 10 mg by mouth in the morning.    [provider]  Melatonin 10 MG TABS Take 10 mg by mouth at bedtime.    [provider]  rosuvastatin  (CRESTOR ) 5 MG tablet Take 5 mg by mouth in the morning.    [provider]  saccharomyces boulardii (FLORASTOR) 250 MG capsule Take 1 capsule (250 mg total) by mouth 2 (two) times daily. 08/11/24   Vann, Jessica U, DO  traMADol  (ULTRAM ) 50 MG tablet Take 1 tablet (50 mg total) by mouth every 6 (six) hours as needed for moderate pain (pain score 4-6) or severe pain (pain score 7-10). Patient not taking: Reported on 08/06/2024 10/23/23 10/22/24  Kay Kemps, MD  venlafaxine  XR (EFFEXOR -XR) 37.5 MG 24 hr capsule Take 37.5 mg by mouth in the morning.    [provider]    Allergies: Hydrocodone , Penicillins, Azithromycin, Codeine, Levofloxacin, and Sulfa  antibiotics    Review of Systems  Neurological:  Positive for speech difficulty and headaches.  All other systems reviewed and are negative.   Updated Vital Signs BP (!) 185/111 (BP Location: Left Arm)   Pulse 92   Temp 98.4 F (36.9 C)   Resp 18   Ht 5' 3 (1.6 m)   Wt 68 kg   SpO2 100%   BMI 26.57 kg/m   Physical Exam Vitals and nursing note reviewed.   Gen: NAD Eyes: PERRL, EOMI HEENT: no oropharyngeal swelling Neck: trachea midline Resp: clear to auscultation bilaterally Card: RRR, no murmurs, rubs, or gallops Abd: nontender, nondistended Extremities: no calf tenderness, no edema Vascular: 2+ radial pulses bilaterally, 2+ DP pulses bilaterally Neuro: NIH  stroke scale of 1 for expressive aphasia that is mild Skin: no rashes Psyc: acting appropriately   (all labs ordered are listed, but only abnormal results are displayed) Labs Reviewed  I-STAT CHEM 8, ED - Abnormal; Notable for the following components:      Result Value   Glucose, Bld 112 (*)    All other components within normal limits  CBC  DIFFERENTIAL  PROTIME-INR  APTT  COMPREHENSIVE METABOLIC PANEL WITH GFR  ETHANOL  CBG MONITORING, ED    EKG: EKG Interpretation Date/Time:  Wednesday September 28 2024 12:57:35 EST Ventricular Rate:  80 PR Interval:    QRS Duration:  68 QT Interval:  384 QTC Calculation: 442 R Axis:   97  Text Interpretation: Atrial fibrillation Rightward axis Cannot rule out Anterior infarct , age undetermined no stemi Abnormal ECG When compared with ECG of 28-Sep-2024 12:56, PREVIOUS ECG IS PRESENT Confirmed by Ula Barter [phone]) on 09/28/2024 1:16:32 PM  Radiology: CT ANGIO HEAD NECK W WO CM Result Date: 09/28/2024 EXAM: CTA HEAD AND NECK WITH AND WITHOUT 09/28/2024 02:52:00 PM TECHNIQUE: CTA of the head and neck was performed with and without the administration of 75 mL of iohexol  (OMNIPAQUE ) 350 MG/ML injection. Multiplanar 2D and/or 3D reformatted images are provided for review. Automated exposure control, iterative reconstruction, and/or weight based adjustment of the mA/kV was utilized to reduce the radiation dose to as low as reasonably achievable. Stenosis of the internal carotid arteries measured using NASCET criteria. COMPARISON: None available CLINICAL HISTORY: Headache, sudden, severe. Aphasia. FINDINGS: CTA NECK: AORTIC ARCH AND ARCH VESSELS: Normal variant aortic arch branching pattern with common origin of the brachiocephalic and left common carotid arteries. Mild atherosclerosis in the aortic arch. No dissection or arterial injury. No significant stenosis of the brachiocephalic or subclavian arteries. CERVICAL CAROTID ARTERIES: Small amount  of calcified plaque at the left carotid bifurcation and in the right carotid bulb. No dissection, arterial injury, or hemodynamically significant stenosis by NASCET criteria. CERVICAL VERTEBRAL ARTERIES: Strongly dominant right and diffusely hypoplastic left vertebral arteries. No dissection, arterial injury, or significant stenosis. LUNGS AND MEDIASTINUM: Biapical pleural parenchymal lung scarring. SOFT TISSUES: No acute abnormality. BONES: Focally advanced disc degeneration at C6-C7. Advanced multilevel cervical facet arthrosis. CTA HEAD: ANTERIOR CIRCULATION: The intracranial internal carotid arteries are patent with mild atherosclerosis not resulting in a significant stenosis. The right A1 segment is severely hypoplastic with the right ACA predominantly supplied via the anterior communicating artery. ACAs and MCAs are patent without evidence of a proximal branch occlusion or significant proximal stenosis. No aneurysm. POSTERIOR CIRCULATION: The intracranial right vertebral artery is patent and supplies the basilar artery while the left vertebral artery is hypoplastic and terminates in PICA. The basilar artery is widely patent. There are  large right and diminutive or absent left posterior communicating arteries with hypoplasia of the right P1 segment. Both PCAs are patent without evidence of a significant proximal stenosis. No aneurysm. OTHER: No dural venous sinus thrombosis on this non-dedicated study. IMPRESSION: 1. Mild atherosclerosis without a large vessel occlusion or significant proximal stenosis in the head or neck. Electronically signed by: Dasie Hamburg MD 09/28/2024 03:05 PM EST RP Workstation: [id number]   CT HEAD WO CONTRAST Result Date: 09/28/2024 CLINICAL DATA:  Aphasia, acute neuro deficit EXAM: CT HEAD WITHOUT CONTRAST TECHNIQUE: Contiguous axial images were obtained from the base of the skull through the vertex without intravenous contrast. RADIATION DOSE REDUCTION: This exam was performed  according to the departmental dose-optimization program which includes automated exposure control, adjustment of the mA and/or kV according to patient size and/or use of iterative reconstruction technique. COMPARISON:  07/27/2024 FINDINGS: Brain: Stable mild atrophy pattern without acute intracranial hemorrhage, mass lesion, definite acute infarction, midline shift, herniation, hydrocephalus, or extra-axial fluid collection. No focal mass effect or edema. Cisterns are patent. Minor cerebellar atrophy as well. Vascular: No hyperdense vessel or unexpected calcification. Skull: Normal. Negative for fracture or focal lesion. Sinuses/Orbits: Orbits are symmetric and unremarkable. Mastoids are clear. Chronic left maxillary sinus opacification with thickened maxillary walls noted. Remainder of the sinuses are clear. Other: None. IMPRESSION: 1. Stable atrophy pattern. No acute intracranial abnormality by noncontrast CT. 2. Chronic left maxillary sinus disease. Electronically Signed   By: CHRISTELLA.  Shick M.D.   On: 09/28/2024 13:49     Procedures   Medications Ordered in the ED  sodium chloride  flush (NS) 0.9 % injection 3 mL (3 mLs Intravenous Given 09/28/24 1505)  acetaminophen  (OFIRMEV ) IV 1,000 mg (0 mg Intravenous Stopped 09/28/24 1526)  iohexol  (OMNIPAQUE ) 350 MG/ML injection 75 mL (75 mLs Intravenous Contrast Given 09/28/24 1451)                                    Medical Decision Making 89 year old female with past medical history of atrial fibrillation on Eliquis , hypertension, and hyperlipidemia presenting to the emergency department today with expressive aphasia and headache.  I will further evaluate the patient here with a CT angiogram of her head and neck and noncontrast CT scan to evaluate for intracranial hemorrhage or mass lesion as well as aneurysm or CVA.  The patient is outside the window for thrombolytics at this time and does not have findings on exam consistent with large vessel occlusion that  would warrant thrombectomy.  Will allow for permissive hypertension and give the patient IV Tylenol  for headache.  I will reevaluate for ultimate disposition.  I did call over to CT scan to expedite her CT scan because she does report a fall 2 days ago and is on Eliquis  to evaluate for intracranial hemorrhage from her recent fall.  The patient's labs are largely unremarkable.  CT scan did not show any acute findings.  She will be admitted for further evaluation for CVA.  MRI is ordered here in the emergency department.  CRITICAL CARE Performed by: Prentice JONELLE Medicus   Total critical care time: 35 minutes  Critical care time was exclusive of separately billable procedures and treating other patients.  Critical care was necessary to treat or prevent imminent or life-threatening deterioration.  Critical care was time spent personally by me on the following activities: development of treatment plan with patient and/or surrogate as well as nursing,  discussions with consultants, evaluation of patient's response to treatment, examination of patient, obtaining history from patient or surrogate, ordering and performing treatments and interventions, ordering and review of laboratory studies, ordering and review of radiographic studies, pulse oximetry and re-evaluation of patient's condition.   Amount and/or Complexity of Data Reviewed Labs: ordered. Radiology: ordered.  Risk Prescription drug management. Decision regarding hospitalization.        Final diagnoses:  Expressive aphasia    ED Discharge Orders     None          Ula Prentice SAUNDERS, MD 09/28/24 1546

## 2024-09-28 NOTE — Assessment & Plan Note (Signed)
 BUN/creatinine at baseline - Avoiding hypotension, nephrotoxins  Lab Results  Component Value Date   CREATININE 0.60 09/28/2024   CREATININE 0.73 08/11/2024   CREATININE 0.67 08/10/2024

## 2024-09-28 NOTE — Assessment & Plan Note (Signed)
 Continue vitamin D supplements.

## 2024-09-28 NOTE — Assessment & Plan Note (Addendum)
-   Expressive aphasia, ruling out CVA, TIA Symptoms resolved, no other focal neurological findings  -CT head-chronic atrophy, with maxillary sinus disease no acute intracranial abnormality -CTA of head and neck: Mild atherosclerosis without a large vessel occlusion or significant proximal stenosis in the head or neck   - Continue home medication of Eliquis , statins, - Will continue with neurochecks -Pending MRI of the brain  - EDP discussed with neurology-recommending to call and consult officially if MRI is positive  - Will follow-up with labs, including  B12, TSH, UA - Consulting PT/OT/speech

## 2024-09-28 NOTE — H&P (Addendum)
 History and Physical   Patient: Kathryn Ayala                            PCP: Shayne Anes, MD                    DOB: 06/13/1935            DOA: 09/28/2024 FMW:[id number]             DOS: 09/28/2024, 4:59 PM  Shayne Anes, MD  Patient coming from:   HOME  I have personally reviewed patient's medical records, in electronic medical records, including:  [facility], and care everywhere.    Chief Complaint:   Chief Complaint  Patient presents with   Aphasia    History of present illness:    Kathryn Ayala is a 89 year old female with extensive history of  P-A-fib on Eliquis , HLD, HTN, fibromyalgia, CKD 3A, osteoporosis, vitamin D  deficiency, remote history of breast cancer... Presenting with headaches and difficulty speaking.  Patient reported that she woke up this morning with a headache, difficulty getting her own words out.  Reporting the she had a double vision few times in the past, only transient.  Denies of having any numbness or asymmetric weaknesses.    ED Evaluation: Blood pressure (!) 177/95, pulse 83, temperature 98.4 F (36.9 C), resp. rate 19, height 5' 3 (1.6 m), weight 68 kg, SpO2 99%. LABs: CBC BMP within normal limits -pending lipid panel, CT head angiogram- Mild atherosclerosis without a large vessel occlusion or significant proximal stenosis in the head or neck. CT head -Atrophy, left maxillary sinus disease, no acute abnormalities MRI-brain-pending  EKG-A-fib with rate of 80 EDP discussed the findings with neurology -who recommended to obtain MRI of the brain, if positive to consult officially.   Patient Denies having: Fever, Chills, Cough, SOB, Chest Pain, Abd pain, N/V/D, dizziness, lightheadedness,  Dysuria, Joint pain, rash, open wounds   Review of Systems: As per HPI, otherwise 10 point review of systems were negative.    ----------------------------------------------------------------------------------------------------------------------  Allergies  Allergen Reactions   Hydrocodone  Other (See Comments)    Makes very confused  Other Reaction(s): Not available  hydrocodone    Penicillins Anaphylaxis, Swelling and Rash    Has patient had a PCN reaction causing immediate rash, facial/tongue/throat swelling, SOB or lightheadedness with hypotension: Yes Has patient had a PCN reaction causing severe rash involving mucus membranes or skin necrosis: Yes Has patient had a PCN reaction that required hospitalization No Has patient had a PCN reaction occurring within the last 10 years: No If all of the above answers are NO, then may proceed with Cephalosporin use.    Azithromycin Other (See Comments)    PT is unsure of reaction   Codeine Other (See Comments)    Patient was told to not take this   Levofloxacin Other (See Comments)    UNSPECIFIED REACTION  Patient was told not to take it.  Other Reaction(s): Not available  Tolerates Cipro    Sulfa Antibiotics Itching, Rash and Dermatitis    Home MEDs:  Prior to Admission medications   Medication Sig Start Date End Date Taking? Authorizing Provider  acetaminophen  (TYLENOL ) 500 MG tablet Take 1,000 mg by mouth every 6 (six) hours as needed for moderate pain.    [provider]  allopurinol  (ZYLOPRIM ) 100 MG tablet Take 100 mg by mouth 2 (two) times daily.  03/04/16   [provider]  apixaban  (ELIQUIS ) 5 MG TABS tablet Take 1 tablet (5 mg total) by mouth 2 (two) times daily. 07/30/24   Barbarann Nest, MD  atenolol  (TENORMIN ) 50 MG tablet Take 50 mg by mouth 2 (two) times daily. 05/05/16   [provider]  B Complex-C (B-COMPLEX WITH VITAMIN C) tablet Take 1 tablet by mouth daily. 11/12/22   Patel, Pranav M, MD  Biotin (BIOTIN 5000) 5 MG CAPS Take 5 mg by mouth in the morning.    [provider]  Cholecalciferol (VITAMIN  D3) 50 MCG (2000 UT) CAPS Take 2,000 Units by mouth in the morning.    [provider]  feeding supplement (ENSURE PLUS HIGH PROTEIN) LIQD Take 237 mLs by mouth 2 (two) times daily between meals. 07/30/24   Barbarann Nest, MD  ferrous sulfate  325 (65 FE) MG tablet Take 1 tablet (325 mg total) by mouth daily with breakfast. 11/11/22 10/06/25  Patel, Pranav M, MD  hydrALAZINE  (APRESOLINE ) 10 MG tablet Take 0.5 tablets (5 mg total) by mouth daily as needed (SBP > 150). 08/11/24   Vann, Jessica U, DO  loratadine  (CLARITIN ) 10 MG tablet Take 10 mg by mouth in the morning.    [provider]  Melatonin 10 MG TABS Take 10 mg by mouth at bedtime.    [provider]  rosuvastatin  (CRESTOR ) 5 MG tablet Take 5 mg by mouth in the morning.    [provider]  saccharomyces boulardii (FLORASTOR) 250 MG capsule Take 1 capsule (250 mg total) by mouth 2 (two) times daily. 08/11/24   Vann, Jessica U, DO  traMADol  (ULTRAM ) 50 MG tablet Take 1 tablet (50 mg total) by mouth every 6 (six) hours as needed for moderate pain (pain score 4-6) or severe pain (pain score 7-10). Patient not taking: Reported on 08/06/2024 10/23/23 10/22/24  Kay Kemps, MD  venlafaxine  XR (EFFEXOR -XR) 37.5 MG 24 hr capsule Take 37.5 mg by mouth in the morning.    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    Tachycardia    Urinary frequency     Past Surgical History:  Procedure Laterality Date   ABDOMINAL HYSTERECTOMY  1996   APPENDECTOMY  1956   BREAST BIOPSY Left 05/07/2016   BREAST BIOPSY Bilateral 1956, 1980   BREAST LUMPECTOMY WITH RADIOACTIVE SEED LOCALIZATION Left 06/16/2016   Procedure: LEFT BREAST LUMPECTOMY WITH RADIOACTIVE SEED LOCALIZATION;  Surgeon: Deward Null III, MD;  Location: [facility] OR;  Service: General;  Laterality: Left;   COLONOSCOPY     KIDNEY STONE SURGERY     KNEE SURGERY  11/20/2004   REVERSE SHOULDER ARTHROPLASTY Right 10/23/2023   Procedure: REVERSE SHOULDER ARTHROPLASTY;  Surgeon: Kay Kemps, MD;  Location: [facility];  Service: Orthopedics;  Laterality: Right;  interscalene block  110   TOTAL HIP ARTHROPLASTY Left  11/09/2022   Procedure: TOTAL HIP ARTHROPLASTY ANTERIOR APPROACH;  Surgeon: Ernie Cough, MD;  Location: [facility];  Service: Orthopedics;  Laterality: Left;   TOTAL KNEE ARTHROPLASTY Right 04/07/2022   Procedure: TOTAL KNEE ARTHROPLASTY;  Surgeon: Melodi Lerner, MD;  Location: [facility];  Service: Orthopedics;  Laterality: Right;   TOTAL KNEE ARTHROPLASTY Left 09/22/2022   Procedure: TOTAL KNEE ARTHROPLASTY;  Surgeon: Melodi Lerner, MD;  Location: [facility];  Service: Orthopedics;  Laterality: Left;     reports that she has never smoked. She has never been exposed to tobacco smoke. She has never used smokeless tobacco. She reports that she does not drink alcohol and does not use drugs.   Family History  Problem Relation Age of Onset   Lung cancer Brother    Stroke Mother    Pneumonia Father    Cancer Brother     Physical Exam:   Vitals:   09/28/24 1238 09/28/24 1241 09/28/24 1328 09/28/24 1547  BP:    (!) 177/95  Pulse:    83  Resp:    19  Temp:      SpO2: 98%  100% 99%  Weight:  68 kg    Height:  5' 3 (1.6 m)     Constitutional: NAD, calm, comfortable Eyes: PERRL, lids and conjunctivae normal ENMT: Mucous membranes are moist. Posterior  pharynx clear of any exudate or lesions.Normal dentition.  Neck: normal, supple, no masses, no thyromegaly Respiratory: clear to auscultation bilaterally, no wheezing, no crackles. Normal respiratory effort. No accessory muscle use.  Cardiovascular: Regular rate and rhythm, no murmurs / rubs / gallops. No extremity edema. 2+ pedal pulses. No carotid bruits.  Abdomen: no tenderness, no masses palpated. No hepatosplenomegaly. Bowel sounds positive.  Musculoskeletal: no clubbing / cyanosis. No joint deformity upper and lower extremities. Good ROM, no contractures. Normal muscle tone.  Neurologic: Speech slow-motor intact, cognition intact  CN II-XII grossly intact. Sensation intact, DTR normal. Strength 5/5 in all 4.  Psychiatric: Normal judgment and insight. Alert and oriented x 3. Normal mood.  Skin: no rashes, lesions, ulcers. No induration    Labs on admission:    I have personally reviewed following labs and imaging studies  CBC: Recent Labs  Lab 09/28/24 1501 09/28/24 1509  WBC 7.4  --   NEUTROABS 5.4  --   HGB 13.0 13.6  HCT 39.6 40.0  MCV 95.7  --   PLT 205  --    Basic Metabolic Panel: Recent Labs  Lab 09/28/24 1509 09/28/24 1621  NA 136 135  K 4.9 4.3  CL 102 101  CO2  --  27  GLUCOSE 112* 112*  BUN 15 11  CREATININE 0.60 0.61  CALCIUM   --  9.4  MG  --  1.7  PHOS  --  2.7    Coagulation Profile: Recent Labs  Lab 09/28/24 1621  INR 1.4*   Cardiac Enzymes: No results for input(s): CKTOTAL, CKMB, CKMBINDEX, TROPONINI in the last 168 hours. BNP (last 3 results) Recent Labs    07/27/24 2122  PROBNP 1,330.0*    Urine analysis:    Component Value Date/Time   COLORURINE YELLOW 08/06/2024 1045   APPEARANCEUR CLEAR 08/06/2024 1045   LABSPEC 1.015 08/06/2024 1045   PHURINE 5.0 08/06/2024 1045   GLUCOSEU NEGATIVE 08/06/2024 1045   HGBUR SMALL (A) 08/06/2024 1045   BILIRUBINUR NEGATIVE 08/06/2024 1045   KETONESUR NEGATIVE 08/06/2024 1045    PROTEINUR 100 (A) 08/06/2024 1045   NITRITE NEGATIVE 08/06/2024 1045  LEUKOCYTESUR SMALL (A) 08/06/2024 1045    Last A1C:  No results found for: HGBA1C   Radiologic Exams on Admission:   CT ANGIO HEAD NECK W WO CM Result Date: 09/28/2024 EXAM: CTA HEAD AND NECK WITH AND WITHOUT 09/28/2024 02:52:00 PM TECHNIQUE: CTA of the head and neck was performed with and without the administration of 75 mL of iohexol  (OMNIPAQUE ) 350 MG/ML injection. Multiplanar 2D and/or 3D reformatted images are provided for review. Automated exposure control, iterative reconstruction, and/or weight based adjustment of the mA/kV was utilized to reduce the radiation dose to as low as reasonably achievable. Stenosis of the internal carotid arteries measured using NASCET criteria. COMPARISON: None available CLINICAL HISTORY: Headache, sudden, severe. Aphasia. FINDINGS: CTA NECK: AORTIC ARCH AND ARCH VESSELS: Normal variant aortic arch branching pattern with common origin of the brachiocephalic and left common carotid arteries. Mild atherosclerosis in the aortic arch. No dissection or arterial injury. No significant stenosis of the brachiocephalic or subclavian arteries. CERVICAL CAROTID ARTERIES: Small amount of calcified plaque at the left carotid bifurcation and in the right carotid bulb. No dissection, arterial injury, or hemodynamically significant stenosis by NASCET criteria. CERVICAL VERTEBRAL ARTERIES: Strongly dominant right and diffusely hypoplastic left vertebral arteries. No dissection, arterial injury, or significant stenosis. LUNGS AND MEDIASTINUM: Biapical pleural parenchymal lung scarring. SOFT TISSUES: No acute abnormality. BONES: Focally advanced disc degeneration at C6-C7. Advanced multilevel cervical facet arthrosis. CTA HEAD: ANTERIOR CIRCULATION: The intracranial internal carotid arteries are patent with mild atherosclerosis not resulting in a significant stenosis. The right A1 segment is severely hypoplastic  with the right ACA predominantly supplied via the anterior communicating artery. ACAs and MCAs are patent without evidence of a proximal branch occlusion or significant proximal stenosis. No aneurysm. POSTERIOR CIRCULATION: The intracranial right vertebral artery is patent and supplies the basilar artery while the left vertebral artery is hypoplastic and terminates in PICA. The basilar artery is widely patent. There are large right and diminutive or absent left posterior communicating arteries with hypoplasia of the right P1 segment. Both PCAs are patent without evidence of a significant proximal stenosis. No aneurysm. OTHER: No dural venous sinus thrombosis on this non-dedicated study. IMPRESSION: 1. Mild atherosclerosis without a large vessel occlusion or significant proximal stenosis in the head or neck. Electronically signed by: Dasie Hamburg MD 09/28/2024 03:05 PM EST RP Workstation: [id number]   CT HEAD WO CONTRAST Result Date: 09/28/2024 CLINICAL DATA:  Aphasia, acute neuro deficit EXAM: CT HEAD WITHOUT CONTRAST TECHNIQUE: Contiguous axial images were obtained from the base of the skull through the vertex without intravenous contrast. RADIATION DOSE REDUCTION: This exam was performed according to the departmental dose-optimization program which includes automated exposure control, adjustment of the mA and/or kV according to patient size and/or use of iterative reconstruction technique. COMPARISON:  07/27/2024 FINDINGS: Brain: Stable mild atrophy pattern without acute intracranial hemorrhage, mass lesion, definite acute infarction, midline shift, herniation, hydrocephalus, or extra-axial fluid collection. No focal mass effect or edema. Cisterns are patent. Minor cerebellar atrophy as well. Vascular: No hyperdense vessel or unexpected calcification. Skull: Normal. Negative for fracture or focal lesion. Sinuses/Orbits: Orbits are symmetric and unremarkable. Mastoids are clear. Chronic left maxillary sinus  opacification with thickened maxillary walls noted. Remainder of the sinuses are clear. Other: None. IMPRESSION: 1. Stable atrophy pattern. No acute intracranial abnormality by noncontrast CT. 2. Chronic left maxillary sinus disease. Electronically Signed   By: CHRISTELLA.  Shick M.D.   On: 09/28/2024 13:49    EKG:   Independently reviewed.  Orders  placed or performed during the hospital encounter of 09/28/24   EKG 12-Lead   EKG 12-Lead   EKG 12-Lead   EKG 12-Lead   EKG 12-Lead   ---------------------------------------------------------------------------------------------------------------------------------------    Assessment / Plan:   Principal Problem:   Aphasia Active Problems:   Gout   Anemia of chronic disease   Primary hypertension   Paroxysmal atrial fibrillation (HCC)   Osteoporosis   Anxiety disorder   Hyperlipidemia   Intraductal carcinoma in situ of left breast   Vitamin D  deficiency   Chronic kidney disease, stage 3a (HCC)   Hypertensive heart disease with congestive heart failure and chronic kidney disease (HCC)   Assessment and Plan: * Aphasia - Expressive aphasia, ruling out CVA, TIA Symptoms resolved, no other focal neurological findings  -CT head-chronic atrophy, with maxillary sinus disease no acute intracranial abnormality -CTA of head and neck: Mild atherosclerosis without a large vessel occlusion or significant proximal stenosis in the head or neck   - Continue home medication of Eliquis , statins, - Will continue with neurochecks  - EDP discussed with neurology-recommending to call and consult officially if MRI is positive  - Will follow-up with labs, including  B12, TSH, UA - Consulting PT/OT/speech  Addendum: MRI of the brain reviewed, chronic microvascular disease, otherwise no acute intracranial abnormality    Gout - Monitor closely, continue home medication of albuterol, as needed colchicine  Osteoporosis Continue vitamin D   supplements  Paroxysmal atrial fibrillation (HCC) History of A-fib now seems to be persistent -EKG reviewed, persistent atrial fibrillation, with rate of 80 -Continuing Eliquis , atenolol   Primary hypertension Resuming atenolol , as needed hydralazine   Anemia of chronic disease Anemia of chronic disease with iron deficiency and CKD -Continue ferrous sulfate , H&H stable  Vitamin D  deficiency Continue vitamin supplements  Intraductal carcinoma in situ of left breast - In remission, remote history follows in outpatient  Hyperlipidemia LDL goal less than 70 -Continue home dose of Crestor   -obtaining lipid panel  Anxiety disorder History of anxiety, depression As needed Xanax, continue home medication of Effexor   Chronic kidney disease, stage 3a (HCC) BUN/creatinine at baseline - Avoiding hypotension, nephrotoxins  Lab Results  Component Value Date   CREATININE 0.60 09/28/2024   CREATININE 0.73 08/11/2024   CREATININE 0.67 08/10/2024     Hypertensive heart disease with congestive heart failure and chronic kidney disease (HCC) Allowing permissive hypertension, gentle IV fluid hydration -Monitoring BP, closely, monitor creatinine closely -Avoiding volume overload -Will obtain 2D echocardiogram    Consults called:  NONE -will call neurology if MRI positive PT/OT/speech/TOC, palliative care consult -------------------------------------------------------------------------------------------------------------------------------------------- DVT prophylaxis:  SCDs Start: 09/28/24 1605 apixaban  (ELIQUIS ) tablet 5 mg   Code Status:   Code Status: Full Code   Admission status: Patient will be admitted as Observation, with a greater than 2 midnight length of stay. Level of care: Telemetry   Family Communication:  none at bedside  (The above findings and plan of care has been discussed with patient in detail, the patient expressed understanding and agreement of above plan)   --------------------------------------------------------------------------------------------------------------------------------------------------  Disposition Plan:  Anticipated 1-2 days Status is: Observation The patient remains OBS appropriate and will d/c before 2 midnights.   --------------------------------------------------------------------------------------------------------------------------------------  Time spent:  87  Min.  Was spent seeing and evaluating the patient, reviewing all medical records, drawn plan of care.  SIGNED: Adriana DELENA Grams, MD, FHM. FAAFP. [place] [facility], Pager  (Please use [email or web address] to page/ or secure chat through epic) If 7PM-7AM, please contact night-coverage [email or web address],  09/28/2024, 4:59 PM

## 2024-09-28 NOTE — Assessment & Plan Note (Signed)
 LDL goal less than 70 -Continue home dose of Crestor   -obtaining lipid panel

## 2024-09-28 NOTE — Assessment & Plan Note (Signed)
-   In remission, remote history follows in outpatient

## 2024-09-28 NOTE — Assessment & Plan Note (Signed)
 Withholding BP meds, allowing permissive hypertension

## 2024-09-28 NOTE — Assessment & Plan Note (Signed)
 History of A-fib now seems to be persistent -EKG reviewed, persistent atrial fibrillation, with rate of 80 -Continuing Eliquis , atenolol 

## 2024-09-28 NOTE — ED Notes (Signed)
 Pt being transported upstairs. Floor called to accept pt.

## 2024-09-28 NOTE — ED Triage Notes (Signed)
 Patient bib EMS from home after daughter noticed that she was having trouble finding her words this morning. LKN was 8pm last night. EMS reports no other neuro deficits at this time. She states she has a headache and feels funny. She is hypertensive on arrival to ED.

## 2024-09-28 NOTE — TOC CM/SW Note (Signed)
 TOC consult received for d/c planning needs. Follow-up to be completed with patient as appropriate.   Merilee Batty, MSN, RN Case Management [phone]

## 2024-09-28 NOTE — Assessment & Plan Note (Signed)
 Allowing permissive hypertension, gentle IV fluid hydration -Monitoring BP, closely, monitor creatinine closely -Avoiding volume overload -Will obtain 2D echocardiogram

## 2024-09-28 NOTE — Assessment & Plan Note (Signed)
 History of anxiety, depression As needed Xanax, continue home medication of Effexor 

## 2024-09-28 NOTE — ED Notes (Signed)
 Pt transported to MRI

## 2024-09-28 NOTE — Progress Notes (Signed)
° °  Brief Progress Note   _____________________________________________________________________________________________________________  Patient Name: Kathryn Ayala Patient DOB: [birth year] Date: @TODAY @      Data: Reviewed vital signs, labs, and notes.    Action: No action required at this time.     Response:  Bed assigned.   _____________________________________________________________________________________________________________  [employer] RN Expeditor Shamanda Len S Yilia Sacca Please contact us  directly via secure chat (search for [facility]) or by calling us  at [phone] [facility]).

## 2024-09-28 NOTE — Assessment & Plan Note (Signed)
-   Monitor closely, continue home medication of albuterol, as needed colchicine

## 2024-09-28 NOTE — Hospital Course (Addendum)
 Rosezella Kronick is a 89 year old female with extensive history of  P-A-fib on Eliquis , HLD, HTN, fibromyalgia, CKD 3A, osteoporosis, vitamin D  deficiency, remote history of breast cancer... Presenting with headaches and difficulty speaking.  Patient reported that she woke up this morning with a headache, difficulty getting her own words out.  Reporting the she had a double vision few times in the past, only transient.  Denies of having any numbness or asymmetric weaknesses.    ED Evaluation: Blood pressure (!) 177/95, pulse 83, temperature 98.4 F (36.9 C), resp. rate 19, height 5' 3 (1.6 m), weight 68 kg, SpO2 99%. LABs: CBC BMP within normal limits -pending lipid panel, CT head angiogram- Mild atherosclerosis without a large vessel occlusion or significant proximal stenosis in the head or neck. CT head -Atrophy, left maxillary sinus disease, no acute abnormalities MRI-brain-pending  EKG-A-fib with rate of 80 EDP discussed the findings with neurology -who recommended to obtain MRI of the brain, if positive to consult officially.

## 2024-09-29 ENCOUNTER — Observation Stay (HOSPITAL_COMMUNITY)

## 2024-09-29 ENCOUNTER — Encounter (HOSPITAL_COMMUNITY): Payer: Self-pay | Admitting: Internal Medicine

## 2024-09-29 ENCOUNTER — Other Ambulatory Visit (HOSPITAL_COMMUNITY): Payer: Self-pay

## 2024-09-29 DIAGNOSIS — Z515 Encounter for palliative care: Secondary | ICD-10-CM

## 2024-09-29 DIAGNOSIS — M810 Age-related osteoporosis without current pathological fracture: Secondary | ICD-10-CM | POA: Diagnosis not present

## 2024-09-29 DIAGNOSIS — Z7189 Other specified counseling: Secondary | ICD-10-CM

## 2024-09-29 DIAGNOSIS — I480 Paroxysmal atrial fibrillation: Secondary | ICD-10-CM | POA: Diagnosis not present

## 2024-09-29 DIAGNOSIS — Z66 Do not resuscitate: Secondary | ICD-10-CM | POA: Diagnosis not present

## 2024-09-29 DIAGNOSIS — R4701 Aphasia: Secondary | ICD-10-CM | POA: Diagnosis not present

## 2024-09-29 LAB — ECHOCARDIOGRAM COMPLETE
Area-P 1/2: 4.86 cm2
Height: 63 in
S' Lateral: 3.2 cm
Weight: 2400 [oz_av]

## 2024-09-29 LAB — VITAMIN B12: Vitamin B-12: 481 pg/mL (ref 180–914)

## 2024-09-29 MED ORDER — GUAIFENESIN ER 600 MG PO TB12
600.0000 mg | ORAL_TABLET | Freq: Two times a day (BID) | ORAL | Status: DC
  Administered 2024-09-29: 600 mg via ORAL
  Filled 2024-09-29: qty 1

## 2024-09-29 MED ORDER — GUAIFENESIN ER 600 MG PO TB12
600.0000 mg | ORAL_TABLET | Freq: Two times a day (BID) | ORAL | 0 refills | Status: AC
  Filled 2024-09-29: qty 20, 10d supply, fill #0

## 2024-09-29 MED ORDER — LISINOPRIL 20 MG PO TABS: 20.0000 mg | ORAL_TABLET | Freq: Every day | ORAL | Status: AC

## 2024-09-29 NOTE — TOC Initial Note (Addendum)
 Transition of Care [facility]) - Initial/Assessment Note    Patient Details  Name: Kathryn Ayala MRN: [id number] Date of Birth: 1935/05/06  Transition of Care [facility]) CM/SW Contact:    Andrez JULIANNA George, RN Phone Number: 09/29/2024, 12:08 PM  Clinical Narrative:                 Kathryn Ayala is a 89 year old female with extensive history of P-A-fib on Eliquis , HLD, HTN, fibromyalgia, CKD 3A, osteoporosis, vitamin D  deficiency, remote history of breast cancer... Presenting with headaches and difficulty speaking.   Pt is from home with her Granddaughter that works during the daytime. Pt is alone during these hours.  Pt drives self.  Pt manages her own medications and says most of the time she doesn't have any issues. Recently she says they changed her lisinopril dose 3 times and she did get confused on what she should be taking.   Awaiting therapy evals.  IP Care management following.  1329: therapy recommending HH services. Pt states she thinks she is still active with [facility]. She wants to either continue with them or use them again. Referral sent through the Hub.   Expected Discharge Plan: Home/Self Care Barriers to Discharge: Continued Medical Work up   Patient Goals and CMS Choice            Expected Discharge Plan and Services   Discharge Planning Services: CM Consult   Living arrangements for the past 2 months: Single Family Home                                      Prior Living Arrangements/Services Living arrangements for the past 2 months: Single Family Home Lives with:: Relatives (Granddaughter) Patient language and need for interpreter reviewed:: Yes Do you feel safe going back to the place where you live?: Yes        Care giver support system in place?: No (comment) Current home services: DME (WC/ BSC/ walker/ rollator/ shower seat) Criminal Activity/Legal Involvement Pertinent to Current Situation/Hospitalization: No - Comment as  needed  Activities of Daily Living   ADL Screening (condition at time of admission) Independently performs ADLs?: Yes (appropriate for developmental age) Is the patient deaf or have difficulty hearing?: No Does the patient have difficulty seeing, even when wearing glasses/contacts?: No Does the patient have difficulty concentrating, remembering, or making decisions?: No  Permission Sought/Granted                  Emotional Assessment Appearance:: Appears stated age Attitude/Demeanor/Rapport: Engaged Affect (typically observed): Accepting Orientation: : Oriented to Place, Oriented to  Time, Oriented to Situation, Oriented to Self   Psych Involvement: No (comment)  Admission diagnosis:  Aphasia [R47.01] Expressive aphasia [R47.01] Patient Active Problem List   Diagnosis Date Noted   Aphasia 09/28/2024   Osteoporosis 08/30/2024   Acute diverticulitis 08/06/2024   Pathological fracture 08/06/2024   Diverticulitis of intestine without perforation or abscess 07/28/2024   Permanent atrial fibrillation with RVR (HCC) 07/27/2024   Rotator cuff tear arthropathy 08/01/2023   S/P total left hip arthroplasty 11/09/2022   Osteoarthritis of left knee 09/22/2022   Osteoarthritis of right knee 04/07/2022   Chronic kidney disease, stage 3a (HCC) 02/06/2022   Peripheral venous insufficiency 02/06/2022   Varicose veins of bilateral lower extremities with other complications 02/06/2022   Peripheral vascular disease 01/30/2022   Thrombophilia 12/15/2019   Vitamin D  deficiency  08/25/2019   Primary osteoarthritis of both knees 08/16/2019   Sensorineural hearing loss (SNHL) of right ear with unrestricted hearing of left ear 03/08/2019   Hypertensive heart disease with congestive heart failure and chronic kidney disease (HCC) 03/06/2019   Unilateral primary osteoarthritis, right knee 04/06/2018   Dermatitis 12/29/2017   Asymptomatic microscopic hematuria 06/24/2017   Trigger finger, right  middle finger 02/16/2017   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 [place] [address] Phone: [phone] Fax: [phone]     Social Drivers of Health (SDOH) Social History: SDOH Screenings   Food Insecurity: No Food Insecurity (09/28/2024)  Housing: Low Risk (09/28/2024)  Transportation Needs: No Transportation Needs (09/28/2024)  Utilities: Not At Risk (09/28/2024)  Recent Concern: Utilities - At Risk (08/06/2024)  Social Connections: Socially Isolated (09/28/2024)  Tobacco Use: Low Risk (09/28/2024)   SDOH Interventions:     Readmission Risk Interventions    08/09/2024    2:21 PM  Readmission Risk Prevention Plan  Transportation Screening Complete  PCP or Specialist Appt within 5-7 Days Complete  Home Care Screening Complete  Medication Review (RN CM) Complete

## 2024-09-29 NOTE — Evaluation (Signed)
 Occupational Therapy Evaluation Patient Details Name: Kathryn Ayala MRN: [id number] DOB: 1935/05/08 Today's Date: 09/29/2024   History of Present Illness   Kathryn Ayala is a 89 y.o. female who presented to [facility] ED 09/28/24 for headaches and difficulty speaking. CT head angiogram: mild atherosclerosis without a large vessel occlusion or significant proximal stenosis in the head or neck. CT head: atrophy, left maxillary sinus disease, no acute abnormalities. MRI: no acute intracranial abnormality, mild to moderate chronic small vessel ischemic disease. PMHx: Paroxysmal A-fib on Eliquis , HLD, HTN, fibromyalgia, CKD 3A, osteoporosis, vitamin D  deficiency, and breast cancer.     Clinical Impressions Pt seen this afternoon for OT evaluation. AOX4, uses external aides for time orientation. PTA, pt was living with her granddaughter and was fully indep with ADLs, light housekeeping, driving, and medication mgmt via pillbox. Pt is HoH and may have impacted cognition/communication. Noted deficits in [facility] recall, working memory, and attention. Although, she scored 0/28 on Short Blessed Test, indicating no cognitive impairment. Functionally, she was min A for transfers and CGA for ambulation in room with RW. CGA for seated and standing UB/LB ADLs. Limited by generalized weakness, attention/recall deficits, and reduced activity tolerance.  Pt anticipating discharge later today, recommending home with increased assist for IADLs and resumption of HHOT.     If plan is discharge home, recommend the following:   A little help with walking and/or transfers;A little help with bathing/dressing/bathroom;Assistance with cooking/housework;Direct supervision/assist for medications management;Direct supervision/assist for financial management;Supervision due to cognitive status;Assist for transportation     Functional Status Assessment   Patient has had a recent decline in their functional status and  demonstrates the ability to make significant improvements in function in a reasonable and predictable amount of time.     Equipment Recommendations   None recommended by OT     Recommendations for Other Services         Precautions/Restrictions   Precautions Precautions: Fall Recall of Precautions/Restrictions: Intact Restrictions Weight Bearing Restrictions Per Provider Order: No     Mobility Bed Mobility Overal bed mobility: Modified Independent             General bed mobility comments: used bed rails to assist with sitting up to EOB    Transfers Overall transfer level: Needs assistance Equipment used: Rolling walker (2 wheels) Transfers: Sit to/from Stand Sit to Stand: Min assist           General transfer comment: Incr assist and effort to come to standing from EOB, VC for hand placement relative to RW.      Balance Overall balance assessment: Needs assistance, History of Falls Sitting-balance support: No upper extremity supported, Feet supported Sitting balance-Leahy Scale: Good Sitting balance - Comments: seated EOB   Standing balance support: Bilateral upper extremity supported, During functional activity, Reliant on assistive device for balance Standing balance-Leahy Scale: Fair Standing balance comment: reliant on RW, CGA                           ADL either performed or assessed with clinical judgement   ADL Overall ADL's : Needs assistance/impaired Eating/Feeding: Independent   Grooming: Supervision/safety;Standing;Wash/dry hands               Lower Body Dressing: Contact guard assist;Sitting/lateral leans   Toilet Transfer: Minimal assistance;Ambulation;Regular Toilet;Rolling walker (2 wheels)   Toileting- Clothing Manipulation and Hygiene: Set up;Cueing for sequencing;Sitting/lateral lean       Functional mobility during  ADLs: Supervision/safety;Rolling walker (2 wheels)       Vision Baseline  Vision/History: 1 Wears glasses Ability to See in Adequate Light: 0 Adequate Patient Visual Report:  (wears readers at baseline; pt endorsing intermittent diplopia, none reported today during session) Vision Assessment?: No apparent visual deficits     Perception         Praxis         Pertinent Vitals/Pain Pain Assessment Pain Assessment: No/denies pain     Extremity/Trunk Assessment Upper Extremity Assessment Upper Extremity Assessment: Generalized weakness   Lower Extremity Assessment Lower Extremity Assessment: Defer to PT evaluation   Cervical / Trunk Assessment Cervical / Trunk Assessment: Normal   Communication Communication Communication: Impaired Factors Affecting Communication: Hearing impaired (HoH)   Cognition Arousal: Alert Behavior During Therapy: WFL for tasks assessed/performed Cognition: Cognition impaired   Orientation impairments:  (AOX4, using external aides to assist with time orientation) Awareness: Online awareness impaired Memory impairment (select all impairments): Short-term memory, Working memory Attention impairment (select first level of impairment): Sustained attention (distractable) Executive functioning impairment (select all impairments): Problem solving OT - Cognition Comments: niece reporting her mind is super sharp, but observed recall, attention, and insight deficits; she scored 0/28 on Short Blessed Test                 Following commands: Impaired Following commands impaired: Follows one step commands with increased time, Follows multi-step commands inconsistently     Cueing  General Comments   Cueing Techniques: Verbal cues;Gestural cues;Tactile cues  VSS on RA; supportive niece present during OT session   Exercises     Shoulder Instructions      Home Living Family/patient expects to be discharged to:: Private residence Living Arrangements: Other relatives (granddaughter) Available Help at Discharge:  Family;Available PRN/intermittently (granddaughter works and is not available during the day) Type of Home: House Home Access: Stairs to enter [employer] of Steps: 2 Entrance Stairs-Rails: Left Home Layout: One level     Bathroom Shower/Tub: Producer, Television/film/video: Handicapped height Bathroom Accessibility: Yes How Accessible: Accessible via walker Home Equipment: Grab bars - tub/shower;Grab bars - toilet;Rolling Walker (2 wheels);Shower seat;Hand held shower head;BSC/3in1;Cane - single point;Rollator (4 wheels)   Additional Comments: pt's granddaughter stays with her but works different hours during the day      Prior Functioning/Environment Prior Level of Function : Independent/Modified Independent;Driving             Mobility Comments: uses rollator vs RW PTA, endorses recent onset of headaches and intermittent dizziness and diplopia ADLs Comments: indep with ADLs and light homemaking, grocery shops & drives    OT Problem List: Decreased strength;Impaired balance (sitting and/or standing);Decreased safety awareness;Decreased cognition;Decreased range of motion   OT Treatment/Interventions:        OT Goals(Current goals can be found in the care plan section)   Acute Rehab OT Goals Patient Stated Goal: go home   OT Frequency:       Co-evaluation              AM-PAC OT 6 Clicks Daily Activity     Outcome Measure Help from another person eating meals?: None Help from another person taking care of personal grooming?: A Little Help from another person toileting, which includes using toliet, bedpan, or urinal?: A Little Help from another person bathing (including washing, rinsing, drying)?: A Little Help from another person to put on and taking off regular upper body clothing?: A Lot Help from  another person to put on and taking off regular lower body clothing?: A Little 6 Click Score: 18   End of Session Equipment Utilized During  Treatment: Gait belt;Rolling walker (2 wheels) Nurse Communication: Mobility status  Activity Tolerance: Patient tolerated treatment well Patient left: in bed;with call bell/phone within reach;with bed alarm set;with family/visitor present  OT Visit Diagnosis: Unsteadiness on feet (R26.81);Other abnormalities of gait and mobility (R26.89);History of falling (Z91.81)                Time: 1410-1441 OT Time Calculation (min): 31 min Charges:  OT General Charges $OT Visit: 1 Visit OT Evaluation $OT Eval Low Complexity: 1 Low OT Treatments $Self Care/Home Management : 8-22 mins  Coraleigh Sheeran M. [place], OTR/L [facility] Acute Rehabilitation Services [phone] Secure Chat Preferred  Corney Knighton 09/29/2024, 3:48 PM

## 2024-09-29 NOTE — TOC Transition Note (Signed)
 Transition of Care [facility]) - Discharge Note   Patient Details  Name: Kathryn Ayala MRN: [id number] Date of Birth: 1934-10-27  Transition [facility] Care [facility]) CM/SW Contact:  Andrez JULIANNA George, RN Phone Number: 09/29/2024, 3:07 PM   Clinical Narrative:     Pt is discharging home with resumption of HH services through [place]. Information on the AVS.  Pt has transportation home.  Final next level of care: Home w Home Health Services Barriers to Discharge: No Barriers Identified   Patient Goals and CMS Choice            Discharge Placement                       Discharge Plan and Services Additional resources added to the After Visit Summary for     Discharge Planning Services: CM Consult                      HH Arranged: PT, OT [facility] Agency: [facility] Date [facility] Agency Contacted: 09/29/24   Representative spoke with at [facility] Agency: [place]leene  Social Drivers of Health (SDOH) Interventions SDOH Screenings   Food Insecurity: No Food Insecurity (09/28/2024)  Housing: Low Risk (09/28/2024)  Transportation Needs: No Transportation Needs (09/28/2024)  Utilities: Not At Risk (09/28/2024)  Recent Concern: Utilities - At Risk (08/06/2024)  Social Connections: Socially Isolated (09/28/2024)  Tobacco Use: Low Risk (09/28/2024)     Readmission Risk Interventions    08/09/2024    2:21 PM  Readmission Risk Prevention Plan  Transportation Screening Complete  PCP or Specialist Appt within 5-7 Days Complete  Home Care Screening Complete  Medication Review (RN CM) Complete

## 2024-09-29 NOTE — Evaluation (Signed)
 Speech Language Pathology Evaluation Patient Details Name: Kathryn Ayala MRN: [id number] DOB: [birth year] Today's Date: 09/29/2024 Time: 9048-8980 SLP Time Calculation (min) (ACUTE ONLY): 28 min  Problem List:  Patient Active Problem List   Diagnosis Date Noted   Aphasia 09/28/2024   Osteoporosis 08/30/2024   Acute diverticulitis 08/06/2024   Pathological fracture 08/06/2024   Diverticulitis of intestine without perforation or abscess 07/28/2024   Permanent atrial fibrillation with RVR (HCC) 07/27/2024   Rotator cuff tear arthropathy 08/01/2023   S/P total left hip arthroplasty 11/09/2022   Osteoarthritis of left knee 09/22/2022   Osteoarthritis of right knee 04/07/2022   Chronic kidney disease, stage 3a (HCC) 02/06/2022   Peripheral venous insufficiency 02/06/2022   Varicose veins of bilateral lower extremities with other complications 02/06/2022   Peripheral vascular disease 01/30/2022   Thrombophilia 12/15/2019   Vitamin D  deficiency 08/25/2019   Primary osteoarthritis of both knees 08/16/2019   Sensorineural hearing loss (SNHL) of right ear with unrestricted hearing of left ear 03/08/2019   Hypertensive heart disease with congestive heart failure and chronic kidney disease (HCC) 03/06/2019   Unilateral primary osteoarthritis, right knee 04/06/2018   Dermatitis 12/29/2017   Asymptomatic microscopic hematuria 06/24/2017   Trigger finger, right middle finger 02/16/2017   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) 05/26/2016   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/12/2016   Chronic kidney disease due to hypertension 02/22/2015   Disorder of bone 06/21/2013   Gout 04/26/2012   Hyperlipidemia 04/26/2012   Proteinuria 04/26/2012   Paroxysmal atrial fibrillation (HCC) 12/08/2011   Allergic rhinitis 12/11/2009   Anemia of chronic disease 12/11/2009   Anxiety disorder 12/11/2009   Diaphragmatic hernia 12/11/2009   Primary hypertension  12/11/2009   Fibromyalgia 12/11/2009   Past Medical History:  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   Tachycardia    Urinary frequency    Past Surgical History:  Past Surgical History:  Procedure Laterality Date   ABDOMINAL HYSTERECTOMY  1996   APPENDECTOMY  1956   BREAST BIOPSY Left 05/07/2016   BREAST BIOPSY Bilateral 1956, 1980   BREAST LUMPECTOMY WITH RADIOACTIVE SEED LOCALIZATION Left 06/16/2016   Procedure: LEFT BREAST LUMPECTOMY WITH RADIOACTIVE SEED LOCALIZATION;  Surgeon: Deward Null III, MD;  Location: [facility] OR;  Service: General;  Laterality: Left;   COLONOSCOPY     KIDNEY STONE SURGERY     KNEE SURGERY  11/20/2004   REVERSE SHOULDER ARTHROPLASTY Right 10/23/2023   Procedure: REVERSE SHOULDER ARTHROPLASTY;  Surgeon: Kay Kemps, MD;  Location: [facility] ORS;  Service: Orthopedics;  Laterality: Right;  interscalene block  110   TOTAL HIP ARTHROPLASTY Left 11/09/2022   Procedure: TOTAL HIP ARTHROPLASTY ANTERIOR APPROACH;  Surgeon: Ernie Cough, MD;  Location: [facility] ORS;  Service: Orthopedics;  Laterality: Left;   TOTAL KNEE ARTHROPLASTY Right 04/07/2022   Procedure: TOTAL KNEE ARTHROPLASTY;  Surgeon: Melodi Lerner, MD;  Location: [facility] ORS;  Service: Orthopedics;  Laterality: Right;  TOTAL KNEE ARTHROPLASTY Left 09/22/2022   Procedure: TOTAL KNEE ARTHROPLASTY;  Surgeon: Melodi Lerner, MD;  Location: [facility] ORS;  Service: Orthopedics;  Laterality: Left;   HPI:  Kathryn Ayala is a 89 y.o. female who presented to [facility] ED 09/28/24 for headaches and difficulty speaking. CT head angiogram: mild atherosclerosis  without a large vessel occlusion or significant proximal stenosis in the head or neck. CT head: atrophy, left maxillary sinus disease, no acute abnormalities. MRI: no acute intracranial abnormality, mild to moderate chronic small vessel ischemic disease. PMHx: Paroxysmal A-fib on Eliquis , HLD, HTN, fibromyalgia, CKD 3A, osteoporosis, vitamin D  deficiency, and breast cancer.   Assessment / Plan / Recommendation Clinical Impression   Pt presents with mild word-finding difficulty in conversation and some structured speech tasks based on informal speech/language assessment completed today. Receptive language and motor speech observed to be WNL.  Pt reported significant spontaneous improvement in expressive language since onset of stroke symptoms. Anticipate residual mild expressive aphasia will continue to spontaneously improve; however, consider OP SLP follow up if anomia persists for more than 1 week once home. Reviewed findings and recommendations with pt and family member. No further acute SLP needs identified. SLP will sign off.     SLP Assessment  SLP Recommendation/Assessment: All further Speech Language Pathology needs can be addressed in the next venue of care SLP Visit Diagnosis: Dysphagia, unspecified (R13.10);Aphasia (R47.01) (recommend GI follow up)     Assistance Recommended at Discharge  Set up Supervision/Assistance  Functional Status Assessment Patient has not had a recent decline in their functional status        SLP Evaluation Cognition  Overall Cognitive Status: Within Functional Limits for tasks assessed Arousal/Alertness: Awake/alert Orientation Level: Oriented X4 Year: 2025 Month: December Day of Week: Correct Awareness: Appears intact Safety/Judgment: Appears intact       Comprehension  Auditory Comprehension Overall Auditory Comprehension: Appears within functional limits for tasks assessed Yes/No Questions: Within Functional Limits Commands: Within Functional  Limits Conversation: Complex Visual Recognition/Discrimination Discrimination: Within Function Limits    Expression Expression Primary Mode of Expression: Verbal Verbal Expression Overall Verbal Expression: Impaired (mild hesitations and word-finding difficulty) Initiation: No impairment Automatic Speech: Name;Social Response Level of Generative/Spontaneous Verbalization: Conversation Repetition: No impairment Naming: No impairment Pragmatics: No impairment Effective Techniques:  (extra processing time) Non-Verbal Means of Communication: Not applicable   Oral / Motor  Oral Motor/Sensory Function Overall Oral Motor/Sensory Function: Within functional limits Motor Speech Overall Motor Speech: Appears within functional limits for tasks assessed Phonation: Normal Resonance: Within functional limits Articulation: Within functional limitis Intelligibility: Intelligible Motor Planning: Within functional limits Motor Speech Errors: Not applicable            Peyton JINNY Rummer 09/29/2024, 11:02 AM

## 2024-09-29 NOTE — Discharge Summary (Signed)
 Physician Discharge Summary   Patient: Kathryn Ayala MRN: [id number] DOB: 05-Aug-1935  Admit date:     09/28/2024  Discharge date: 09/29/2024  Discharge Physician: Sabas GORMAN Brod   PCP: Shayne Anes, MD   Recommendations at discharge:   Follow-up PCP in 2 weeks  Discharge Diagnoses: Principal Problem:   Aphasia Active Problems:   Gout   Anemia of chronic disease   Primary hypertension   Paroxysmal atrial fibrillation (HCC)   Osteoporosis   Anxiety disorder   Hyperlipidemia   Intraductal carcinoma in situ of left breast   Vitamin D  deficiency   Chronic kidney disease, stage 3a (HCC)   Hypertensive heart disease with congestive heart failure and chronic kidney disease (HCC)  Resolved Problems:   * No resolved hospital problems. *  Hospital Course:  89 year old female with extensive history of  P-A-fib on Eliquis , HLD, HTN, fibromyalgia, CKD 3A, osteoporosis, vitamin D  deficiency, remote history of breast cancer... Presenting with headaches and difficulty speaking.  Patient reported that she woke up this morning with a headache, difficulty getting her own words out.  Reporting the she had a double vision few times in the past, only transient.  Denies of having any numbness or asymmetric weaknesses.   Assessment and Plan:   Aphasia -Resolved, unclear etiology -CT head was unremarkable, MRI ruled out stroke,?  TIA -Patient is already on Eliquis  -Speech therapy consult obtained, symptoms have completely resolved -CT head-chronic atrophy, with maxillary sinus disease no acute intracranial abnormality -CTA of head and neck: Mild atherosclerosis without a large vessel occlusion or significant proximal stenosis in the head or neck  - Continue home medication of Eliquis , statins - B12 481, TSH 3.566, UA is clear - PT/OT consult obtained, plan to go home with home health PT  Gout - Monitor closely, continue home medication of allopurinol   Osteoporosis Continue vitamin D   supplements  Paroxysmal atrial fibrillation (HCC) History of A-fib now seems to be persistent -EKG reviewed, persistent atrial fibrillation, with rate of 80 -Continuing Eliquis , atenolol   Primary hypertension Continue atenolol  50 mg twice a day Lisinopril 20 mg daily  Anemia of chronic disease Anemia of chronic disease with iron deficiency and CKD -Continue ferrous sulfate , H&H stable  Vitamin D  deficiency Continue vitamin supplements  Intraductal carcinoma in situ of left breast - In remission, remote history follows in outpatient  Hyperlipidemia LDL goal less than 70; her LDL is 68 -Continue home dose of Crestor     Anxiety disorder History of anxiety, depression continue home medication of Effexor   Chronic kidney disease, stage 3a (HCC) BUN/creatinine at baseline   Lab Results  Component Value Date   CREATININE 0.60 09/28/2024   CREATININE 0.73 08/11/2024   CREATININE 0.67 08/10/2024     Hypertensive heart disease with congestive heart failure and chronic kidney disease (HCC) Allowing permissive hypertension, gentle IV fluid hydration -Monitoring BP, closely, monitor creatinine closely -Avoiding volume overload -Will obtain 2D echocardiogram         Consultants:  Procedures performed:  Disposition: Home Diet recommendation:  Regular diet DISCHARGE MEDICATION: Allergies as of 09/29/2024       Reactions   Hydrocodone  Other (See Comments)   Makes very confused Other Reaction(s): Not available hydrocodone    Penicillins Anaphylaxis, Swelling, Rash   Has patient had a PCN reaction causing immediate rash, facial/tongue/throat swelling, SOB or lightheadedness with hypotension: Yes Has patient had a PCN reaction causing severe rash involving mucus membranes or skin necrosis: Yes Has patient had a PCN reaction that required  hospitalization No Has patient had a PCN reaction occurring within the last 10 years: No If all of the above answers are NO, then  may proceed with Cephalosporin use.   Azithromycin Other (See Comments)   PT is unsure of reaction   Codeine Other (See Comments)   Patient was told to not take this   Levofloxacin Other (See Comments)   UNSPECIFIED REACTION Patient was told not to take it. Other Reaction(s): Not available Tolerates Cipro    Sulfa Antibiotics Itching, Rash, Dermatitis        Medication List     TAKE these medications    acetaminophen  500 MG tablet Commonly known as: TYLENOL  Take 1,000 mg by mouth every 6 (six) hours as needed for moderate pain.   allopurinol  100 MG tablet Commonly known as: ZYLOPRIM  Take 100 mg by mouth daily.   apixaban  5 MG Tabs tablet Commonly known as: ELIQUIS  Take 1 tablet (5 mg total) by mouth 2 (two) times daily.   atenolol  50 MG tablet Commonly known as: TENORMIN  Take 50 mg by mouth 2 (two) times daily.   B-complex with vitamin C tablet Take 1 tablet by mouth daily.   Biotin 5000 5 MG Caps Generic drug: Biotin Take 5 mg by mouth in the morning.   feeding supplement Liqd Take 237 mLs by mouth 2 (two) times daily between meals.   ferrous sulfate  325 (65 FE) MG tablet Take 1 tablet (325 mg total) by mouth daily with breakfast.   guaiFENesin 600 MG 12 hr tablet Commonly known as: MUCINEX Take 1 tablet (600 mg total) by mouth 2 (two) times daily for 10 days.   lisinopril 20 MG tablet Commonly known as: ZESTRIL Take 1 tablet (20 mg total) by mouth daily. What changed: when to take this   loratadine  10 MG tablet Commonly known as: CLARITIN  Take 10 mg by mouth in the morning.   Melatonin 10 MG Tabs Take 10 mg by mouth at bedtime.   rosuvastatin  5 MG tablet Commonly known as: CRESTOR  Take 5 mg by mouth in the morning.   saccharomyces boulardii 250 MG capsule Commonly known as: FLORASTOR Take 1 capsule (250 mg total) by mouth 2 (two) times daily.   venlafaxine  XR 37.5 MG 24 hr capsule Commonly known as: EFFEXOR -XR Take 37.5 mg by mouth in the  morning.   vitamin D3 50 MCG (2000 UT) Caps Take 2,000 Units by mouth in the morning.        Contact information for follow-up providers     Shayne Anes, MD Follow up in 2 week(s).   Specialty: Internal Medicine Contact information: [address] [place] [address] [phone]              Contact information for after-discharge care     Home Medical Care     [facility]) .   Service: Home Health Services Contact information: [address] [place] [address] [place]  [address] [phone]                    Discharge Exam: Kathryn Ayala   09/28/24 1241  Weight: 68 kg   General-appears in no acute distress Heart-S1-S2, regular, no murmur auscultated Lungs-clear to auscultation bilaterally, no wheezing or crackles auscultated Abdomen-soft, nontender, no organomegaly Extremities-no edema in the lower extremities Neuro-alert, oriented x3, no focal deficit noted  Condition at discharge: good  The results of significant diagnostics from this hospitalization (including imaging, microbiology, ancillary and laboratory) are listed  below for reference.   Imaging Studies: ECHOCARDIOGRAM COMPLETE Result Date: 09/29/2024    ECHOCARDIOGRAM REPORT   Patient Name:   Kathryn Ayala Date of Exam: 09/29/2024 Medical Rec #:  [id number]          Height:       63.0 in Accession #:    [id number]         Weight:       150.0 lb Date of Birth:  [birth year]           BSA:          1.711 m Patient Age:    89 years           BP:           116/69 mmHg Patient Gender: F                  HR:           74 bpm. Exam Location:  Inpatient Procedure: 2D Echo, Cardiac Doppler, Color Doppler and Saline Contrast Bubble            Study (Both Spectral and Color Flow Doppler were utilized during            procedure). Indications:    TIA  History:        Patient has no prior history of Echocardiogram examinations.                 Risk Factors:Hypertension  and Dyslipidemia.  Sonographer:    Sherlean Dubin Referring Phys: [phone] SEYED A SHAHMEHDI  Sonographer Comments: Image acquisition challenging due to respiratory motion. IMPRESSIONS  1. Left ventricular ejection fraction, by estimation, is 60 to 65%. The left ventricle has normal function. The left ventricle has no regional wall motion abnormalities. Left ventricular diastolic parameters are indeterminate.  2. Right ventricular systolic function is normal. The right ventricular size is normal.  3. Left atrial size was severely dilated.  4. Right atrial size was severely dilated.  5. The mitral valve is degenerative. Mild mitral valve regurgitation. No evidence of mitral stenosis. Moderate mitral annular calcification.  6. Tricuspid valve regurgitation is severe.  7. The aortic valve is tricuspid. There is mild calcification of the aortic valve. There is mild thickening of the aortic valve. Aortic valve regurgitation is mild. Aortic valve sclerosis is present, with no evidence of aortic valve stenosis.  8. The inferior vena cava is normal in size with greater than 50% respiratory variability, suggesting right atrial pressure of 3 mmHg.  9. Agitated saline contrast bubble study was negative, with no evidence of any interatrial shunt. FINDINGS  Left Ventricle: Left ventricular ejection fraction, by estimation, is 60 to 65%. The left ventricle has normal function. The left ventricle has no regional wall motion abnormalities. Strain was performed and the global longitudinal strain is indeterminate. The left ventricular internal cavity size was normal in size. There is no left ventricular hypertrophy. Left ventricular diastolic parameters are indeterminate. Right Ventricle: The right ventricular size is normal. No increase in right ventricular wall thickness. Right ventricular systolic function is normal. Left Atrium: Left atrial size was severely dilated. Right Atrium: Right atrial size was severely dilated.  Pericardium: Trivial pericardial effusion is present. The pericardial effusion is posterior to the left ventricle. Mitral Valve: The mitral valve is degenerative in appearance. There is moderate thickening of the mitral valve leaflet(s). There is moderate calcification of the mitral valve leaflet(s). Moderate mitral annular calcification. Mild mitral valve regurgitation. No  evidence of mitral valve stenosis. Tricuspid Valve: The tricuspid valve is normal in structure. Tricuspid valve regurgitation is severe. No evidence of tricuspid stenosis. Aortic Valve: The aortic valve is tricuspid. There is mild calcification of the aortic valve. There is mild thickening of the aortic valve. Aortic valve regurgitation is mild. Aortic valve sclerosis is present, with no evidence of aortic valve stenosis. Pulmonic Valve: The pulmonic valve was normal in structure. Pulmonic valve regurgitation is mild. No evidence of pulmonic stenosis. Aorta: The aortic root is normal in size and structure. Venous: The inferior vena cava is normal in size with greater than 50% respiratory variability, suggesting right atrial pressure of 3 mmHg. IAS/Shunts: No atrial level shunt detected by color flow Doppler. Agitated saline contrast was given intravenously to evaluate for intracardiac shunting. Agitated saline contrast bubble study was negative, with no evidence of any interatrial shunt. Additional Comments: 3D was performed not requiring image post processing on an independent workstation and was indeterminate.  LEFT VENTRICLE PLAX 2D LVIDd:         4.70 cm   Diastology LVIDs:         3.20 cm   LV e' medial:    5.87 cm/s LV PW:         1.00 cm   LV E/e' medial:  24.0 LV IVS:        1.00 cm   LV e' lateral:   12.50 cm/s LVOT diam:     2.00 cm   LV E/e' lateral: 11.3 LV SV:         61 LV SV Index:   35 LVOT Area:     3.14 cm  RIGHT VENTRICLE            IVC RV Basal diam:  4.40 cm    IVC diam: 1.80 cm RV S prime:     9.57 cm/s TAPSE (M-mode): 1.5  cm LEFT ATRIUM              Index        RIGHT ATRIUM           Index LA diam:        6.00 cm  3.51 cm/m   RA Area:     34.30 cm LA Vol (A2C):   136.0 ml 79.48 ml/m  RA Volume:   119.00 ml 69.55 ml/m LA Vol (A4C):   136.0 ml 79.48 ml/m LA Biplane Vol: 137.0 ml 80.06 ml/m  AORTIC VALVE LVOT Vmax:   88.50 cm/s LVOT Vmean:  58.950 cm/s LVOT VTI:    0.193 m  AORTA Ao Root diam: 2.80 cm Ao Asc diam:  2.90 cm MITRAL VALVE                TRICUSPID VALVE MV Area (PHT): 4.86 cm     TR Peak grad:   31.4 mmHg MV Decel Time: 156 msec     TR Vmax:        280.00 cm/s MV E velocity: 141.00 cm/s MV A velocity: 43.10 cm/s   SHUNTS MV E/A ratio:  3.27         Systemic VTI:  0.19 m                             Systemic Diam: 2.00 cm Maude Emmer MD Electronically signed by Maude Emmer MD Signature Date/Time: 09/29/2024/11:01:20 AM    Final    MR BRAIN WO CONTRAST Result Date: 09/28/2024  EXAM: MRI BRAIN WITHOUT CONTRAST 09/28/2024 04:29:55 PM TECHNIQUE: Multiplanar multisequence MRI of the head/brain was performed without the administration of intravenous contrast. COMPARISON: Head CT and CTA 09/28/2024. CLINICAL HISTORY: Neuro deficit, acute, stroke suspected. FINDINGS: BRAIN AND VENTRICLES: There is no evidence of an acute infarct, intracranial hemorrhage, mass, midline shift, hydrocephalus, or extra-axial fluid collection. A punctate focus of mild diffusion weighted signal hyperintensity in the right centrum semiovale is without reduced ADC and is attributed to T2 shine through. T2 hyperintensities bilaterally in the cerebral white matter and pons are nonspecific but compatible with mild to moderate chronic small vessel ischemic disease. There is mild generalized cerebral and cerebellar atrophy. Major intracranial vascular flow voids are preserved. ORBITS: Bilateral cataract extraction. SINUSES AND MASTOIDS: Chronic left maxillary sinusitis. Clear mastoid air cells. BONES AND SOFT TISSUES: Normal marrow signal. No acute  soft tissue abnormality. IMPRESSION: 1. No acute intracranial abnormality. 2. Mild to moderate chronic small vessel ischemic disease. Electronically signed by: Dasie Hamburg MD 09/28/2024 05:04 PM EST RP Workstation: [id number]   CT ANGIO HEAD NECK W WO CM Result Date: 09/28/2024 EXAM: CTA HEAD AND NECK WITH AND WITHOUT 09/28/2024 02:52:00 PM TECHNIQUE: CTA of the head and neck was performed with and without the administration of 75 mL of iohexol  (OMNIPAQUE ) 350 MG/ML injection. Multiplanar 2D and/or 3D reformatted images are provided for review. Automated exposure control, iterative reconstruction, and/or weight based adjustment of the mA/kV was utilized to reduce the radiation dose to as low as reasonably achievable. Stenosis of the internal carotid arteries measured using NASCET criteria. COMPARISON: None available CLINICAL HISTORY: Headache, sudden, severe. Aphasia. FINDINGS: CTA NECK: AORTIC ARCH AND ARCH VESSELS: Normal variant aortic arch branching pattern with common origin of the brachiocephalic and left common carotid arteries. Mild atherosclerosis in the aortic arch. No dissection or arterial injury. No significant stenosis of the brachiocephalic or subclavian arteries. CERVICAL CAROTID ARTERIES: Small amount of calcified plaque at the left carotid bifurcation and in the right carotid bulb. No dissection, arterial injury, or hemodynamically significant stenosis by NASCET criteria. CERVICAL VERTEBRAL ARTERIES: Strongly dominant right and diffusely hypoplastic left vertebral arteries. No dissection, arterial injury, or significant stenosis. LUNGS AND MEDIASTINUM: Biapical pleural parenchymal lung scarring. SOFT TISSUES: No acute abnormality. BONES: Focally advanced disc degeneration at C6-C7. Advanced multilevel cervical facet arthrosis. CTA HEAD: ANTERIOR CIRCULATION: The intracranial internal carotid arteries are patent with mild atherosclerosis not resulting in a significant stenosis. The right A1  segment is severely hypoplastic with the right ACA predominantly supplied via the anterior communicating artery. ACAs and MCAs are patent without evidence of a proximal branch occlusion or significant proximal stenosis. No aneurysm. POSTERIOR CIRCULATION: The intracranial right vertebral artery is patent and supplies the basilar artery while the left vertebral artery is hypoplastic and terminates in PICA. The basilar artery is widely patent. There are large right and diminutive or absent left posterior communicating arteries with hypoplasia of the right P1 segment. Both PCAs are patent without evidence of a significant proximal stenosis. No aneurysm. OTHER: No dural venous sinus thrombosis on this non-dedicated study. IMPRESSION: 1. Mild atherosclerosis without a large vessel occlusion or significant proximal stenosis in the head or neck. Electronically signed by: Dasie Hamburg MD 09/28/2024 03:05 PM EST RP Workstation: [id number]   CT HEAD WO CONTRAST Result Date: 09/28/2024 CLINICAL DATA:  Aphasia, acute neuro deficit EXAM: CT HEAD WITHOUT CONTRAST TECHNIQUE: Contiguous axial images were obtained from the base of the skull through the vertex without intravenous contrast. RADIATION DOSE REDUCTION: This  exam was performed according to the departmental dose-optimization program which includes automated exposure control, adjustment of the mA and/or kV according to patient size and/or use of iterative reconstruction technique. COMPARISON:  07/27/2024 FINDINGS: Brain: Stable mild atrophy pattern without acute intracranial hemorrhage, mass lesion, definite acute infarction, midline shift, herniation, hydrocephalus, or extra-axial fluid collection. No focal mass effect or edema. Cisterns are patent. Minor cerebellar atrophy as well. Vascular: No hyperdense vessel or unexpected calcification. Skull: Normal. Negative for fracture or focal lesion. Sinuses/Orbits: Orbits are symmetric and unremarkable. Mastoids are clear.  Chronic left maxillary sinus opacification with thickened maxillary walls noted. Remainder of the sinuses are clear. Other: None. IMPRESSION: 1. Stable atrophy pattern. No acute intracranial abnormality by noncontrast CT. 2. Chronic left maxillary sinus disease. Electronically Signed   By: CHRISTELLA.  Shick M.D.   On: 09/28/2024 13:49    Microbiology: Results for orders placed or performed during the hospital encounter of 08/06/24  C Difficile Quick Screen w PCR reflex     Status: None   Collection Time: 08/08/24 10:15 AM   Specimen: Stool  Result Value Ref Range Status   C Diff antigen NEGATIVE NEGATIVE Final   C Diff toxin NEGATIVE NEGATIVE Final   C Diff interpretation No C. difficile detected.  Final    Comment: Performed at [facility], [address] [place], [place] [address]  Gastrointestinal Panel by PCR , Stool     Status: Abnormal   Collection Time: 08/08/24 10:15 AM   Specimen: Stool  Result Value Ref Range Status   Campylobacter species NOT DETECTED NOT DETECTED Final   Plesimonas shigelloides NOT DETECTED NOT DETECTED Final   Salmonella species NOT DETECTED NOT DETECTED Final   Yersinia enterocolitica NOT DETECTED NOT DETECTED Final   Vibrio species NOT DETECTED NOT DETECTED Final   Vibrio cholerae NOT DETECTED NOT DETECTED Final   Enteroaggregative E coli (EAEC) NOT DETECTED NOT DETECTED Final   Enteropathogenic E coli (EPEC) NOT DETECTED NOT DETECTED Final   Enterotoxigenic E coli (ETEC) NOT DETECTED NOT DETECTED Final   Shiga like toxin producing E coli (STEC) NOT DETECTED NOT DETECTED Final   Shigella/Enteroinvasive E coli (EIEC) NOT DETECTED NOT DETECTED Final   Cryptosporidium NOT DETECTED NOT DETECTED Final   Cyclospora cayetanensis NOT DETECTED NOT DETECTED Final   Entamoeba histolytica NOT DETECTED NOT DETECTED Final   Giardia lamblia NOT DETECTED NOT DETECTED Final   Adenovirus F40/41 NOT DETECTED NOT DETECTED Final   Astrovirus DETECTED (A) NOT  DETECTED Final   Norovirus GI/GII NOT DETECTED NOT DETECTED Final   Rotavirus A NOT DETECTED NOT DETECTED Final   Sapovirus (I, II, IV, and V) NOT DETECTED NOT DETECTED Final    Comment: Performed at [facility], [address] [place], [place] [address]    Labs: CBC: Recent Labs  Lab 09/28/24 1501 09/28/24 1509  WBC 7.4  --   NEUTROABS 5.4  --   HGB 13.0 13.6  HCT 39.6 40.0  MCV 95.7  --   PLT 205  --    Basic Metabolic Panel: Recent Labs  Lab 09/28/24 1509 09/28/24 1621  NA 136 135  K 4.9 4.3  CL 102 101  CO2  --  27  GLUCOSE 112* 112*  BUN 15 11  CREATININE 0.60 0.61  CALCIUM   --  9.4  MG  --  1.7  PHOS  --  2.7   Liver Function Tests: Recent Labs  Lab 09/28/24 1621  AST 43*  ALT  23  ALKPHOS 100  BILITOT 1.5*  PROT 7.2  ALBUMIN  4.1   CBG: No results for input(s): GLUCAP in the last 168 hours.  Discharge time spent: greater than 30 minutes.  Signed: Sabas GORMAN Brod, MD [facility] 09/29/2024

## 2024-09-29 NOTE — Evaluation (Signed)
 Physical Therapy Evaluation and Discharge Patient Details Name: Kathryn Ayala MRN: [id number] DOB: 1934-11-01 Today's Date: 09/29/2024  History of Present Illness  Kathryn Ayala is a 89 y.o. female who presented to [facility] ED 09/28/24 for headaches and difficulty speaking. CT head angiogram: mild atherosclerosis without a large vessel occlusion or significant proximal stenosis in the head or neck. CT head: atrophy, left maxillary sinus disease, no acute abnormalities. MRI: no acute intracranial abnormality, mild to moderate chronic small vessel ischemic disease. PMHx: Paroxysmal A-fib on Eliquis , HLD, HTN, fibromyalgia, CKD 3A, osteoporosis, vitamin D  deficiency, and breast cancer.   Clinical Impression  Pt admitted with above diagnosis. PTA, pt was modI for functional mobility using rollator vs. RW, modI with ADLs/IADLs, and driving. She lives with her granddaughter in a one story house with 2 STE. Pt currently with functional limitations due to the deficits listed below (see PT Problem List). She required CGA for transfers, gait, and stairs using RW. Pt ambulated ~50ft twice with a step-through gait pattern. She ascended/descended 2 steps three times initially forwards with unilateral UE support progressing to sideways with BUE support on handrail for increased stability/support. Pt appears to be near her baseline function, no further acute PT needs. Recommend HHPT to increase strength, improve balance, decrease fall risk, and optimize safety within the home environment. Pt feels ready and safe for discharge home. I have answered all her questions related to mobility.      If plan is discharge home, recommend the following: A little help with walking and/or transfers;A little help with bathing/dressing/bathroom;Assistance with cooking/housework;Assist for transportation;Help with stairs or ramp for entrance   Can travel by private vehicle        Equipment Recommendations None recommended by  PT (Pt already has needed DME)  Recommendations for Other Services       Functional Status Assessment Patient has had a recent decline in their functional status and demonstrates the ability to make significant improvements in function in a reasonable and predictable amount of time.     Precautions / Restrictions Precautions Precautions: Fall Recall of Precautions/Restrictions: Intact Restrictions Weight Bearing Restrictions Per Provider Order: No      Mobility  Bed Mobility               General bed mobility comments: Not assessed. Pt greeted seated EOB with RN.    Transfers Overall transfer level: Needs assistance Equipment used: Rolling walker (2 wheels) Transfers: Sit to/from Stand, Bed to chair/wheelchair/BSC Sit to Stand: Contact guard assist   Step pivot transfers: Contact guard assist       General transfer comment: Pt stood from lowest bed height. She demonstrated proper hand placement using RW. Powered up with CGA. Transferred to recliner chair. Good eccentric control.    Ambulation/Gait Ambulation/Gait assistance: Contact guard assist Gait Distance (Feet): 80 Feet (x2) Assistive device: Rolling walker (2 wheels) Gait Pattern/deviations: Step-through pattern, Decreased stride length, Decreased dorsiflexion - right, Decreased dorsiflexion - left Gait velocity: reduced Gait velocity interpretation: <1.8 ft/sec, indicate of risk for recurrent falls   General Gait Details: Pt ambulated with a step through gait pattern. She demonstrated flat foot contact with limited foot clearence. Cued heel-to-toe gait pattern and increase step height. Pt maintained upright posture with good proximity to RW. She kept AD nearby wall in hallway, slightly veered to R. No LOB. Cues for increase safety awareness.  Stairs Stairs: Yes Stairs assistance: Contact guard assist Stair Management: One rail Left, Forwards, Step to pattern Number of  Stairs: 2 (x3) General stair  comments: Educated pt on stair training in accordance with her home set-up. She ascended/descended one step at a time. Pt initally went forwards with unilateral handrail on L. While descending she attempted to reach out for rail on opposite side, even though it would be present at home. Instructed pt in sideways approach where BUE support would be on handrail. She completed two steps twice using the sideways technique and reported liking it more. Cues for sequencing to help develop memory of steps.  Wheelchair Mobility     Tilt Bed    Modified Rankin (Stroke Patients Only)       Balance Overall balance assessment: History of Falls, Needs assistance Sitting-balance support: No upper extremity supported, Feet supported Sitting balance-Leahy Scale: Good     Standing balance support: Bilateral upper extremity supported, During functional activity, Reliant on assistive device for balance Standing balance-Leahy Scale: Poor Standing balance comment: Pt dependent on RW                             Pertinent Vitals/Pain      Home Living Family/patient expects to be discharged to:: Private residence Living Arrangements: Other relatives (Granddaughter) Available Help at Discharge: Family;Available PRN/intermittently (Granddaughter works during day) Type of Home: House Home Access: Stairs to enter Entrance Stairs-Rails: Acupuncturist of Steps: 2   Home Layout: One level Home Equipment: Grab bars - tub/shower;Grab bars - toilet;Rolling Walker (2 wheels);Shower seat;Hand held shower head;BSC/3in1;Cane - single point;Rollator (4 wheels)      Prior Function Prior Level of Function : Independent/Modified Independent;Driving             Mobility Comments: Ambulates using RW vs. rollator. Pt reports primarily using rollator in the house. She states she had progressed to not really needing an AD or using just a walking stick. Reports 2 falls recently when she didn't  have an AD and that she associated with dizziness and headache. ADLs Comments: ModI with ADLs and light IADLs. Drives. Grocery shops.     Extremity/Trunk Assessment   Upper Extremity Assessment Upper Extremity Assessment: Defer to OT evaluation    Lower Extremity Assessment Lower Extremity Assessment: RLE deficits/detail;LLE deficits/detail RLE Deficits / Details: Decreased hip AROM. Hip flex 3+/5. Knee strength 4/5. Ankle strength 4/5. RLE Sensation: WNL RLE Coordination: decreased gross motor LLE Deficits / Details: Decreased hip AROM. Hip flex 3+/5. Knee strength 4/5. Ankle strength 4/5. LLE Sensation: WNL LLE Coordination: decreased gross motor    Cervical / Trunk Assessment Cervical / Trunk Assessment: Normal  Communication   Communication Communication: Impaired Factors Affecting Communication: Hearing impaired    Cognition Arousal: Alert Behavior During Therapy: WFL for tasks assessed/performed   PT - Cognitive impairments: No family/caregiver present to determine baseline, Memory, Attention, Problem solving                       PT - Cognition Comments: Pt A,Ox4. She had slight difficulty attending to conversations of determining when RN vs. PT was speaking to her. Unable to dual-task. She appears to have delayed processing and requiring repeating of information. Following commands: Impaired Following commands impaired: Follows one step commands with increased time, Follows multi-step commands inconsistently     Cueing Cueing Techniques: Verbal cues, Gestural cues, Tactile cues     General Comments General comments (skin integrity, edema, etc.): VSS on RA.    Exercises     Assessment/Plan  PT Assessment All further PT needs can be met in the next venue of care  PT Problem List Decreased strength;Decreased range of motion;Decreased activity tolerance;Decreased balance;Decreased mobility;Decreased knowledge of use of DME;Decreased safety  awareness       PT Treatment Interventions      PT Goals (Current goals can be found in the Care Plan section)  Acute Rehab PT Goals PT Goal Formulation: All assessment and education complete, DC therapy    Frequency       Co-evaluation               AM-PAC PT 6 Clicks Mobility  Outcome Measure Help needed turning from your back to your side while in a flat bed without using bedrails?: A Little Help needed moving from lying on your back to sitting on the side of a flat bed without using bedrails?: A Little Help needed moving to and from a bed to a chair (including a wheelchair)?: A Little Help needed standing up from a chair using your arms (e.g., wheelchair or bedside chair)?: A Little Help needed to walk in hospital room?: A Little Help needed climbing 3-5 steps with a railing? : A Little 6 Click Score: 18    End of Session Equipment Utilized During Treatment: Gait belt Activity Tolerance: Patient tolerated treatment well Patient left: in chair;with call bell/phone within reach;with chair alarm set Nurse Communication: Mobility status PT Visit Diagnosis: Unsteadiness on feet (R26.81)    Time: 8890-8864 PT Time Calculation (min) (ACUTE ONLY): 26 min   Charges:   PT Evaluation $PT Eval Moderate Complexity: 1 Mod PT Treatments $Gait Training: 8-22 mins PT General Charges $$ ACUTE PT VISIT: 1 Visit         Randall SAUNDERS, PT, DPT Acute Rehabilitation Services Office: [phone] Secure Chat Preferred  Delon CHRISTELLA Callander 09/29/2024, 12:23 PM

## 2024-09-29 NOTE — Plan of Care (Signed)

## 2024-09-29 NOTE — Evaluation (Signed)
 Clinical/Bedside Swallow Evaluation Patient Details  Name: Kathryn Ayala MRN: [id number] Date of Birth: November 07, 1934  Today's Date: 09/29/2024 Time: SLP Start Time (ACUTE ONLY): 0951 SLP Stop Time (ACUTE ONLY): 1019 SLP Time Calculation (min) (ACUTE ONLY): 28 min  Past Medical History:  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   Tachycardia    Urinary frequency    Past Surgical History:  Past Surgical History:  Procedure Laterality Date   ABDOMINAL HYSTERECTOMY  1996   APPENDECTOMY  1956   BREAST BIOPSY Left 05/07/2016   BREAST BIOPSY Bilateral 1956, 1980   BREAST LUMPECTOMY WITH RADIOACTIVE SEED LOCALIZATION Left 06/16/2016   Procedure: LEFT BREAST LUMPECTOMY WITH RADIOACTIVE SEED LOCALIZATION;  Surgeon: Deward Null III, MD;  Location: [facility] OR;  Service: General;  Laterality: Left;   COLONOSCOPY     KIDNEY STONE SURGERY     KNEE SURGERY  11/20/2004   REVERSE SHOULDER ARTHROPLASTY Right 10/23/2023   Procedure: REVERSE SHOULDER ARTHROPLASTY;  Surgeon: Kay Kemps, MD;  Location: [facility] ORS;  Service: Orthopedics;  Laterality: Right;  interscalene block  110   TOTAL HIP ARTHROPLASTY Left 11/09/2022   Procedure: TOTAL HIP ARTHROPLASTY ANTERIOR APPROACH;  Surgeon: Ernie Cough, MD;  Location: [facility] ORS;  Service: Orthopedics;  Laterality: Left;   TOTAL KNEE ARTHROPLASTY Right 04/07/2022   Procedure: TOTAL KNEE ARTHROPLASTY;  Surgeon: Melodi Lerner, MD;  Location: [facility] ORS;  Service: Orthopedics;  Laterality: Right;   TOTAL KNEE ARTHROPLASTY Left 09/22/2022   Procedure: TOTAL KNEE ARTHROPLASTY;  Surgeon: Melodi Lerner, MD;  Location: [facility] ORS;  Service: Orthopedics;  Laterality: Left;   HPI:  Kathryn Ayala is a 89 y.o. female who presented to [facility] ED 09/28/24 for headaches and difficulty speaking. CT head angiogram: mild atherosclerosis without a large vessel occlusion or significant proximal stenosis in the head or neck. CT head: atrophy, left maxillary sinus disease, no acute abnormalities. MRI: no acute intracranial abnormality, mild to moderate chronic small vessel ischemic disease. PMHx: Paroxysmal A-fib on Eliquis , HLD, HTN, fibromyalgia, CKD 3A, osteoporosis, vitamin D  deficiency, and breast cancer.    Assessment / Plan / Recommendation  Clinical Impression   Pt presents with a functional oropharyngeal swallow per clinical swallow assessment completed today. Oral prep and transit prompt with complete oral clearance. Pharyngeal swallow initiation appeared timely with laryngeal elevation noted. No overt or subtle s/s of aspiration noted across consistencies. Pt did report occasional instances of globus sensation with solids and liquids that sometimes reach a point where she has to regurgitate if the sensation does not pass. Per her report, her PCP previously recommended a GI consult to determine is she is a candidate for esophageal dilation, which she said she would pursue if symptoms worsened. SLP encouraged considering follow up as able. Offered MBSS; however, pt politely declined at this time.  Recommend continue current diet at tolerated and PO meds as tolerated. No acute SLP needs identified at this time. SLP will sign off. Please  re-consult if pt exhibits concerns for aspiration with PO intake.   SLP Visit Diagnosis: Dysphagia, unspecified (R13.10);Aphasia (R47.01)    Aspiration Risk  Mild aspiration risk (risk of post prandial aspiration based on esophageal symptoms described)    Diet Recommendation Regular;Thin liquid    Liquid Administration via: Cup;Straw Medication Administration: Whole  meds with liquid Supervision: Patient able to self feed Compensations: Slow rate;Small sips/bites Postural Changes: Seated upright at 90 degrees;Remain upright for at least 30 minutes after po intake    Other Recommendations Recommended Consults: Consider GI evaluation Oral Care Recommendations: Oral care BID     Swallow Evaluation Recommendations     Assistance Recommended at Discharge Set up Supervision/Assistance  Functional Status Assessment Patient has not had a recent decline in their functional status         Prognosis Prognosis for improved oropharyngeal function: Good      Swallow Study   General Date of Onset: 09/28/24 HPI: Kathryn Ayala is a 89 y.o. female who presented to [facility] ED 09/28/24 for headaches and difficulty speaking. CT head angiogram: mild atherosclerosis without a large vessel occlusion or significant proximal stenosis in the head or neck. CT head: atrophy, left maxillary sinus disease, no acute abnormalities. MRI: no acute intracranial abnormality, mild to moderate chronic small vessel ischemic disease. PMHx: Paroxysmal A-fib on Eliquis , HLD, HTN, fibromyalgia, CKD 3A, osteoporosis, vitamin D  deficiency, and breast cancer.    Oral/Motor/Sensory Function Overall Oral Motor/Sensory Function: Within functional limits   Ice Chips Ice chips: Not tested   Thin Liquid Thin Liquid: Within functional limits Presentation: Cup;Self Fed    Nectar Thick Nectar Thick Liquid: Not tested   Honey Thick Honey Thick Liquid: Not tested   Puree Puree: Not tested   Solid     Solid: Within functional limits (various solids on breakfast tray) Presentation: Self Fed      Kathryn Ayala J Kathryn Ayala 09/29/2024,11:10 AM

## 2024-09-29 NOTE — Progress Notes (Signed)
 Palliative Care Consult Note                                  Date: 09/29/2024   Patient Name: Kathryn Ayala  DOB:03/17/1935  FMW:[id number]  Age / Sex:89 y.o., female  PCP: Shayne Anes, MD Referring Physician: Drusilla Sabas RAMAN, MD  Reason for Consultation: Establishing goals of care  Past Medical History:  Diagnosis Date   Anemia    Anxiety    Arthritis    Atrial fibrillation (HCC)    Breast cancer, left (HCC)    Complication of anesthesia    rapid heart beat afterwards sometimes   Depression    Dysrhythmia    A-Fib   Fibromyalgia    History of kidney stones    Hyperlipidemia    Hypertension    Pneumonia    Pre-diabetes    Stroke [facility])    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 female  with past medical history of atrial fibrillation, CKDIII admitted on 09/28/2024 with aphasia and hypertension. Patient admitted 10/8-10/11 and 10/18-10/23 for diverticulitis and discharged home with home health services. Per H&P 09/28/2024 by Willette MD, patient woke up with a headache and difficulty getting her words out. CT head  09/28/2024 demonstrated stable atrophy with no acute intracranial abnormality. CT angio head/neck 09/28/2024 demonstrated mild atherosclerosis without large vessel occlusion or significant stenosis in head or neck. MRI brain on 09/28/2024 demonstrated mild to moderate chronic small vessel ischemic disease and no acute intracranial abnormality.   Palliative medicine consulted for goals of care and code status discussion.   SUMMARY OF RECOMMENDATIONS   DNR-limited Continue current management with hopes to return home with home health Discussed with patient and family that she will need more assistance going forward  Symptom Management:  Per primary team  Code Status: DNR - Limited (DNR/DNI)  Prognosis:   Unable to determine  Discharge Planning:  To Be Determined   Discussed with: Lama MD 09/29/2024 about change in code status and patient inquiry about discharge in the coming days.   Subjective:   Reviewed medical records, received report from team, assessed the patient and then meet at the patient's bedside to discuss diagnosis, prognosis, GOC, EOL wishes disposition and options.  I met with patient and granddaughter [name]) at bedside while joined by daughter [name]) on the phone.   We meet to discuss diagnosis prognosis, GOC, EOL wishes, disposition and options. Concept of Palliative Care was introduced as specialized medical care for people and their families living with serious illness.  If focuses on providing relief from the symptoms and stress of a serious illness.  The goal is to improve quality of life for both the patient and the family. Values and goals of care important to patient and family were attempted to be elicited.  Created space and opportunity for patient  and family to explore thoughts and feelings regarding current medical situation   Natural trajectory and current clinical status were discussed. Questions and concerns addressed. Patient encouraged to call with questions or concerns.    Patient/Family Understanding of Illness: - Patient and family understand the patient had what is most likely a TIA after MRI and CT scans were completed without any acute intracranial abnormality.   Life Review: - Patient was  a geologist, engineering for 40 years - Active in church clubs and administrator, civil service  - Continues to enjoy going out to lunch with church friends  Patient Values: - Being active and independent  Baseline Status: - Patient is independent and highly functional at baseline including continuing to drive - Family does share concerns with more recent shuffling when ambulating - Walks with a walker or cane - Does report more frequent falls in the last  couple of months but has a [employer] badge - Family also shares concern for some confusion over medications and patient's resistance to accept help from family to organize her medication - Has been receiving home health PT once a week since most recent discharge 10/18-10/23 with a home health nurse who visits once a week to obtain vital signs - Lives at home with granddaughter [name]) who provides some assistance who also shares more concern for patient's decline in the last couple of months - Patient shares that she suffers from vertigo which has been causing her to fall more in the last couple of months  Today's Discussion: - Discussed with family functionality prior to admission, current admission, and hopes going forward - Family shared concerns for the patient's functionality and slow decline in the last couple of months with more frequent falls - Patient and family goals for her were clear in that they hope to be discharged home in the coming days as patient is improved from initial admission and is looking forward to continuing home health services - Reviewed CT, MRI, and EKG result with family per request and discussed that there were no abnormal findings to explain her aphasia but that it is most likely a TIA and that the EKG showed known atrial fibrillation - Patient shares that she has been having more persistent headaches, high blood pressure, and anxiety due to her having some issues differentiating between hydroxyzine  and hydralazine  when organizing her medications and she has stopped taking them both to avoid accidental administration - Discussed with patient that her family is concerned for her and would like her to get more assistance with her medication organization and to ask for more help when she is at home even though she likes to be independent - Discussed code status and it was confirmed patient elects to be DNR at this time - Daughter also shares that she has completed a MOST  form and specifically is a DNR-limited, would only time limited trial of feeding tube, but unsure of other sections of her most form - Daughter also shares that patient does have HCPOA documentation and unsure if she has an advanced directive, but will provide it when she is able  Goals: - To get home and return to her normal activities  Review of Systems  Neurological:  Positive for dizziness, speech difficulty and headaches.  All other systems reviewed and are negative.   Objective:   Primary Diagnoses: Present on Admission:  Aphasia  Anemia of chronic disease  Anxiety disorder  Chronic kidney disease, stage 3a (HCC)  Gout  Hyperlipidemia  Hypertensive heart disease with congestive heart failure and chronic kidney disease (HCC)  Osteoporosis  Paroxysmal atrial fibrillation (HCC)  Intraductal carcinoma in situ of left breast  Primary hypertension  Vitamin D  deficiency   Vital Signs:  BP 116/69 (BP Location: Left Arm)   Pulse 66   Temp 98.2 F (36.8 C) (Oral)   Resp (!) 27   Ht 5' 3 (1.6 m)   Wt 68 kg  SpO2 95%   BMI 26.57 kg/m   Physical Exam Constitutional:      Comments: Appears stated age  HENT:     Head: Normocephalic.     Nose: Nose normal.     Mouth/Throat:     Mouth: Mucous membranes are dry.  Eyes:     Extraocular Movements: Extraocular movements intact.  Cardiovascular:     Rate and Rhythm: Rhythm irregular.  Pulmonary:     Effort: Pulmonary effort is normal.  Abdominal:     Palpations: Abdomen is soft.  Skin:    General: Skin is warm and dry.  Neurological:     General: No focal deficit present.     Mental Status: She is alert.  Psychiatric:        Mood and Affect: Mood normal.    Palliative Assessment/Data: 70%   Thank you for allowing us  to participate in the care of Huntley Demedeiros PMT will continue to support holistically.  I personally spent a total of 75 minutes in the care of the patient today including preparing to see  the patient, getting/reviewing separately obtained history, performing a medically appropriate exam/evaluation, counseling and educating, referring and communicating with other health care professionals, and documenting clinical information in the EHR.  Signed by: Fairy FORBES Shan DEVONNA Palliative Medicine Team  Team Phone # [phone] (Nights/Weekends)  09/29/2024, 10:17 AM

## 2024-09-29 NOTE — Care Management Obs Status (Signed)
 MEDICARE OBSERVATION STATUS NOTIFICATION   Patient Details  Name: Afiya Ferrebee MRN: [id number] Date of Birth: 1935-05-21   Medicare Observation Status Notification Given:  Yes  Obs notice signed and copy given.   Seeley Hissong 09/29/2024, 9:42 AM

## 2024-09-30 ENCOUNTER — Other Ambulatory Visit (HOSPITAL_COMMUNITY): Payer: Self-pay

## 2024-09-30 ENCOUNTER — Encounter (HOSPITAL_COMMUNITY)

## 2024-11-09 ENCOUNTER — Ambulatory Visit (HOSPITAL_COMMUNITY)
Admission: RE | Admit: 2024-11-09 | Discharge: 2024-11-09 | Disposition: A | Discharge status: Home / Self Care | Source: Ambulatory Visit | Attending: Internal Medicine | Admitting: Internal Medicine

## 2024-11-09 VITALS — BP 159/85 | Temp 97.3°F | Resp 16

## 2024-11-09 DIAGNOSIS — E559 Vitamin D deficiency, unspecified: Secondary | ICD-10-CM | POA: Insufficient documentation

## 2024-11-09 DIAGNOSIS — M810 Age-related osteoporosis without current pathological fracture: Secondary | ICD-10-CM | POA: Insufficient documentation

## 2024-11-09 MED ORDER — DENOSUMAB 60 MG/ML ~~LOC~~ SOSY
60.0000 mg | PREFILLED_SYRINGE | Freq: Once | SUBCUTANEOUS | Status: AC
  Administered 2024-11-09: 60 mg via SUBCUTANEOUS

## 2024-11-09 MED ORDER — DENOSUMAB 60 MG/ML ~~LOC~~ SOSY
PREFILLED_SYRINGE | SUBCUTANEOUS | Status: AC
  Filled 2024-11-09: qty 1

## 2025-04-03 ENCOUNTER — Encounter (HOSPITAL_COMMUNITY)

## 2025-05-10 ENCOUNTER — Encounter (HOSPITAL_COMMUNITY)
